# Patient Record
Sex: Female | Born: 1950
Health system: Southern US, Community
[De-identification: ages and names within clinical notes are randomized; demographics above are authoritative.]

## PROBLEM LIST (undated history)

## (undated) DIAGNOSIS — R7303 Prediabetes: Secondary | ICD-10-CM

## (undated) DIAGNOSIS — E785 Hyperlipidemia, unspecified: Secondary | ICD-10-CM

## (undated) DIAGNOSIS — N200 Calculus of kidney: Secondary | ICD-10-CM

## (undated) DIAGNOSIS — R000 Tachycardia, unspecified: Secondary | ICD-10-CM

## (undated) DIAGNOSIS — M8580 Other specified disorders of bone density and structure, unspecified site: Secondary | ICD-10-CM

## (undated) DIAGNOSIS — I8390 Asymptomatic varicose veins of unspecified lower extremity: Secondary | ICD-10-CM

## (undated) HISTORY — DX: Calculus of kidney: N20.0

## (undated) HISTORY — DX: Prediabetes: R73.03

## (undated) HISTORY — DX: Asymptomatic varicose veins of unspecified lower extremity: I83.90

## (undated) HISTORY — PX: ABDOMINAL HYSTERECTOMY: SHX81

## (undated) HISTORY — DX: Other specified disorders of bone density and structure, unspecified site: M85.80

## (undated) HISTORY — DX: Hyperlipidemia, unspecified: E78.5

## (undated) HISTORY — DX: Tachycardia, unspecified: R00.0

---
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
## 2002-10-04 ENCOUNTER — Observation Stay (HOSPITAL_COMMUNITY): Admission: EM | Admit: 2002-10-04 | Discharge: 2002-10-04 | Payer: Self-pay | Admitting: Emergency Medicine

## 2002-10-04 ENCOUNTER — Encounter: Payer: Self-pay | Admitting: Emergency Medicine

## 2004-01-23 ENCOUNTER — Ambulatory Visit (HOSPITAL_COMMUNITY): Admission: RE | Admit: 2004-01-23 | Discharge: 2004-01-23 | Payer: Self-pay | Admitting: Gastroenterology

## 2004-02-07 ENCOUNTER — Ambulatory Visit (HOSPITAL_COMMUNITY): Admission: RE | Admit: 2004-02-07 | Discharge: 2004-02-07 | Payer: Self-pay | Admitting: Obstetrics and Gynecology

## 2004-03-30 HISTORY — PX: OTHER SURGICAL HISTORY: SHX169

## 2008-03-30 HISTORY — PX: OTHER SURGICAL HISTORY: SHX169

## 2009-05-29 ENCOUNTER — Ambulatory Visit (HOSPITAL_COMMUNITY): Admission: RE | Admit: 2009-05-29 | Discharge: 2009-05-29 | Payer: Self-pay | Admitting: Urology

## 2009-06-11 ENCOUNTER — Ambulatory Visit (HOSPITAL_COMMUNITY): Admission: RE | Admit: 2009-06-11 | Discharge: 2009-06-11 | Payer: Self-pay | Admitting: Gastroenterology

## 2010-06-22 LAB — GLUCOSE, CAPILLARY: Glucose-Capillary: 112 mg/dL — ABNORMAL HIGH (ref 70–99)

## 2010-08-15 NOTE — Op Note (Signed)
Bonnie Brady, WANT NO.:  [id number]   MEDICAL RECORD NO.:  [id number]          PATIENT TYPE:  AMB   LOCATION:  ENDO                         FACILITY:  [facility]   PHYSICIAN:  Anselmo Rod, M.D.  DATE OF BIRTH:  06/06/1950   DATE OF PROCEDURE:  01/23/2004  DATE OF DISCHARGE:                                 OPERATIVE REPORT   PROCEDURE:  Screening colonoscopy.   ENDOSCOPIST:  Charna Elizabeth, M.D.   INSTRUMENT USED:  Olympus video colonoscope.   INDICATIONS FOR PROCEDURE:  60 year old white female with a history of  guaiac positive stool undergoing screening colonoscopy.  The patient has a  history of occasional diarrhea, rule out colonic polyps, masses, etc.   PREPROCEDURE PREPARATION:  Informed consent was obtained from the patient.  The patient was fasted for eight hours prior to the procedure and prepped  with a bottle of magnesium citrate and a gallon of GoLYTELY the night prior  to the procedure.   PREPROCEDURE PHYSICAL:  Patient with stable vital signs.  Neck supple.  Chest clear to auscultation.  S1 and S2 regular.  Abdomen soft with normal  bowel sounds.   DESCRIPTION OF PROCEDURE:  The patient was placed in the left lateral  decubitus position, sedated with 60 mg of Demerol and 7.5 mg Versed in slow  incremental doses.  Once the patient was adequately sedated, maintained on  low flow oxygen and continuous cardiac monitoring, the Olympus video  colonoscope was advanced from the rectum to the cecum.  Small internal  hemorrhoids were seen on retroflexion.  There were a few early left sided  diverticula, no masses or polyps were identified.  The appendiceal orifice  and ileocecal valve were clearly visualized and photographed.  The patient  tolerated the procedure well without complications.   IMPRESSION:  1.  Small internal hemorrhoids.  2.  Left sided diverticulosis (early).  3.  No masses or polyps seen.   RECOMMENDATIONS:  1.  Continue a high  fiber diet with liberal fluid intake.  2.  Repeat colonoscopy in the next ten years unless the patient develops any      abnormal symptoms in the interim.  3.  Repeat guaiacs on an outpatient basis.  4.  Brochures on diverticulosis are to be given to the patient for      education.       JNM/MEDQ  D:  01/23/2004  T:  01/23/2004  Job:  [id number]   cc:   Maxie Better, M.D.  [address]  [place]  [place] [address]  Fax: [phone]

## 2010-08-15 NOTE — H&P (Signed)
NAMEJEWELL, HAUGHT NO.:  [id number]   MEDICAL RECORD NO.:  [id number]          PATIENT TYPE:  AMB   LOCATION:  DAY                          FACILITY:  [facility]   PHYSICIAN:  Maxie Better, M.D.DATE OF BIRTH:  1950-07-22   DATE OF ADMISSION:  02/07/2004  DATE OF DISCHARGE:                                HISTORY & PHYSICAL   CHIEF COMPLAINT:  Urinary incontinence, cystocele.   HISTORY OF PRESENT ILLNESS:  A 60 year old, gravida 2, para 2, married white  female, postmenopausal, not on hormone replacement therapy, who is status  post a total vaginal hysterectomy and A&P repair secondary to uterine  prolapse in 1982, who is now being admitted for surgical management of urine  incontinence and cystocele.  The patient was evaluated by Dr. Alexis Frock.  She is now being admitted for pubovaginal sling and anterior  repair.   PAST MEDICAL HISTORY:   ALLERGIES:  POWDERED GLOVES WITH LATEX.   MEDICAL HISTORY:  1.  Hypercholesterolemia.  2.  History of angina with a negative cath.   SURGICAL HISTORY:  1.  Breast cyst aspiration.  2.  Appendectomy.  3.  Total vaginal hysterectomy.  4.  Anterior and posterior repair in 1982.  5.  Cesarean section x 2.   OBSTETRICAL HISTORY:  C-section x 2.   FAMILY HISTORY:  Breast cancer paternal grandmother, no ovarian or colon  cancer.  Mother died of a CVA and hypertension.  Father died of asbestos.   SOCIAL HISTORY:  Married, two children.  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   REVIEW OF SYSTEMS:  Negative except for loss or urine with walking or  sneezing.  Some urgency.   PHYSICAL EXAMINATION:   PHYSICAL EXAMINATION:  GENERAL:  Well-developed, well-nourished white female  in no acute distress.  VITAL SIGNS:  Blood pressure 110/70,  Weight is 134.  Height is 5 feet 7/8  inches.  SKIN:  No lesions.  HEENT:  Anicteric sclerae.  Pink conjunctivae.  Oropharynx negative.  HEART:  Regular rate and rhythm  without murmur.  LUNGS:  Clear to auscultation.  NECK:  Supple.  Thyroid not palpable.  NODES:  No supraclavicular, axillary nodes palpable.  ABDOMEN:  Soft, nontender.  Healed transverse and low vertical scars.  BACK:  No CVA tenderness.  BREASTS:  Well-circumscribed bilateral cystic lumps, one at 12 o'clock on  the right and the other one on the left at 3 o'clock.  PELVIC:  Vulva shows no lesions.  Vagina had a small cystocele.  Surgical  absence of cervix and uterus.  Adnexa nontender, no palpable mass.  RECTAL:  Normal tone.  Negative rectovaginal septum.  Soft stool.   IMPRESSION:  1.  Cystocele.  2.  Stress urinary incontinence.   PLAN:  1.  Admission.  2.  Anterior repair.  3.  Pubovaginal sling by Dr. Patsi Sears.   Risks of the procedure explained to the patient including, but not limited  to, infection, injury to bladder, surrounding organ structure, urinary  retention, possible need for this same surgery in the future, bleeding.  All  questions answered.  Routine admission labs planned.      [place]/MEDQ  D:  02/06/2004  T:  02/06/2004  Job:  [id number]

## 2010-08-15 NOTE — Op Note (Signed)
NAMECHELSEY, [name]:  [id number]   MEDICAL RECORD NO.:  [id number]          PATIENT TYPE:  AMB   LOCATION:  DAY                          FACILITY:  [facility]   PHYSICIAN:  Sigmund I. Patsi Sears, M.D.DATE OF BIRTH:  [birth year]   DATE OF PROCEDURE:  02/07/2004  DATE OF DISCHARGE:                                 OPERATIVE REPORT   PREOPERATIVE DIAGNOSES:  Urinary incontinence.   POSTOPERATIVE DIAGNOSES:  Urinary incontinence.   OPERATION:  [facility] AMS pubovaginal transobturator sling.   SURGEON:  Sigmund I. Patsi Sears, M.D.   FIRST ASSISTANT:  Maxie Better, M.D.   PREPARATION:  With the patient in the dorsal lithotomy position, following  anterior vaginal vault repair, the urethra was anesthetized with 3 mL of 1%  Xylocaine with 1:100,000 epinephrine. A midline incision was then made over  the urethra, subcutaneous tissue was dissected bilaterally to the level of  the retropubic fascia. Two separate stab wounds were then made 5 cm lateral  to the clitoris, at the level of the obturator bone.  In standard fashion,  the anus transobturator [facility] pubovaginal sling is placed without  difficulty.  With the right angled clamp behind it, each wing is then cut,  and antibiotic solution introduced. Each wing is then removed and prior to  cutting the wings, cystoscopy is accomplished, and a normal bladder is  found. There is no evidence of any trauma to the bladder. Foley catheters  were placed, the wings cut subcutaneously. The wound is closed in two layers  with interrupted 3-0 Vicryl suture. Foley catheter remained in place, but  will be removed in the recovery room.  The patient was awakened and was  given IV Toradol, taken to the recovery room in good condition.     Sigm   SIT/MEDQ  D:  02/07/2004  T:  02/07/2004  Job:  [id number]   cc:   Maxie Better, M.D.  [address]  [place]  [place] [address]  Fax: [phone]

## 2010-08-15 NOTE — Op Note (Signed)
NAMEEADIE, REPETTO NO.:  [id number]   MEDICAL RECORD NO.:  [id number]          PATIENT TYPE:  AMB   LOCATION:  DAY                          [facility]ACILITY[facility]  [facility]   PHYSICIAN:  Maxie Better, M.D.DATE OF BIRTH:  [birth year]   DATE OF PROCEDURE:  02/07/2004  DATE OF DISCHARGE:                                 OPERATIVE REPORT   PREOPERATIVE DIAGNOSIS:  Cystocele, stress urinary incontinence.   PROCEDURE:  Anterior colporrhaphy.   POSTOPERATIVE DIAGNOSIS:  Cystocele, stress urinary incontinence.   ANESTHESIA:  General.   SURGEON:  Maxie Better, M.D.   ASSISTANT:  Gerri Spore B. Earlene Plater, M.D.   PROCEDURE:  Under adequate general anesthesia, the patient was placed in a  dorsal lithotomy position.  She was sterilely prepped and draped in the  usual fashion.  An indwelling Foley catheter was thoroughly placed.  First,  a second-degree cystocele was noted in the posterior aspect of the anterior  wall of the vagina.  Surgical absence of cervix and uterus was noted.  The  lateral dimples of the vaginal cuff was identified.  Lidocaine 1% with  epinephrine was then injected transversely and vertically.  A transverse  incision was made between the dimple of the vaginal cuff and then a vertical  incision was made anteriorly from there overlying the cystocele.  The  bladder was then bluntly dissected off the vaginal mucosa laterally on both  sides.  When this was done, the vertical incision was carried up to about  1.5 cm from the urethra in order for Dr. Patsi Sears to do his portion of the  procedure.  The endofascia was approximated in the midline using 0 Vicryl  sutures.  The ex-redundant vaginal mucosa was then cut, and the vaginal  mucosa was then approximated using 0 Monocryl interrupted figure-of-eight  sutures and with transverse closure, again, of the vaginal cuff anteriorly.  At that point, at the completion of the procedure from the anterior repair,  Dr.  Patsi Sears proceed with his pubovaginal sling.  Please see his dictated  operative note for the details.   SPECIMENS:  Vaginal mucosa, not sent to pathology.   ESTIMATED BLOOD LOSS:  Minimal.   COMPLICATIONS:  None.   SPONGE COUNT:  Correct.   Patient tolerated the procedure well.  Was transferred to the recovery room  in stable condition.      [place]/MEDQ  D:  02/07/2004  T:  02/07/2004  Job:  [id number]

## 2010-08-15 NOTE — Discharge Summary (Signed)
NAME:  Bonnie Brady, Bonnie Brady                         ACCOUNT NO.:  [id number]   MEDICAL RECORD NO.:  [id number]                   PATIENT TYPE:  INP   LOCATION:  3727                                 FACILITY:  [facility]   PHYSICIAN:  Bonnie Brady, M.D.             DATE OF BIRTH:  11/13/1950   DATE OF ADMISSION:  10/04/2002  DATE OF DISCHARGE:  10/04/2002                                 DISCHARGE SUMMARY   DISCHARGE DIAGNOSES:  1. Chest pain, rule out cardiac.     a. Nonobstructive coronary artery disease.  2. Tobacco use.  3. Possible hyperlipidemia.  4. Tachycardia, resolved.  5. Hypotension, mild, resolved.  6. History of esophagitis.  7. History of a superficial vein thrombus with first pregnancy.   DISCHARGE CONDITION:  Improved.   PROCEDURES:  Combined left heart catheterization October 04, 2002 by Dr. Lenise Brady.   DISCHARGE MEDICATIONS:  1. Wellbutrin SR 150 mg 1 daily for three days then 1 twice a day.  2. Nexium 40 mg 1 daily.  3. Enteric-coated aspirin daily.  4. No smoking after #10 on Wellbutrin.   No strenuous activity, no sexual activity, no lifting over 10 pounds for  three days then may resume regular activity.   Low-fat/low-salt diet.   May take dressing off groin in the morning.  Wash catheterization site in  shower with soap and water.  If any bleeding, swelling, or drainage occurs  please call.  Do not take a sit-down bath for one week.  Do not swim for one  week.   Follow up with Dr. Jenne Brady October 24, 2002 at 4:30 p.m.   HISTORY OF PRESENT ILLNESS:  A 60 year old white female with no prior  cardiac history and no significant medical history with positive tobacco use  presented to the emergency room October 04, 2002 with complaints of chest pain.   She was awakened at 5 a.m. with chest pain, tightness, and midsternal chest  burning sensation radiating from that area.  It is different from the  burping she normally has with esophagitis.  No radiation.   The burning  continued the entire time.  The tightness would come and go.  She kept  hoping that the symptoms would resolve but soon after onset of chest pain  heart rate went up and no associated symptoms of shortness of breath,  diaphoresis, nausea, or vomiting.  She took two aspirin, came to the  emergency room, took a sublingual nitroglycerin in the ER, and tightness  resolved.  The burning sensation continued.   Over the prior couple of weeks, similar symptoms but less duration and not  occurred with exertion.   PAST MEDICAL HISTORY:  1. Possible hyperlipidemia  2. History of T&A.  3. History of C-section x2.  4. Superficial phlebitis with first pregnancy.  5. History of hysterectomy.  6. Hysterectomy of appendectomy.   OUTPATIENT MEDICATIONS:  Multivitamins.  No herbs,  diet pills, etc.   ALLERGIES:  AMPICILLIN.   FAMILY HISTORY/SOCIAL HISTORY/REVIEW OF SYSTEMS:  See H&P.   PHYSICAL EXAMINATION AT DISCHARGE:  VITAL SIGNS:  Blood pressure 110/88,  pulse 80, respirations 16, temperature 97.6, room air oxygen saturation 97%.  GENERAL:  Alert, oriented female.  No acute distress.  SKIN:  Warm and dry.  LUNGS:  Clear without rales, rhonchi, or wheezes.  EXTREMITIES:  Pedal pulses are present.  No hematoma at right groin  catheterization site.   LABORATORY DATA:  Sodium 139, potassium 3.7, BUN 11, creatinine 0.9, glucose  127.  CK 164, MB 2.2, troponin 0.02.   EKG:  Sinus tachycardia at 103, nonspecific ST-T wave changes, poor anterior  R-wave progression.   Cardiac catheterization revealed nonobstructive coronary disease, normal LV  systolic function, no evidence of aortic dissection, successful closure of  the right groin using a Perclose device and 1 g of Ancef was given  prophylactically.  She had 40% stenosis in the mid LAD.   HOSPITAL COURSE:  The patient came to the emergency room October 04, 2002, was  seen by Dr. Jenne Brady in the emergency room due to her history of  chest pain.  It was extremely worrisome for angina.  She was able to go to the cardiac  catheterization lab electively for cardiac catheterization.  Procedure  without complications, stated results as above.  She then went to short-stay  and was discharged home without further complications.  She will follow up  as an outpatient.  Discharged by Dr. Jenne Brady.      Bonnie Brady. Bonnie Brady, M.D.    LRI/MEDQ  D:  11/14/2002  T:  11/15/2002  Job:  [id number]   cc:   Dr. Samson Brady

## 2010-08-15 NOTE — Cardiovascular Report (Signed)
NAME:  Bonnie Brady, Bonnie Brady                         ACCOUNT NO.:  [id number]   MEDICAL RECORD NO.:  [id number]                   PATIENT TYPE:  INP   LOCATION:  3727                                 FACILITY:  [facility]   PHYSICIAN:  Darlin Priestly, M.D.             DATE OF BIRTH:  06/11/1950   DATE OF PROCEDURE:  10/04/2002  DATE OF DISCHARGE:                              CARDIAC CATHETERIZATION   PROCEDURES PERFORMED:  1. Left heart catheterization.  2. Coronary angiography.  3. Left ventriculogram.  4. Ascending aortography.   CARDIOLOGIST:  Darlin Priestly, M.D.   COMPLICATIONS:  None.   INDICATIONS:  Bonnie Brady is a 60 year old female who presented to the ER on  October 04, 2002 with the complaint of substernal chest pain, which awoke her  at 7 A.M.  Chest pain did radiate across her chest, but did not migrate into  her neck, arm, jaw or shoulder.  She had no associated shortness of breath,  nausea or diaphoresis with this episode.  She subsequently presented to the  [facility] ER where she was noted to have septal P waves and was given  sublingual nitro with relief of her symptoms.  She is now referred for  cardiac catheterization to assess her coronary status.   The patient does have a history of longstanding tobacco use.   DESCRIPTION OF PROCEDURE:  After getting informed written consent the  patient was brought to the cardiac cath lab.  The right and left groins were  shaved, prepped and draped in the usual sterile fashion.  ECG monitoring was  established.  Using the modified Seldinger technique a #6 French arterial  sheath was inserted into the right femoral artery.  A 6 French  diagnostic  catheter was used to perform diagnostic angiography.  This revealed a large  left main with no significant disease.   The LAD is a large vessel, which courses the apex and gives rise to two  diagonal branches.  The LAD is noted to have some mild 40% mid and early  distal stenosis.   The first diagonal is a medium-sized vessel, which  bifurcates distally and has a 40% ostial lesion.  The second diagonal is a  medium-sized vessel, which bifurcates distally and has 40% ostial stenosis.   The left circumflex is a medium-sized vessel, which courses the AV groove  and gives rise to three obtuse marginal branches.  The AV groove circ has no  significant disease.  The first OM is a small vessel with no significant  disease.  The second OM is a medium-size vessel, which bifurcates in the mid  segment and has no significant disease.  The third OM is a small vessel with  no significant disease.   The right coronary artery is a large vessel, which is dominant and gives  rise to both the PDA as well as the posterolateral branch.  There is no  significant disease in the RCA, PDA or posterolateral branch.   Left ventriculogram reveals a preserved EF of 50%.   Ascending aortography reveals no evidence of aortic dissection or  significant aortic regurgitation.   HEMODYNAMIC DATA:  Systemic arterial pressure 119/73.  LV systemic pressure  117/3.  LVEDP of 9.   Following the case the right femoral site was then closed using the Perclose  device without complications.  One gram of Ancef was given prophylactically.   CONCLUSIONS:  1. No significant coronary artery disease.  2. Normal left ventricular systolic function.  3. No evidence of aortic dissection.  4. Successful closure of the right groin using a Perclose device with 1 gram     of Ancef prophylactically.                                                 Darlin Priestly, M.D.    RHM/MEDQ  D:  10/04/2002  T:  10/04/2002  Job:  [id number]

## 2011-11-16 ENCOUNTER — Encounter: Payer: Self-pay | Admitting: Family

## 2011-11-16 ENCOUNTER — Ambulatory Visit (INDEPENDENT_AMBULATORY_CARE_PROVIDER_SITE_OTHER): Payer: 59 | Admitting: Family

## 2011-11-16 VITALS — BP 122/82 | HR 84 | Temp 97.9°F | Resp 14 | Ht 66.75 in | Wt 173.8 lb

## 2011-11-16 DIAGNOSIS — R9431 Abnormal electrocardiogram [ECG] [EKG]: Secondary | ICD-10-CM

## 2011-11-16 DIAGNOSIS — Z0000 Encounter for general adult medical examination without abnormal findings: Secondary | ICD-10-CM | POA: Insufficient documentation

## 2011-11-16 DIAGNOSIS — Z23 Encounter for immunization: Secondary | ICD-10-CM

## 2011-11-16 DIAGNOSIS — Z2911 Encounter for prophylactic immunotherapy for respiratory syncytial virus (RSV): Secondary | ICD-10-CM

## 2011-11-16 NOTE — Progress Notes (Signed)
Subjective:    Patient ID: Bonnie Brady, female    DOB: 05-May-1950, 61 y.o.   MRN: [id number]  HPI  Pt presents today to establish care.  Preventative-  + exercise, total gym and walking.  Diet is "terrible."  Eats too much junk food.  Had Mammogram 1/13 per GYN. Thinks last tetanus was 05/2003.    Hyperlipidemia- reports that she was on lipitor once. Tolerated.    Kidney stones- reports one  Kidney stone in 1979.   Pt had hysterectomy in her 30's.  Due to Dysfunctional Uterine bleeding.   Hx of basal joint arthritis- sees Dr. Amanda Pea Review of Systems  Constitutional: Negative for unexpected weight change.  HENT: Negative for hearing loss and congestion.   Eyes: Negative for visual disturbance.  Respiratory: Negative for cough and shortness of breath.   Cardiovascular: Negative for chest pain and leg swelling.  Gastrointestinal: Negative for nausea, vomiting, diarrhea and blood in stool.  Genitourinary: Negative for dysuria and frequency.  Musculoskeletal: Negative for myalgias and arthralgias.  Skin: Negative for rash.  Neurological: Negative for headaches.  Hematological: Negative for adenopathy.  Psychiatric/Behavioral:       Denies depression/anxiety   No past medical history on file.  History   Social History  . Marital Status: Married    Spouse Name: N/A    Number of Children: N/A  . Years of Education: N/A   Occupational History  . Not on file.   Social History Main Topics  . Smoking status: Former Smoker    Types: Cigarettes    Quit date: 03/30/2008  . Smokeless tobacco: Not on file  . Alcohol Use: Not on file  . Drug Use: Not on file  . Sexually Active: Not on file   Other Topics Concern  . 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 Married2 childrenEnjoys geneologyCamping/hiking    Past Surgical History  Procedure Date  . Bladder strap 2006  . Stripping varicose veins 2010    . Tonsillectomy 1958  . Appendectomy 1971  . Cesarean section 1978 and 1979    Family History  Problem Relation Age of Onset  . Breast cancer Paternal Grandmother   . Stroke Mother   . Stroke Maternal Grandfather     Allergies  Allergen Reactions  . Ampicillin Hives    Questionable.  . Latex Other (See Comments)    Contact with powder.    No current outpatient prescriptions on file prior to visit.    BP 122/82  Pulse 84  Temp 97.9 F (36.6 C) (Oral)  Resp 14  Ht 5' 6.75" (1.695 m)  Wt 173 lb 12 oz (78.812 kg)  BMI 27.42 kg/m2  SpO2 96%       Objective:   Physical Exam  Physical Exam  Constitutional: She is oriented to person, place, and time. She appears well-developed and well-nourished. No distress.  HENT:  Head: Normocephalic and atraumatic.  Right Ear: Tympanic membrane and ear canal normal.  Left Ear: Tympanic membrane and ear canal normal.  Mouth/Throat: Oropharynx is clear and moist.  Eyes: Pupils are equal, round, and reactive to light. No scleral icterus.  Neck: Normal range of motion. No thyromegaly present.  Cardiovascular: Normal rate and regular rhythm.   No murmur heard. Pulmonary/Chest: Effort normal and breath sounds normal. No respiratory distress. He has no wheezes. She has no rales. She exhibits no tenderness.  Abdominal: Soft. Bowel sounds are normal.  He exhibits no distension and no mass. There is no tenderness. There is no rebound and no guarding.  Musculoskeletal: She exhibits no edema.  Lymphadenopathy:    She has no cervical adenopathy.  Neurological: She is alert and oriented to person, place, and time. She has normal reflexes. She exhibits normal muscle tone. Coordination normal.  Skin: Skin is warm and dry.  Psychiatric: She has a normal mood and affect. Her behavior is normal. Judgment and thought content normal.  Breast/pelvic- deferred to gyn         Assessment & Plan:         Assessment & Plan:

## 2011-11-16 NOTE — Assessment & Plan Note (Signed)
EKG is personally reviewed.  Noted ST elevation in V1 only.  Pt is completely asymptomatic.  No EKG available for comparison.  Will order stress test.

## 2011-11-16 NOTE — Patient Instructions (Addendum)
Please return fasting for lab work.    Preventive Care for Adults, Female A healthy lifestyle and preventive care can promote health and wellness. Preventive health guidelines for women include the following key practices.  A routine yearly physical is a good way to check with your caregiver about your health and preventive screening. It is a chance to share any concerns and updates on your health, and to receive a thorough exam.   Visit your dentist for a routine exam and preventive care every 6 months. Brush your teeth twice a day and floss once a day. Good oral hygiene prevents tooth decay and gum disease.   The frequency of eye exams is based on your age, health, family medical history, use of contact lenses, and other factors. Follow your caregiver's recommendations for frequency of eye exams.   Eat a healthy diet. Foods like vegetables, fruits, whole grains, low-fat dairy products, and lean protein foods contain the nutrients you need without too many calories. Decrease your intake of foods high in solid fats, added sugars, and salt. Eat the right amount of calories for you.Get information about a proper diet from your caregiver, if necessary.   Regular physical exercise is one of the most important things you can do for your health. Most adults should get at least 150 minutes of moderate-intensity exercise (any activity that increases your heart rate and causes you to sweat) each week. In addition, most adults need muscle-strengthening exercises on 2 or more days a week.   Maintain a healthy weight. The body mass index (BMI) is a screening tool to identify possible weight problems. It provides an estimate of body fat based on height and weight. Your caregiver can help determine your BMI, and can help you achieve or maintain a healthy weight.For adults 20 years and older:   A BMI below 18.5 is considered underweight.   A BMI of 18.5 to 24.9 is normal.   A BMI of 25 to 29.9 is considered  overweight.   A BMI of 30 and above is considered obese.   Maintain normal blood lipids and cholesterol levels by exercising and minimizing your intake of saturated fat. Eat a balanced diet with plenty of fruit and vegetables. Blood tests for lipids and cholesterol should begin at age 69 and be repeated every 5 years. If your lipid or cholesterol levels are high, you are over 50, or you are at high risk for heart disease, you may need your cholesterol levels checked more frequently.Ongoing high lipid and cholesterol levels should be treated with medicines if diet and exercise are not effective.   If you smoke, find out from your caregiver how to quit. If you do not use tobacco, do not start.   If you are pregnant, do not drink alcohol. If you are breastfeeding, be very cautious about drinking alcohol. If you are not pregnant and choose to drink alcohol, do not exceed 1 drink per day. One drink is considered to be 12 ounces (355 mL) of beer, 5 ounces (148 mL) of wine, or 1.5 ounces (44 mL) of liquor.   Avoid use of street drugs. Do not share needles with anyone. Ask for help if you need support or instructions about stopping the use of drugs.   High blood pressure causes heart disease and increases the risk of stroke. Your blood pressure should be checked at least every 1 to 2 years. Ongoing high blood pressure should be treated with medicines if weight loss and exercise are not  effective.   If you are 51 to 60 years old, ask your caregiver if you should take aspirin to prevent strokes.   Diabetes screening involves taking a blood sample to check your fasting blood sugar level. This should be done once every 3 years, after age 64, if you are within normal weight and without risk factors for diabetes. Testing should be considered at a younger age or be carried out more frequently if you are overweight and have at least 1 risk factor for diabetes.   Breast cancer screening is essential preventive  care for women. You should practice "breast self-awareness." This means understanding the normal appearance and feel of your breasts and may include breast self-examination. Any changes detected, no matter how small, should be reported to a caregiver. Women in their 24s and 30s should have a clinical breast exam (CBE) by a caregiver as part of a regular health exam every 1 to 3 years. After age 26, women should have a CBE every year. Starting at age 37, women should consider having a mammography (breast X-ray test) every year. Women who have a family history of breast cancer should talk to their caregiver about genetic screening. Women at a high risk of breast cancer should talk to their caregivers about having magnetic resonance imaging (MRI) and a mammography every year.   The Pap test is a screening test for cervical cancer. A Pap test can show cell changes on the cervix that might become cervical cancer if left untreated. A Pap test is a procedure in which cells are obtained and examined from the lower end of the uterus (cervix).   Women should have a Pap test starting at age 8.   Between ages 31 and 84, Pap tests should be repeated every 2 years.   Beginning at age 78, you should have a Pap test every 3 years as long as the past 3 Pap tests have been normal.   Some women have medical problems that increase the chance of getting cervical cancer. Talk to your caregiver about these problems. It is especially important to talk to your caregiver if a new problem develops soon after your last Pap test. In these cases, your caregiver may recommend more frequent screening and Pap tests.   The above recommendations are the same for women who have or have not gotten the vaccine for human papillomavirus (HPV).   If you had a hysterectomy for a problem that was not cancer or a condition that could lead to cancer, then you no longer need Pap tests. Even if you no longer need a Pap test, a regular exam is a  good idea to make sure no other problems are starting.   If you are between ages 70 and 34, and you have had normal Pap tests going back 10 years, you no longer need Pap tests. Even if you no longer need a Pap test, a regular exam is a good idea to make sure no other problems are starting.   If you have had past treatment for cervical cancer or a condition that could lead to cancer, you need Pap tests and screening for cancer for at least 20 years after your treatment.   If Pap tests have been discontinued, risk factors (such as a new sexual partner) need to be reassessed to determine if screening should be resumed.   The HPV test is an additional test that may be used for cervical cancer screening. The HPV test looks for the virus  that can cause the cell changes on the cervix. The cells collected during the Pap test can be tested for HPV. The HPV test could be used to screen women aged 68 years and older, and should be used in women of any age who have unclear Pap test results. After the age of 51, women should have HPV testing at the same frequency as a Pap test.   Colorectal cancer can be detected and often prevented. Most routine colorectal cancer screening begins at the age of 70 and continues through age 48. However, your caregiver may recommend screening at an earlier age if you have risk factors for colon cancer. On a yearly basis, your caregiver may provide home test kits to check for hidden blood in the stool. Use of a small camera at the end of a tube, to directly examine the colon (sigmoidoscopy or colonoscopy), can detect the earliest forms of colorectal cancer. Talk to your caregiver about this at age 44, when routine screening begins. Direct examination of the colon should be repeated every 5 to 10 years through age 76, unless early forms of pre-cancerous polyps or small growths are found.   Hepatitis C blood testing is recommended for all people born from [birth year] through 1965 and any  individual with known risks for hepatitis C.   Practice safe sex. Use condoms and avoid high-risk sexual practices to reduce the spread of sexually transmitted infections (STIs). STIs include gonorrhea, chlamydia, syphilis, trichomonas, herpes, HPV, and human immunodeficiency virus (HIV). Herpes, HIV, and HPV are viral illnesses that have no cure. They can result in disability, cancer, and death. Sexually active women aged 53 and younger should be checked for chlamydia. Older women with new or multiple partners should also be tested for chlamydia. Testing for other STIs is recommended if you are sexually active and at increased risk.   Osteoporosis is a disease in which the bones lose minerals and strength with aging. This can result in serious bone fractures. The risk of osteoporosis can be identified using a bone density scan. Women ages 66 and over and women at risk for fractures or osteoporosis should discuss screening with their caregivers. Ask your caregiver whether you should take a calcium supplement or vitamin D to reduce the rate of osteoporosis.   Menopause can be associated with physical symptoms and risks. Hormone replacement therapy is available to decrease symptoms and risks. You should talk to your caregiver about whether hormone replacement therapy is right for you.   Use sunscreen with sun protection factor (SPF) of 30 or more. Apply sunscreen liberally and repeatedly throughout the day. You should seek shade when your shadow is shorter than you. Protect yourself by wearing long sleeves, pants, a wide-brimmed hat, and sunglasses year round, whenever you are outdoors.   Once a month, do a whole body skin exam, using a mirror to look at the skin on your back. Notify your caregiver of new moles, moles that have irregular borders, moles that are larger than a pencil eraser, or moles that have changed in shape or color.   Stay current with required immunizations.   Influenza. You need a  dose every fall (or winter). The composition of the flu vaccine changes each year, so being vaccinated once is not enough.   Pneumococcal polysaccharide. You need 1 to 2 doses if you smoke cigarettes or if you have certain chronic medical conditions. You need 1 dose at age 58 (or older) if you have never been vaccinated.  Tetanus, diphtheria, pertussis (Tdap, Td). Get 1 dose of Tdap vaccine if you are younger than age 41, are over 31 and have contact with an infant, are a Research scientist (physical sciences), are pregnant, or simply want to be protected from whooping cough. After that, you need a Td booster dose every 10 years. Consult your caregiver if you have not had at least 3 tetanus and diphtheria-containing shots sometime in your life or have a deep or dirty wound.   HPV. You need this vaccine if you are a woman age 86 or younger. The vaccine is given in 3 doses over 6 months.   Measles, mumps, rubella (MMR). You need at least 1 dose of MMR if you were born in 1957 or later. You may also need a second dose.   Meningococcal. If you are age 7 to 21 and a first-year college student living in a residence hall, or have one of several medical conditions, you need to get vaccinated against meningococcal disease. You may also need additional booster doses.   Zoster (shingles). If you are age 54 or older, you should get this vaccine.   Varicella (chickenpox). If you have never had chickenpox or you were vaccinated but received only 1 dose, talk to your caregiver to find out if you need this vaccine.   Hepatitis A. You need this vaccine if you have a specific risk factor for hepatitis A virus infection or you simply wish to be protected from this disease. The vaccine is usually given as 2 doses, 6 to 18 months apart.   Hepatitis B. You need this vaccine if you have a specific risk factor for hepatitis B virus infection or you simply wish to be protected from this disease. The vaccine is given in 3 doses, usually over  6 months.  Preventive Services / Frequency Ages 28 to 67  Blood pressure check.** / Every 1 to 2 years.   Lipid and cholesterol check.** / Every 5 years beginning at age 55.   Clinical breast exam.** / Every 3 years for women in their 56s and 30s.   Pap test.** / Every 2 years from ages 70 through 45. Every 3 years starting at age 73 through age 9 or 39 with a history of 3 consecutive normal Pap tests.   HPV screening.** / Every 3 years from ages 60 through ages 72 to 37 with a history of 3 consecutive normal Pap tests.   Hepatitis C blood test.** / For any individual with known risks for hepatitis C.   Skin self-exam. / Monthly.   Influenza immunization.** / Every year.   Pneumococcal polysaccharide immunization.** / 1 to 2 doses if you smoke cigarettes or if you have certain chronic medical conditions.   Tetanus, diphtheria, pertussis (Tdap, Td) immunization. / A one-time dose of Tdap vaccine. After that, you need a Td booster dose every 10 years.   HPV immunization. / 3 doses over 6 months, if you are 65 and younger.   Measles, mumps, rubella (MMR) immunization. / You need at least 1 dose of MMR if you were born in 1957 or later. You may also need a second dose.   Meningococcal immunization. / 1 dose if you are age 18 to 28 and a first-year college student living in a residence hall, or have one of several medical conditions, you need to get vaccinated against meningococcal disease. You may also need additional booster doses.   Varicella immunization.** / Consult your caregiver.   Hepatitis A immunization.** /  Consult your caregiver. 2 doses, 6 to 18 months apart.   Hepatitis B immunization.** / Consult your caregiver. 3 doses usually over 6 months.  Ages 57 to 46  Blood pressure check.** / Every 1 to 2 years.   Lipid and cholesterol check.** / Every 5 years beginning at age 52.   Clinical breast exam.** / Every year after age 46.   Mammogram.** / Every year beginning  at age 11 and continuing for as long as you are in good health. Consult with your caregiver.   Pap test.** / Every 3 years starting at age 81 through age 46 or 35 with a history of 3 consecutive normal Pap tests.   HPV screening.** / Every 3 years from ages 83 through ages 36 to 62 with a history of 3 consecutive normal Pap tests.   Fecal occult blood test (FOBT) of stool. / Every year beginning at age 3 and continuing until age 57. You may not need to do this test if you get a colonoscopy every 10 years.   Flexible sigmoidoscopy or colonoscopy.** / Every 5 years for a flexible sigmoidoscopy or every 10 years for a colonoscopy beginning at age 52 and continuing until age 97.   Hepatitis C blood test.** / For all people born from [birth year] through 1965 and any individual with known risks for hepatitis C.   Skin self-exam. / Monthly.   Influenza immunization.** / Every year.   Pneumococcal polysaccharide immunization.** / 1 to 2 doses if you smoke cigarettes or if you have certain chronic medical conditions.   Tetanus, diphtheria, pertussis (Tdap, Td) immunization.** / A one-time dose of Tdap vaccine. After that, you need a Td booster dose every 10 years.   Measles, mumps, rubella (MMR) immunization. / You need at least 1 dose of MMR if you were born in 1957 or later. You may also need a second dose.   Varicella immunization.** / Consult your caregiver.   Meningococcal immunization.** / Consult your caregiver.   Hepatitis A immunization.** / Consult your caregiver. 2 doses, 6 to 18 months apart.   Hepatitis B immunization.** / Consult your caregiver. 3 doses, usually over 6 months.  Ages 81 and over  Blood pressure check.** / Every 1 to 2 years.   Lipid and cholesterol check.** / Every 5 years beginning at age 62.   Clinical breast exam.** / Every year after age 31.   Mammogram.** / Every year beginning at age 58 and continuing for as long as you are in good health. Consult with your  caregiver.   Pap test.** / Every 3 years starting at age 95 through age 16 or 75 with a 3 consecutive normal Pap tests. Testing can be stopped between 65 and 70 with 3 consecutive normal Pap tests and no abnormal Pap or HPV tests in the past 10 years.   HPV screening.** / Every 3 years from ages 79 through ages 33 or 63 with a history of 3 consecutive normal Pap tests. Testing can be stopped between 65 and 70 with 3 consecutive normal Pap tests and no abnormal Pap or HPV tests in the past 10 years.   Fecal occult blood test (FOBT) of stool. / Every year beginning at age 85 and continuing until age 34. You may not need to do this test if you get a colonoscopy every 10 years.   Flexible sigmoidoscopy or colonoscopy.** / Every 5 years for a flexible sigmoidoscopy or every 10 years for a colonoscopy beginning at age  50 and continuing until age 66.   Hepatitis C blood test.** / For all people born from [birth year] through 1965 and any individual with known risks for hepatitis C.   Osteoporosis screening.** / A one-time screening for women ages 57 and over and women at risk for fractures or osteoporosis.   Skin self-exam. / Monthly.   Influenza immunization.** / Every year.   Pneumococcal polysaccharide immunization.** / 1 dose at age 64 (or older) if you have never been vaccinated.   Tetanus, diphtheria, pertussis (Tdap, Td) immunization. / A one-time dose of Tdap vaccine if you are over 65 and have contact with an infant, are a Research scientist (physical sciences), or simply want to be protected from whooping cough. After that, you need a Td booster dose every 10 years.   Varicella immunization.** / Consult your caregiver.   Meningococcal immunization.** / Consult your caregiver.   Hepatitis A immunization.** / Consult your caregiver. 2 doses, 6 to 18 months apart.   Hepatitis B immunization.** / Check with your caregiver. 3 doses, usually over 6 months.  ** Family history and personal history of risk and conditions  may change your caregiver's recommendations. Document Released: 05/12/2001 Document Revised: 03/05/2011 Document Reviewed: 08/11/2010 [facility] Patient Information 2012 [place], [place].

## 2011-11-16 NOTE — Assessment & Plan Note (Signed)
Pt counseled on healthy diet, exercise weight loss.  Zostavax today. EKG today.  Pt to return fasting for lab work .

## 2011-11-18 LAB — HEPATIC FUNCTION PANEL
ALT: 21 U/L (ref 0–35)
AST: 18 U/L (ref 0–37)
Albumin: 4.4 g/dL (ref 3.5–5.2)
Alkaline Phosphatase: 91 U/L (ref 39–117)
Bilirubin, Direct: 0.1 mg/dL (ref 0.0–0.3)
Indirect Bilirubin: 0.6 mg/dL (ref 0.0–0.9)
Total Bilirubin: 0.7 mg/dL (ref 0.3–1.2)
Total Protein: 6.7 g/dL (ref 6.0–8.3)

## 2011-11-18 LAB — LIPID PANEL
Cholesterol: 212 mg/dL — ABNORMAL HIGH (ref 0–200)
HDL: 41 mg/dL (ref >39)
LDL Cholesterol: 136 mg/dL — ABNORMAL HIGH (ref 0–99)
Total CHOL/HDL Ratio: 5.2 Ratio
Triglycerides: 173 mg/dL — ABNORMAL HIGH (ref <150)
VLDL: 35 mg/dL (ref 0–40)

## 2011-11-18 LAB — BASIC METABOLIC PANEL
BUN: 19 mg/dL (ref 6–23)
CO2: 26 mEq/L (ref 19–32)
Calcium: 9.8 mg/dL (ref 8.4–10.5)
Chloride: 106 mEq/L (ref 96–112)
Creat: 0.92 mg/dL (ref 0.50–1.10)
Glucose, Bld: 97 mg/dL (ref 70–99)
Potassium: 4.2 mEq/L (ref 3.5–5.3)
Sodium: 140 mEq/L (ref 135–145)

## 2011-11-18 LAB — TSH: TSH: 1.976 u[IU]/mL (ref 0.350–4.500)

## 2011-11-18 LAB — CBC WITH DIFFERENTIAL/PLATELET
Basophils Absolute: 0 10*3/uL (ref 0.0–0.1)
Basophils Relative: 1 % (ref 0–1)
Eosinophils Absolute: 0.1 10*3/uL (ref 0.0–0.7)
Eosinophils Relative: 1 % (ref 0–5)
HCT: 45.1 % (ref 36.0–46.0)
Hemoglobin: 15.3 g/dL — ABNORMAL HIGH (ref 12.0–15.0)
Lymphocytes Relative: 18 % (ref 12–46)
Lymphs Abs: 1.2 10*3/uL (ref 0.7–4.0)
MCH: 30.9 pg (ref 26.0–34.0)
MCHC: 33.9 g/dL (ref 30.0–36.0)
MCV: 91.1 fL (ref 78.0–100.0)
Monocytes Absolute: 0.6 10*3/uL (ref 0.1–1.0)
Monocytes Relative: 9 % (ref 3–12)
Neutro Abs: 5 10*3/uL (ref 1.7–7.7)
Neutrophils Relative %: 71 % (ref 43–77)
Platelets: 229 10*3/uL (ref 150–400)
RBC: 4.95 MIL/uL (ref 3.87–5.11)
RDW: 13.7 % (ref 11.5–15.5)
WBC: 7 10*3/uL (ref 4.0–10.5)

## 2011-11-19 LAB — URINALYSIS, MICROSCOPIC ONLY
Bacteria, UA: NONE SEEN
Casts: NONE SEEN
Crystals: NONE SEEN

## 2011-11-19 LAB — URINALYSIS, ROUTINE W REFLEX MICROSCOPIC
Bilirubin Urine: NEGATIVE
Glucose, UA: NEGATIVE mg/dL
Hgb urine dipstick: NEGATIVE
Ketones, ur: NEGATIVE mg/dL
Leukocytes, UA: NEGATIVE
Nitrite: NEGATIVE
Protein, ur: NEGATIVE mg/dL
Specific Gravity, Urine: 1.021 (ref 1.005–1.030)
Urobilinogen, UA: 0.2 mg/dL (ref 0.0–1.0)
pH: 5.5 (ref 5.0–8.0)

## 2011-11-19 LAB — VITAMIN D 25 HYDROXY (VIT D DEFICIENCY, FRACTURES): Vit D, 25-Hydroxy: 45 ng/mL (ref 30–89)

## 2011-11-20 ENCOUNTER — Encounter: Payer: Self-pay | Admitting: Family

## 2011-11-23 ENCOUNTER — Ambulatory Visit (INDEPENDENT_AMBULATORY_CARE_PROVIDER_SITE_OTHER)
Admission: RE | Admit: 2011-11-23 | Discharge: 2011-11-23 | Disposition: A | Discharge status: Home / Self Care | Payer: 59 | Source: Ambulatory Visit

## 2011-11-23 DIAGNOSIS — Z0000 Encounter for general adult medical examination without abnormal findings: Secondary | ICD-10-CM

## 2011-11-27 ENCOUNTER — Telehealth: Payer: Self-pay | Admitting: Family

## 2011-11-27 NOTE — Telephone Encounter (Signed)
Please call pt and let her know that I have reviewed stress test and it is negative.

## 2011-11-27 NOTE — Telephone Encounter (Signed)
Notified pt. 

## 2011-12-03 ENCOUNTER — Encounter: Payer: 59 | Admitting: Physician Assistant

## 2011-12-10 ENCOUNTER — Encounter: Payer: Self-pay | Admitting: Internal Medicine

## 2011-12-11 ENCOUNTER — Telehealth: Payer: Self-pay | Admitting: Family

## 2011-12-11 DIAGNOSIS — M8580 Other specified disorders of bone density and structure, unspecified site: Secondary | ICD-10-CM

## 2011-12-11 DIAGNOSIS — M810 Age-related osteoporosis without current pathological fracture: Secondary | ICD-10-CM | POA: Insufficient documentation

## 2011-12-11 NOTE — Telephone Encounter (Signed)
Pls call pt and let her know that her dexa shows osteopenia (mild bone thinning).  I would like her to add caltrate 600mg  + D bid to her other vitamin D supplement and make sure that she is getting regular weight bearing exercise such as walking.  Repeat dexa in 2 years.

## 2011-12-14 NOTE — Telephone Encounter (Signed)
Patient informed, understood & agreed/SLS  

## 2013-08-04 ENCOUNTER — Encounter: Payer: Self-pay | Admitting: Family

## 2013-08-04 ENCOUNTER — Ambulatory Visit (INDEPENDENT_AMBULATORY_CARE_PROVIDER_SITE_OTHER): Payer: 59 | Admitting: Family

## 2013-08-04 VITALS — BP 110/80 | HR 78 | Temp 97.8°F | Resp 16 | Ht 67.5 in | Wt 178.0 lb

## 2013-08-04 DIAGNOSIS — Z0000 Encounter for general adult medical examination without abnormal findings: Secondary | ICD-10-CM

## 2013-08-04 LAB — HEPATIC FUNCTION PANEL
ALT: 23 U/L (ref 0–35)
AST: 17 U/L (ref 0–37)
Albumin: 4.4 g/dL (ref 3.5–5.2)
Alkaline Phosphatase: 84 U/L (ref 39–117)
Bilirubin, Direct: 0.1 mg/dL (ref 0.0–0.3)
Indirect Bilirubin: 0.3 mg/dL (ref 0.2–1.2)
Total Bilirubin: 0.4 mg/dL (ref 0.2–1.2)
Total Protein: 6.6 g/dL (ref 6.0–8.3)

## 2013-08-04 LAB — CBC WITH DIFFERENTIAL/PLATELET
Basophils Absolute: 0.1 10*3/uL (ref 0.0–0.1)
Basophils Relative: 1 % (ref 0–1)
Eosinophils Absolute: 0.1 10*3/uL (ref 0.0–0.7)
Eosinophils Relative: 1 % (ref 0–5)
HCT: 42.5 % (ref 36.0–46.0)
Hemoglobin: 14.8 g/dL (ref 12.0–15.0)
Lymphocytes Relative: 31 % (ref 12–46)
Lymphs Abs: 2.2 10*3/uL (ref 0.7–4.0)
MCH: 31.5 pg (ref 26.0–34.0)
MCHC: 34.8 g/dL (ref 30.0–36.0)
MCV: 90.4 fL (ref 78.0–100.0)
Monocytes Absolute: 0.4 10*3/uL (ref 0.1–1.0)
Monocytes Relative: 6 % (ref 3–12)
Neutro Abs: 4.3 10*3/uL (ref 1.7–7.7)
Neutrophils Relative %: 61 % (ref 43–77)
Platelets: 258 10*3/uL (ref 150–400)
RBC: 4.7 MIL/uL (ref 3.87–5.11)
RDW: 13.9 % (ref 11.5–15.5)
WBC: 7.1 10*3/uL (ref 4.0–10.5)

## 2013-08-04 LAB — BASIC METABOLIC PANEL WITH GFR
BUN: 18 mg/dL (ref 6–23)
CO2: 25 mEq/L (ref 19–32)
Calcium: 9.6 mg/dL (ref 8.4–10.5)
Chloride: 106 mEq/L (ref 96–112)
Creat: 0.86 mg/dL (ref 0.50–1.10)
GFR, Est African American: 84 mL/min
GFR, Est Non African American: 73 mL/min
Glucose, Bld: 98 mg/dL (ref 70–99)
Potassium: 4.3 mEq/L (ref 3.5–5.3)
Sodium: 140 mEq/L (ref 135–145)

## 2013-08-04 LAB — LIPID PANEL
Cholesterol: 267 mg/dL — ABNORMAL HIGH (ref 0–200)
HDL: 45 mg/dL (ref >39)
LDL Cholesterol: 183 mg/dL — ABNORMAL HIGH (ref 0–99)
Total CHOL/HDL Ratio: 5.9 Ratio
Triglycerides: 197 mg/dL — ABNORMAL HIGH (ref <150)
VLDL: 39 mg/dL (ref 0–40)
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
## 2013-08-04 NOTE — Patient Instructions (Addendum)
Try to exercise 30 minutes 5 days a week.  Complete lab work prior to leaving. Follow up in 1 year- sooner if problems/concerns.

## 2013-08-04 NOTE — Progress Notes (Signed)
Pre visit review using our clinic review tool, if applicable. No additional management support is needed unless otherwise documented below in the visit note. 

## 2013-08-05 ENCOUNTER — Telehealth: Payer: Self-pay | Admitting: Family

## 2013-08-05 DIAGNOSIS — E785 Hyperlipidemia, unspecified: Secondary | ICD-10-CM | POA: Insufficient documentation

## 2013-08-05 LAB — URINALYSIS, ROUTINE W REFLEX MICROSCOPIC
Bilirubin Urine: NEGATIVE
Glucose, UA: NEGATIVE mg/dL
Hgb urine dipstick: NEGATIVE
Ketones, ur: NEGATIVE mg/dL
Leukocytes, UA: NEGATIVE
Nitrite: NEGATIVE
Protein, ur: NEGATIVE mg/dL
Specific Gravity, Urine: 1.023 (ref 1.005–1.030)
Urobilinogen, UA: 0.2 mg/dL (ref 0.0–1.0)
pH: 5.5 (ref 5.0–8.0)

## 2013-08-05 LAB — URINALYSIS, MICROSCOPIC ONLY
Bacteria, UA: NONE SEEN
Casts: NONE SEEN
Crystals: NONE SEEN
Squamous Epithelial / LPF: NONE SEEN

## 2013-08-05 LAB — TSH: TSH: 2.261 u[IU]/mL (ref 0.350–4.500)

## 2013-08-05 NOTE — Telephone Encounter (Signed)
Please let pt know that her cholesterol is elevated.  I would like her to work hard on low fat/low cholesterol diet, exercise and plan to repeat lipids in 6 months, dx hyperlipidemia.

## 2013-08-05 NOTE — Assessment & Plan Note (Signed)
Discussed importance of regular exercise.  Avoid tetanus vaccine due to previous reaction. Plan dexa next year.  Obtain fasting lab work.

## 2013-08-07 NOTE — Telephone Encounter (Signed)
Notified pt and she voices understanding. Lab order entered. 

## 2014-03-13 ENCOUNTER — Telehealth: Payer: Self-pay | Admitting: Family

## 2014-03-13 ENCOUNTER — Ambulatory Visit (INDEPENDENT_AMBULATORY_CARE_PROVIDER_SITE_OTHER): Payer: 59 | Admitting: Nurse Practitioner

## 2014-03-13 ENCOUNTER — Encounter: Payer: Self-pay | Admitting: Nurse Practitioner

## 2014-03-13 VITALS — BP 96/69 | HR 107 | Temp 100.0°F | Ht 67.5 in | Wt 175.0 lb

## 2014-03-13 DIAGNOSIS — J111 Influenza due to unidentified influenza virus with other respiratory manifestations: Secondary | ICD-10-CM

## 2014-03-13 MED ORDER — OSELTAMIVIR PHOSPHATE 75 MG PO CAPS: 75.0000 mg | ORAL_CAPSULE | Freq: Two times a day (BID) | ORAL | Status: DC

## 2014-03-13 MED ORDER — BENZONATATE 100 MG PO CAPS: ORAL_CAPSULE | ORAL | Status: DC

## 2014-03-13 NOTE — Progress Notes (Signed)
Pre visit review using our clinic review tool, if applicable. No additional management support is needed unless otherwise documented below in the visit note. 

## 2014-03-13 NOTE — Telephone Encounter (Signed)
Noted  

## 2014-03-13 NOTE — Progress Notes (Signed)
   Subjective:    Patient ID: Bonnie Brady, female    DOB: 1951-01-27, 63 y.o.   MRN: [id number]  Fever  This is a new problem. The current episode started in the past 7 days (3d). The problem occurs constantly. The problem has been waxing and waning. The maximum temperature noted was 101 to 101.9 F. The temperature was taken using an oral thermometer. Associated symptoms include congestion, coughing, ear pain, headaches and a sore throat. Pertinent negatives include no abdominal pain, chest pain, nausea, vomiting or wheezing. Treatments tried: asa. The treatment provided mild relief.      Review of Systems  Constitutional: Positive for fever.  HENT: Positive for congestion, ear pain, postnasal drip and sore throat.   Respiratory: Positive for cough. Negative for chest tightness and wheezing.   Cardiovascular: Negative for chest pain.  Gastrointestinal: Negative for nausea, vomiting and abdominal pain.  Neurological: Positive for headaches.       Objective:   Physical Exam  Constitutional: She is oriented to person, place, and time. She appears well-developed and well-nourished. No distress.  HENT:  Head: Normocephalic and atraumatic.  Right Ear: External ear normal.  Left Ear: External ear normal.  Mouth/Throat: No oropharyngeal exudate.  Mild erythema posterior pharynx  Eyes: Conjunctivae are normal. Right eye exhibits no discharge. Left eye exhibits no discharge.  Neck: Normal range of motion. Neck supple. No thyromegaly present.  Cardiovascular: Normal rate, regular rhythm and normal heart sounds.   No murmur heard. Pulmonary/Chest: Effort normal and breath sounds normal. No respiratory distress. She has no wheezes. She has no rales.  Lymphadenopathy:    She has no cervical adenopathy.  Neurological: She is alert and oriented to person, place, and time.  Skin: Skin is warm.  diaphoretic  Psychiatric: She has a normal mood and affect. Her behavior is normal. Thought  content normal.  Vitals reviewed.         Assessment & Plan:  1. Influenza - oseltamivir (TAMIFLU) 75 MG capsule; Take 1 capsule (75 mg total) by mouth 2 (two) times daily.  Dispense: 10 capsule; Refill: 0 - benzonatate (TESSALON) 100 MG capsule; Take 1-2 capsules po up to 3 times daily PRN cough  Dispense: 60 capsule; Refill: 0  See problem list for complete A&P See pt instructions. F/u  PRN

## 2014-03-13 NOTE — Patient Instructions (Signed)
You likely have flu. The average duration is 5-10 days. Treatment is largely symptom management. For sinus congestion, start daily sinus rinses (neilmed Sinus Rinse) & 30 mg to 60 mg pseudoephedrine twice daily. For sore throat use benzocaine throat lozenges or spray. For aches & fever alternate tylenol & ibuprophen every 4-6 hours. For cough, you may use a spoonful of honey thinned with lemon juice or hot tea, or benzonatate capsules as prescribed. Sip fluids every hour. Rest. If you are not feeling better in 10 days or develop fever or chest pain, call us for re-evaluation. Feel better!    Influenza A (H1N1) H1N1 formerly called "swine flu" is a new influenza virus causing sickness in people. The H1N1 virus is different from seasonal influenza viruses. However, the H1N1 symptoms are similar to seasonal influenza and it is spread from person to person. You may be at higher risk for serious problems if you have underlying serious medical conditions. The CDC and the [facility] are following reported cases around the world. CAUSES   The flu is thought to spread mainly person-to-person through coughing or sneezing of infected people.  A person may become infected by touching something with the virus on it and then touching their mouth or nose. SYMPTOMS   Fever.  Headache.  Tiredness.  Cough.  Sore throat.  Runny or stuffy nose.  Body aches.  Diarrhea and vomiting These symptoms are referred to as "flu-like symptoms." A lot of different illnesses, including the common cold, may have similar symptoms. DIAGNOSIS   There are tests that can tell if you have the H1N1 virus.  Confirmed cases of H1N1 will be reported to the state or local health department.  A doctor's exam may be needed to tell whether you have an infection that is a complication of the flu. HOME CARE INSTRUCTIONS   Stay informed. Visit the [facility] website for current recommendations. Visit  [employer]. You may also call 1-800-CDC-INFO 539-195-8633).  Get help early if you develop any of the above symptoms.  If you are at high risk from complications of the flu, talk to your caregiver as soon as you develop flu-like symptoms. Those at higher risk for complications include:  People 65 years or older.  People with chronic medical conditions.  Pregnant women.  Young children.  Your caregiver may recommend antiviral medicine to help treat the flu.  If you get the flu, get plenty of rest, drink enough water and fluids to keep your urine clear or pale yellow, and avoid using alcohol or tobacco.  You may take over-the-counter medicine to relieve the symptoms of the flu if your caregiver approves. (Never give aspirin to children or teenagers who have flu-like symptoms, particularly fever). TREATMENT  If you do get sick, antiviral drugs are available. These drugs can make your illness milder and make you feel better faster. Treatment should start soon after illness starts. It is only effective if taken within the first day of becoming ill. Only your caregiver can prescribe antiviral medication.  PREVENTION   Cover your nose and mouth with a tissue or your arm when you cough or sneeze. Throw the tissue away.  Wash your hands often with soap and warm water, especially after you cough or sneeze. Alcohol-based cleaners are also effective against germs.  Avoid touching your eyes, nose or mouth. This is one way germs spread.  Try to avoid contact with sick people. Follow public health advice regarding school closures. Avoid crowds.  Stay home if  you get sick. Limit contact with others to keep from infecting them. People infected with the H1N1 virus may be able to infect others anywhere from 1 day before feeling sick to 5-7 days after getting flu symptoms.  An H1N1 vaccine is available to help protect against the virus. In addition to the H1N1 vaccine, you will need to be  vaccinated for seasonal influenza. The H1N1 and seasonal vaccines may be given on the same day. The CDC especially recommends the H1N1 vaccine for:  Pregnant women.  People who live with or care for children younger than 41 months of age.  Health care and emergency services personnel.  Persons between the ages of 37 months through 42 years of age.  People from ages 79 through 40 years who are at higher risk for H1N1 because of chronic health disorders or immune system problems. FACEMASKS In community and home settings, the use of facemasks and N95 respirators are not normally recommended. In certain circumstances, a facemask or N95 respirator may be used for persons at increased risk of severe illness from influenza. Your caregiver can give additional recommendations for facemask use. IN CHILDREN, EMERGENCY WARNING SIGNS THAT NEED URGENT MEDICAL CARE:  Fast breathing or trouble breathing.  Bluish skin color.  Not drinking enough fluids.  Not waking up or not interacting normally.  Being so fussy that the child does not want to be held.  Your child has an oral temperature above 102 F (38.9 C), not controlled by medicine.  Your baby is older than 3 months with a rectal temperature of 102 F (38.9 C) or higher.  Your baby is 83 months old or younger with a rectal temperature of 100.4 F (38 C) or higher.  Flu-like symptoms improve but then return with fever and worse cough. IN ADULTS, EMERGENCY WARNING SIGNS THAT NEED URGENT MEDICAL CARE:  Difficulty breathing or shortness of breath.  Pain or pressure in the chest or abdomen.  Sudden dizziness.  Confusion.  Severe or persistent vomiting.  Bluish color.  You have a oral temperature above 102 F (38.9 C), not controlled by medicine.  Flu-like symptoms improve but return with fever and worse cough. SEEK IMMEDIATE MEDICAL CARE IF:  You or someone you know is experiencing any of the above symptoms. When you arrive at the  emergency center, report that you think you have the flu. You may be asked to wear a mask and/or sit in a secluded area to protect others from getting sick. MAKE SURE YOU:   Understand these instructions.  Will watch your condition.  Will get help right away if you are not doing well or get worse. Some of this information courtesy of the CDC.  Document Released: 09/02/2007 Document Revised: 06/08/2011 Document Reviewed: 09/02/2007 [facility] Patient Information 2014 [place], [place].

## 2014-03-13 NOTE — Telephone Encounter (Signed)
Pt spouse ARUSHI, PARTRIDGE stated wife is expercicing chills and has a fever scheduled pt with Dr. Hinda Glatter in [place]; transferred to [facility]

## 2014-07-29 LAB — HM MAMMOGRAPHY: HM Mammogram: NORMAL

## 2014-09-05 ENCOUNTER — Telehealth: Payer: Self-pay | Admitting: Family

## 2014-09-05 NOTE — Telephone Encounter (Signed)
Pre Visit letter sent  °

## 2014-09-24 ENCOUNTER — Telehealth: Payer: Self-pay | Admitting: *Deleted

## 2014-09-24 ENCOUNTER — Encounter: Payer: Self-pay | Admitting: *Deleted

## 2014-09-24 DIAGNOSIS — Z0000 Encounter for general adult medical examination without abnormal findings: Secondary | ICD-10-CM

## 2014-09-24 NOTE — Telephone Encounter (Signed)
Pre-Visit Call completed with patient and chart updated.   Pre-Visit Info documented in Specialty Comments under [employer].    

## 2014-09-24 NOTE — Telephone Encounter (Signed)
Patient would like to come early for her CPE labs- lab appointment scheduled for 8am.    Please order desired labs.

## 2014-09-25 ENCOUNTER — Other Ambulatory Visit (INDEPENDENT_AMBULATORY_CARE_PROVIDER_SITE_OTHER): Payer: 59

## 2014-09-25 ENCOUNTER — Encounter: Payer: Self-pay | Admitting: Family

## 2014-09-25 ENCOUNTER — Ambulatory Visit (INDEPENDENT_AMBULATORY_CARE_PROVIDER_SITE_OTHER): Payer: 59 | Admitting: Family

## 2014-09-25 VITALS — BP 118/82 | HR 86 | Temp 97.7°F | Ht 67.0 in | Wt 176.5 lb

## 2014-09-25 DIAGNOSIS — Z0000 Encounter for general adult medical examination without abnormal findings: Secondary | ICD-10-CM

## 2014-09-25 DIAGNOSIS — R7989 Other specified abnormal findings of blood chemistry: Secondary | ICD-10-CM | POA: Diagnosis not present

## 2014-09-25 DIAGNOSIS — M8580 Other specified disorders of bone density and structure, unspecified site: Secondary | ICD-10-CM | POA: Diagnosis not present

## 2014-09-25 DIAGNOSIS — B351 Tinea unguium: Secondary | ICD-10-CM | POA: Diagnosis not present

## 2014-09-25 LAB — LIPID PANEL
Cholesterol: 261 mg/dL — ABNORMAL HIGH (ref 0–200)
HDL: 42.6 mg/dL (ref >39.00)
NonHDL: 218.4
Total CHOL/HDL Ratio: 6
Triglycerides: 278 mg/dL — ABNORMAL HIGH (ref 0.0–149.0)
VLDL: 55.6 mg/dL — ABNORMAL HIGH (ref 0.0–40.0)

## 2014-09-25 LAB — HEPATIC FUNCTION PANEL
ALT: 18 U/L (ref 0–35)
AST: 16 U/L (ref 0–37)
Albumin: 4.3 g/dL (ref 3.5–5.2)
Alkaline Phosphatase: 96 U/L (ref 39–117)
Bilirubin, Direct: 0 mg/dL (ref 0.0–0.3)
Total Bilirubin: 0.4 mg/dL (ref 0.2–1.2)
Total Protein: 7.2 g/dL (ref 6.0–8.3)

## 2014-09-25 LAB — BASIC METABOLIC PANEL
BUN: 18 mg/dL (ref 6–23)
CO2: 27 mEq/L (ref 19–32)
Calcium: 9.5 mg/dL (ref 8.4–10.5)
Chloride: 106 mEq/L (ref 96–112)
Creatinine, Ser: 0.82 mg/dL (ref 0.40–1.20)
GFR: 74.73 mL/min (ref >60.00)
Glucose, Bld: 93 mg/dL (ref 70–99)
Potassium: 4.1 mEq/L (ref 3.5–5.1)
Sodium: 141 mEq/L (ref 135–145)

## 2014-09-25 LAB — CBC WITH DIFFERENTIAL/PLATELET
Basophils Absolute: 0 10*3/uL (ref 0.0–0.1)
Basophils Relative: 0.6 % (ref 0.0–3.0)
Eosinophils Absolute: 0.1 10*3/uL (ref 0.0–0.7)
Eosinophils Relative: 0.9 % (ref 0.0–5.0)
HCT: 44.5 % (ref 36.0–46.0)
Hemoglobin: 14.8 g/dL (ref 12.0–15.0)
Lymphocytes Relative: 21.7 % (ref 12.0–46.0)
Lymphs Abs: 1.8 10*3/uL (ref 0.7–4.0)
MCHC: 33.2 g/dL (ref 30.0–36.0)
MCV: 91.8 fl (ref 78.0–100.0)
Monocytes Absolute: 0.5 10*3/uL (ref 0.1–1.0)
Monocytes Relative: 6.3 % (ref 3.0–12.0)
Neutro Abs: 6 10*3/uL (ref 1.4–7.7)
Neutrophils Relative %: 70.5 % (ref 43.0–77.0)
Platelets: 250 10*3/uL (ref 150.0–400.0)
RBC: 4.84 Mil/uL (ref 3.87–5.11)
RDW: 13.5 % (ref 11.5–15.5)
WBC: 8.5 10*3/uL (ref 4.0–10.5)

## 2014-09-25 LAB — URINALYSIS, ROUTINE W REFLEX MICROSCOPIC
Bilirubin Urine: NEGATIVE
Hgb urine dipstick: NEGATIVE
Ketones, ur: NEGATIVE
Leukocytes, UA: NEGATIVE
Nitrite: NEGATIVE
RBC / HPF: NONE SEEN (ref 0–?)
Specific Gravity, Urine: <=1.005 — AB (ref 1.000–1.030)
Total Protein, Urine: NEGATIVE
Urine Glucose: NEGATIVE
Urobilinogen, UA: 0.2 (ref 0.0–1.0)
WBC, UA: NONE SEEN (ref 0–?)
pH: 6 (ref 5.0–8.0)

## 2014-09-25 LAB — TSH: TSH: 2.1 u[IU]/mL (ref 0.35–4.50)

## 2014-09-25 LAB — LDL CHOLESTEROL, DIRECT: Direct LDL: 150 mg/dL

## 2014-09-25 MED ORDER — EFINACONAZOLE 10 % EX SOLN: 2.0000 [drp] | Freq: Every day | CUTANEOUS | Status: DC

## 2014-09-25 NOTE — Progress Notes (Addendum)
Subjective:    Patient ID: Bonnie Brady, female    DOB: 04/24/1950, 64 y.o.   MRN: [id number]  HPI  Bonnie Brady is a 64 yr old female who presents today for a complete physical.   Immunizations: up to date Diet:  healthy Exercise:  Going to the gym, elliptical. Colonoscopy: has apt with GI to schedule Dexa: osteopenia 2013- due Pap Smear: hysterectomy- will see GYN in the end of July.  Mammogram: up to date Could not tolerate calcium supplement due to constipation.   Vision- due- will schedule Dental- up to date  Toenail fungus- reports that she developed toenail fungus left great toenail over the winter.  Nail is now not growing.    Review of Systems  Constitutional: Negative for unexpected weight change.  HENT: Negative for hearing loss and rhinorrhea.   Eyes: Negative for visual disturbance.  Respiratory: Negative for cough and shortness of breath.   Cardiovascular: Negative for chest pain and leg swelling.  Gastrointestinal: Negative for nausea, diarrhea, constipation and blood in stool.  Genitourinary: Negative for dysuria and frequency.  Musculoskeletal: Negative for myalgias and arthralgias.  Skin: Negative for rash.  Neurological: Negative for headaches.  Hematological: Negative for adenopathy.  Psychiatric/Behavioral: Negative for dysphoric mood and agitation.   Past Medical History  Diagnosis Date  . Kidney stone   . Varicose veins   . Hyperlipidemia     History   Social History  . Marital Status: Married    Spouse Name: N/A  . Number of Children: N/A  . Years of Education: N/A   Occupational History  . Not on file.   Social History Main Topics  . Smoking status: Former Smoker    Types: Cigarettes    Quit date: 03/30/2008  . Smokeless tobacco: Not on file  . Alcohol Use: Not on file  . Drug Use: Not on file  . Sexual Activity: Not on file   Other Topics Concern  . Not on file   Social History Narrative   Has worked as Regulatory affairs officer- stopped working 2011.    Married   2 children   Enjoys Airline pilot          Past Surgical History  Procedure Laterality Date  . Bladder strap  2006  . Stripping varicose veins  2010  . Tonsillectomy  1958  . Appendectomy  1971  . Cesarean section  1978 and 1979    Family History  Problem Relation Age of Onset  . Breast cancer Paternal Grandmother   . Stroke Mother   . Stroke Maternal Grandfather     Allergies  Allergen Reactions  . Ampicillin Hives    Questionable.  . Latex Other (See Comments)    Contact with powder.  . Tetanus Toxoids Other (See Comments)    Swelling / redness    Current Outpatient Prescriptions on File Prior to Visit  Medication Sig Dispense Refill  . Cholecalciferol (VITAMIN D-3) 1000 UNITS CAPS Take 2 capsules by mouth daily.     . NON FORMULARY Take by mouth daily. Juice Plus Supplement [employer] Foods [total 6 daily]    . PRESCRIPTION MEDICATION Apply topically daily. BI-EST (6:4) w/progest 0:15 + 8% Cream     No current facility-administered medications on file prior to visit.    BP 118/82 mmHg  Pulse 86  Temp(Src) 97.7 F (36.5 C) (Oral)  Ht 5\' 7"  (1.702 m)  Wt 176 lb 8 oz (80.06 kg)  BMI 27.64  kg/m2  SpO2 92%       Objective:   Physical Exam Physical Exam  Constitutional: She is oriented to person, place, and time. She appears well-developed and well-nourished. No distress.  HENT:  Head: Normocephalic and atraumatic.  Right Ear: Tympanic membrane and ear canal normal.  Left Ear: Tympanic membrane and ear canal normal.  Mouth/Throat: Oropharynx is clear and moist.  Eyes: Pupils are equal, round, and reactive to light. No scleral icterus.  Neck: Normal range of motion. No thyromegaly present.  Cardiovascular: Normal rate and regular rhythm.   No murmur heard. Pulmonary/Chest: Effort normal and breath sounds normal. No respiratory distress. He has no wheezes. She has no rales. She exhibits no  tenderness.  Abdominal: Soft. Bowel sounds are normal. He exhibits no distension and no mass. There is no tenderness. There is no rebound and no guarding.  Musculoskeletal: She exhibits no edema.  Lymphadenopathy:    She has no cervical adenopathy.  Neurological: She is alert and oriented to person, place, and time. She has normal reflexes. She exhibits normal muscle tone. Coordination normal.  Skin: Skin is warm and dry. left great toenail is thickened  Psychiatric: She has a normal mood and affect. Her behavior is normal. Judgment and thought content normal.  Breast/pelvic: deferred         Assessment & Plan:   EKG is performed/personally reviewed and compared to 08/04/13 EKG. No significant change is noted.        Assessment & Plan:  EKG is personally reviewed and compared to EKG from last year- NSR appears unchanged.

## 2014-09-25 NOTE — Patient Instructions (Signed)
Start Jublia topical med for toenail. This is applied to clean dry, unpolished toenail once daily for 48 weeks.  I would like to see you back in 6 months please.

## 2014-09-25 NOTE — Assessment & Plan Note (Signed)
We discussed oral lamisil, but she declines due to need for blood monitoring. Would like to try topical rx.  rx sent to her pharmacy.

## 2014-09-25 NOTE — Progress Notes (Signed)
Pre visit review using our clinic review tool, if applicable. No additional management support is needed unless otherwise documented below in the visit note. 

## 2014-09-25 NOTE — Assessment & Plan Note (Signed)
Immunizations reviewed and up to date.  Continue healthy diet, exercise.  Obtain routine labs. Refer for dexa, discussed importance of adequate calcium intake.

## 2014-09-27 ENCOUNTER — Telehealth: Payer: Self-pay | Admitting: Family

## 2014-09-27 NOTE — Addendum Note (Signed)
Addended by: [name] on: 09/27/2014 07:14 AM   Modules accepted: Miquel Dunn

## 2014-09-27 NOTE — Telephone Encounter (Signed)
Please advise 

## 2014-09-27 NOTE — Telephone Encounter (Signed)
If she wants to try oral lamisil ok to send 250mg  once daily #45, then complete LFT in 6 weeks dx (therapeutic drug monitoring) before completing an additional 6 weeks.

## 2014-09-27 NOTE — Telephone Encounter (Signed)
Caller name: leane Relation to pt: self Call back number:  [phone] Pharmacy: [facility] in [place]  Reason for call:   Patient states that her insurance will not cover  Efinaconazole and is wanting to know if Lamisil or another rx could be prescribed.

## 2014-09-28 ENCOUNTER — Encounter: Payer: Self-pay | Admitting: Family

## 2014-10-02 NOTE — Telephone Encounter (Signed)
Notified pt. She states she does not want to take medication by mouth and was hoping lamisil would be available in the topical form. States she has already checked with pharmacy and is aware that there is not another Rx strength topical treatment. Pt will let us know if she changes her mind in the future. States previous Rx would cost her $600 and she is not going to pursue treatment at this time.

## 2014-10-03 NOTE — Addendum Note (Signed)
Addended by: [name] on: 10/03/2014 07:17 AM   Modules accepted: Orders

## 2014-10-03 NOTE — Telephone Encounter (Signed)
Noted  

## 2014-10-10 LAB — HM COLONOSCOPY: HM Colonoscopy: NORMAL

## 2014-11-01 ENCOUNTER — Encounter: Payer: Self-pay | Admitting: Family

## 2015-07-29 LAB — HM MAMMOGRAPHY: HM Mammogram: NORMAL (ref 0–4)

## 2015-09-24 ENCOUNTER — Ambulatory Visit (INDEPENDENT_AMBULATORY_CARE_PROVIDER_SITE_OTHER): Payer: BLUE CROSS/BLUE SHIELD | Admitting: Family

## 2015-09-24 ENCOUNTER — Encounter: Payer: Self-pay | Admitting: Family

## 2015-09-24 VITALS — BP 110/81 | HR 71 | Temp 97.9°F | Resp 16 | Ht 67.0 in | Wt 174.4 lb

## 2015-09-24 DIAGNOSIS — Z0000 Encounter for general adult medical examination without abnormal findings: Secondary | ICD-10-CM | POA: Diagnosis not present

## 2015-09-24 LAB — CBC WITH DIFFERENTIAL/PLATELET
Basophils Absolute: 0 10*3/uL (ref 0.0–0.1)
Basophils Relative: 0.6 % (ref 0.0–3.0)
Eosinophils Absolute: 0.1 10*3/uL (ref 0.0–0.7)
Eosinophils Relative: 1 % (ref 0.0–5.0)
HCT: 44 % (ref 36.0–46.0)
Hemoglobin: 14.5 g/dL (ref 12.0–15.0)
Lymphocytes Relative: 29 % (ref 12.0–46.0)
Lymphs Abs: 2.2 10*3/uL (ref 0.7–4.0)
MCHC: 33 g/dL (ref 30.0–36.0)
MCV: 91.8 fl (ref 78.0–100.0)
Monocytes Absolute: 0.5 10*3/uL (ref 0.1–1.0)
Monocytes Relative: 7 % (ref 3.0–12.0)
Neutro Abs: 4.7 10*3/uL (ref 1.4–7.7)
Neutrophils Relative %: 62.4 % (ref 43.0–77.0)
Platelets: 215 10*3/uL (ref 150.0–400.0)
RBC: 4.79 Mil/uL (ref 3.87–5.11)
RDW: 14.1 % (ref 11.5–15.5)
WBC: 7.6 10*3/uL (ref 4.0–10.5)

## 2015-09-24 LAB — URINALYSIS, ROUTINE W REFLEX MICROSCOPIC
Bilirubin Urine: NEGATIVE
Ketones, ur: NEGATIVE
Leukocytes, UA: NEGATIVE
Nitrite: NEGATIVE
Specific Gravity, Urine: 1.02 (ref 1.000–1.030)
Total Protein, Urine: NEGATIVE
Urine Glucose: NEGATIVE
Urobilinogen, UA: 0.2 (ref 0.0–1.0)
pH: 5.5 (ref 5.0–8.0)

## 2015-09-24 LAB — BASIC METABOLIC PANEL
BUN: 20 mg/dL (ref 6–23)
CO2: 28 mEq/L (ref 19–32)
Calcium: 9.4 mg/dL (ref 8.4–10.5)
Chloride: 106 mEq/L (ref 96–112)
Creatinine, Ser: 0.84 mg/dL (ref 0.40–1.20)
GFR: 72.45 mL/min (ref >60.00)
Glucose, Bld: 90 mg/dL (ref 70–99)
Potassium: 4 mEq/L (ref 3.5–5.1)
Sodium: 139 mEq/L (ref 135–145)

## 2015-09-24 LAB — HEPATIC FUNCTION PANEL
ALT: 15 U/L (ref 0–35)
AST: 15 U/L (ref 0–37)
Albumin: 4.3 g/dL (ref 3.5–5.2)
Alkaline Phosphatase: 79 U/L (ref 39–117)
Bilirubin, Direct: 0 mg/dL (ref 0.0–0.3)
Total Bilirubin: 0.4 mg/dL (ref 0.2–1.2)
Total Protein: 7.2 g/dL (ref 6.0–8.3)

## 2015-09-24 LAB — LIPID PANEL
Cholesterol: 258 mg/dL — ABNORMAL HIGH (ref 0–200)
HDL: 42.7 mg/dL (ref >39.00)
NonHDL: 215.1
Total CHOL/HDL Ratio: 6
Triglycerides: 275 mg/dL — ABNORMAL HIGH (ref 0.0–149.0)
VLDL: 55 mg/dL — ABNORMAL HIGH (ref 0.0–40.0)

## 2015-09-24 LAB — LDL CHOLESTEROL, DIRECT: Direct LDL: 165 mg/dL

## 2015-09-24 LAB — VITAMIN D 25 HYDROXY (VIT D DEFICIENCY, FRACTURES): VITD: 45.45 ng/mL (ref 30.00–100.00)

## 2015-09-24 LAB — TSH: TSH: 1.8 u[IU]/mL (ref 0.35–4.50)

## 2015-09-24 NOTE — Progress Notes (Signed)
Subjective:    Patient ID: Bonnie Brady, female    DOB: 02-02-1951, 65 y.o.   MRN: [id number]  HPI  Bonnie Brady is a 65 yr old female who presents today for cpx.  Immunizations:  Declines tetanus due to previous hx of swelling at the site in the past Diet: eating low carb Exercise: 4 x a week at the gym Wt Readings from Last 3 Encounters:  09/24/15 174 lb 6.4 oz (79.107 kg)  09/25/14 176 lb 8 oz (80.06 kg)  03/13/14 175 lb (79.379 kg)  Colonoscopy:7/16 Dexa:  9/16 Pap Smear:  hysterectomy Mammogram: 5/17 Dental: up to date Vision: up to date   Review of Systems  Constitutional: Negative for unexpected weight change.  HENT: Negative for hearing loss and rhinorrhea.   Eyes: Negative for visual disturbance.  Respiratory: Negative for cough.   Cardiovascular: Negative for leg swelling.  Gastrointestinal: Negative for diarrhea and constipation.  Genitourinary: Negative for dysuria and frequency.  Musculoskeletal: Negative for myalgias.       Basal joint arthritis in thumbs  Skin: Negative for rash.  Neurological: Negative for headaches.  Hematological: Negative for adenopathy.  Psychiatric/Behavioral:       Denies depression/anxiety       Past Medical History  Diagnosis Date  . Kidney stone   . Varicose veins   . Hyperlipidemia      Social History   Social History  . Marital Status: Married    Spouse Name: N/A  . Number of Children: N/A  . Years of Education: N/A   Occupational History  . Not on file.   Social History Main Topics  . Smoking status: Former Smoker    Types: Cigarettes    Quit date: 03/30/2008  . Smokeless tobacco: Not on file  . Alcohol Use: Yes     Comment: occasional < 1 a week  . Drug Use: Not on file  . Sexual Activity: Not on file   Other Topics Concern  . Not on file   Social History Narrative   Has worked as Engineering geologist- stopped working 2011.    Married   2 children   Enjoys Airline pilot          Past Surgical History  Procedure Laterality Date  . Bladder strap  2006  . Laser of veins  2010    also in 2016 and 2017  . Tonsillectomy  1958  . Appendectomy  1971  . Cesarean section  1978 and 1979    Family History  Problem Relation Age of Onset  . Breast cancer Paternal Grandmother   . Stroke Mother   . Stroke Maternal Grandfather     Allergies  Allergen Reactions  . Ampicillin Hives    Questionable.  . Latex Other (See Comments)    Contact with powder.  . Tetanus Toxoids Other (See Comments)    Swelling / redness    Current Outpatient Prescriptions on File Prior to Visit  Medication Sig Dispense Refill  . Cholecalciferol (VITAMIN D-3) 1000 UNITS CAPS Take 2 capsules by mouth daily.     . NON FORMULARY Take by mouth daily. Juice Plus Supplement [employer] Foods [total 6 daily]    . PRESCRIPTION MEDICATION Apply topically daily. BI-EST (6:4) w/progest 0:15 + 8% Cream     No current facility-administered medications on file prior to visit.    BP 110/81 mmHg  Pulse 71  Temp(Src) 97.9 F (36.6 C) (Oral)  Resp 16  Ht  5\' 7"  (1.702 m)  Wt 174 lb 6.4 oz (79.107 kg)  BMI 27.31 kg/m2  SpO2 96%    Objective:   Physical Exam  Physical Exam  Constitutional: She is oriented to person, place, and time. She appears well-developed and well-nourished. No distress.  HENT:  Head: Normocephalic and atraumatic.  Right Ear: Tympanic membrane and ear canal normal.  Left Ear: Tympanic membrane and ear canal normal.  Mouth/Throat: Oropharynx is clear and moist.  Eyes: Pupils are equal, round, and reactive to light. No scleral icterus.  Neck: Normal range of motion. No thyromegaly present.  Cardiovascular: Normal rate and regular rhythm.   No murmur heard. Pulmonary/Chest: Effort normal and breath sounds normal. No respiratory distress. He has no wheezes. She has no rales. She exhibits no tenderness.  Abdominal: Soft. Bowel sounds are normal. He  exhibits no distension and no mass. There is no tenderness. There is no rebound and no guarding.  Musculoskeletal: She exhibits no edema.  Lymphadenopathy:    She has no cervical adenopathy.  Neurological: She is alert and oriented to person, place, and time. She has normal reflexes. She exhibits normal muscle tone. Coordination normal.  Skin: Skin is warm and dry.  Psychiatric: She has a normal mood and affect. Her behavior is normal. Judgment and thought content normal.  Breasts: Examined lying Right: Without masses, retractions, discharge or axillary adenopathy.  Left: Without masses, retractions, discharge or axillary adenopathy.  Pelvic: deferred          Assessment & Plan:         Assessment & Plan:

## 2015-09-24 NOTE — Progress Notes (Signed)
Pre visit review using our clinic review tool, if applicable. No additional management support is needed unless otherwise documented below in the visit note. 

## 2015-09-24 NOTE — Patient Instructions (Signed)
Continue healthy diet and exercise. Complete lab work prior to leaving.

## 2015-09-24 NOTE — Assessment & Plan Note (Addendum)
Discussed healthy diet, exercise and weight loss. Obtain routine lab work. EKG is personally reviewed and appears unchanged compared to prior EKGs.  Had neg stress test 2013.

## 2015-09-25 ENCOUNTER — Encounter: Payer: Self-pay | Admitting: Family

## 2015-11-13 ENCOUNTER — Telehealth: Payer: Self-pay | Admitting: Family

## 2015-11-13 DIAGNOSIS — E785 Hyperlipidemia, unspecified: Secondary | ICD-10-CM

## 2015-11-13 MED ORDER — ROSUVASTATIN CALCIUM 10 MG PO TABS: 10.0000 mg | ORAL_TABLET | Freq: Every day | ORAL | 3 refills | Status: DC

## 2015-11-13 NOTE — Telephone Encounter (Signed)
Notified pt and scheduled lab appt for 12/25/15 at [facility], future order entered.

## 2015-11-13 NOTE — Telephone Encounter (Signed)
Spoke with pt, she states that she has worked on diet/exercise in the past to lower her cholesterol and doesn't feel that it has helped. Pt is asking to try Crestor.  Please advise?

## 2015-11-13 NOTE — Telephone Encounter (Signed)
rx sent for crestor.  Repeat lipids in 6 weeks please dx hyperlipidemia.

## 2015-11-13 NOTE — Telephone Encounter (Signed)
Relation to [id number] Call back number:[phone]   Reason for call:  Patient inquiring about lab results

## 2015-11-29 LAB — HM PAP SMEAR: HM Pap smear: NORMAL

## 2015-12-25 ENCOUNTER — Encounter: Payer: Self-pay | Admitting: Family

## 2015-12-25 ENCOUNTER — Other Ambulatory Visit (INDEPENDENT_AMBULATORY_CARE_PROVIDER_SITE_OTHER): Payer: BLUE CROSS/BLUE SHIELD

## 2015-12-25 DIAGNOSIS — E785 Hyperlipidemia, unspecified: Secondary | ICD-10-CM

## 2015-12-25 LAB — LIPID PANEL
Cholesterol: 135 mg/dL (ref 0–200)
HDL: 45.3 mg/dL (ref >39.00)
LDL Cholesterol: 68 mg/dL (ref 0–99)
NonHDL: 89.78
Total CHOL/HDL Ratio: 3
Triglycerides: 108 mg/dL (ref 0.0–149.0)
VLDL: 21.6 mg/dL (ref 0.0–40.0)

## 2016-01-20 ENCOUNTER — Telehealth: Payer: Self-pay | Admitting: Cardiovascular Disease

## 2016-01-20 NOTE — Telephone Encounter (Signed)
Mr. Bonnie Brady is calling because about 3-4 nights ago  Mrs. Bonnie Brady heart was beating fast  and today after exercise  it is beating fast ( racing)  Felt like she was going to pass out . Please call

## 2016-01-20 NOTE — Telephone Encounter (Signed)
Called Mrs. Frane. She has not been seen by [facility] and would be a new patient. Husband is a longstanding Dr. Gwenlyn Found patient, she requested new OV eval by Dr. Gwenlyn Found if possible - declined PA visit. States she's experienced intermittent palps for 3-4 days w no other concurrent symptoms. She did have an episode of lightheadedness 2 days ago on treadmill at gym as well. Billie helped me w appt arrangement, and I confirmed w patient new OV for tomorrow at 10:45a.

## 2016-01-21 ENCOUNTER — Ambulatory Visit (INDEPENDENT_AMBULATORY_CARE_PROVIDER_SITE_OTHER): Payer: BLUE CROSS/BLUE SHIELD | Admitting: Cardiovascular Disease

## 2016-01-21 ENCOUNTER — Encounter: Payer: Self-pay | Admitting: Cardiovascular Disease

## 2016-01-21 VITALS — BP 140/96 | HR 80 | Ht 66.0 in | Wt 169.0 lb

## 2016-01-21 DIAGNOSIS — R002 Palpitations: Secondary | ICD-10-CM | POA: Diagnosis not present

## 2016-01-21 LAB — TSH: TSH: 1.83 mIU/L

## 2016-01-21 LAB — T4, FREE: Free T4: 1.1 ng/dL (ref 0.8–1.8)

## 2016-01-21 MED ORDER — METOPROLOL SUCCINATE ER 25 MG PO TB24: 25.0000 mg | ORAL_TABLET | Freq: Every day | ORAL | 3 refills | Status: DC

## 2016-01-21 NOTE — Patient Instructions (Signed)
Medication Instructions:  START Toprol XL 25 mg daily.  Labwork: Your physician recommends that you return for lab work today: TSH, Free T4   Testing/Procedures: Your physician has recommended that you wear an event monitor. Event monitors are medical devices that record the heart's electrical activity. Doctors most often [place] these monitors to diagnose arrhythmias. Arrhythmias are problems with the speed or rhythm of the heartbeat. The monitor is a small, portable device. You can wear one while you do your normal daily activities. This is usually used to diagnose what is causing palpitations/syncope (passing out). 30 day event at [facility] has requested that you have an echocardiogram. Echocardiography is a painless test that uses sound waves to create images of your heart. It provides your doctor with information about the size and shape of your heart and how well your heart's chambers and valves are working. This procedure takes approximately one hour. There are no restrictions for this procedure.  Your physician has requested that you have en exercise stress myoview. For further information please visit [email or web address]. Please follow instruction sheet, as given.    Follow-Up: Your physician recommends that you schedule a follow-up appointment after testing, 6 weeks after monitor has been placed at [facility]. with Dr. Gwenlyn Found.   Any Other Special Instructions Will Be Listed Below (If Applicable).  Exercise Stress Electrocardiogram An exercise stress electrocardiogram is a test to check how blood flows to your heart. It is done to find areas of poor blood flow. You will need to walk on a treadmill for this test. The electrocardiogram will record your heartbeat when you are at rest and when you are exercising. BEFORE THE PROCEDURE  Do not have drinks with caffeine or foods with caffeine for 24 hours before the test, or as told by your doctor. This includes coffee, tea  (even decaf tea), sodas, chocolate, and cocoa.  Follow your doctor's instructions about eating and drinking before the test.  Ask your doctor what medicines you should or should not take before the test. Take your medicines with water unless told by your doctor not to.  If you use an inhaler, bring it with you to the test.  Bring a snack to eat after the test.  Do not  smoke for 4 hours before the test.  Do not put lotions, powders, creams, or oils on your chest before the test.  Wear comfortable shoes and clothing. PROCEDURE  You will have patches put on your chest. Small areas of your chest may need to be shaved. Wires will be connected to the patches.  Your heart rate will be watched while you are resting and while you are exercising.  You will walk on the treadmill. The treadmill will slowly get faster to raise your heart rate.  The test will take about 1-2 hours. AFTER THE PROCEDURE  Your heart rate and blood pressure will be watched after the test.  You may return to your normal diet, activities, and medicines or as told by your doctor.   This information is not intended to replace advice given to you by your health care provider. Make sure you discuss any questions you have with your health care provider.   Document Released: 09/02/2007 Document Revised: 04/06/2014 Document Reviewed: 11/21/2012 Elsevier Interactive Patient Education [employer].     If you need a refill on your cardiac medications before your next appointment, please call your pharmacy.

## 2016-01-21 NOTE — Progress Notes (Signed)
01/21/2016 Bonnie Brady   25-Aug-1950  [id number]  Primary Physician Nance Pear., NP Primary Cardiologist: Lorretta Harp MD Renae Gloss  HPI:  Bonnie Brady is a 65 year old mild to moderately overweight married Caucasian female mother of 2, grandmother and 6 grandchildren is accompanied by her husband Dorian Pod whose also patient lying. She was referred by her PCP, [name], for cardiac evaluation because of new onset tachycardia palpitations and presyncope. Her only risk factor is treated hyperlipidemia. She's never had a heart attack or stroke. She denies chest pain or shortness of breath. She does drink several cups of coffee a day and does have a remote history of tobacco abuse having smoked 40 pack years and stopped 7 years ago.   Current Outpatient Prescriptions  Medication Sig Dispense Refill  . Cholecalciferol (VITAMIN D-3) 1000 UNITS CAPS Take 2 capsules by mouth daily.     . NON FORMULARY Take by mouth daily. Juice Plus Supplement Whole Foods [total 6 daily]    . PRESCRIPTION MEDICATION Apply topically daily. BI-EST (6:4) w/progest 0:15 + 8% Cream    . rosuvastatin (CRESTOR) 10 MG tablet Take 1 tablet (10 mg total) by mouth daily. 30 tablet 3   No current facility-administered medications for this visit.     Allergies  Allergen Reactions  . Ampicillin Hives    Questionable.  . Latex Other (See Comments)    Contact with powder.  . Tetanus Toxoids Other (See Comments)    Swelling / redness    Social History   Social History  . Marital status: Married    Spouse name: N/A  . Number of children: N/A  . Years of education: N/A   Occupational History  . Not on file.   Social History Main Topics  . Smoking status: Former Smoker    Types: Cigarettes    Quit date: 03/30/2008  . Smokeless tobacco: Never Used  . Alcohol use Yes     Comment: occasional < 1 a week  . Drug use: Unknown  . Sexual activity: Not on file   Other Topics  Concern  . Not on file   Social History Narrative   Has worked as Engineering geologist- stopped working 2011.    Married   2 children   Enjoys Airline pilot           Review of Systems: General: negative for chills, fever, night sweats or weight changes.  Cardiovascular: negative for chest pain, dyspnea on exertion, edema, orthopnea, palpitations, paroxysmal nocturnal dyspnea or shortness of breath Dermatological: negative for rash Respiratory: negative for cough or wheezing Urologic: negative for hematuria Abdominal: negative for nausea, vomiting, diarrhea, bright red blood per rectum, melena, or hematemesis Neurologic: negative for visual changes, syncope, or dizziness All other systems reviewed and are otherwise negative except as noted above.    Blood pressure (!) 140/96, pulse 80, height 5\' 6"  (1.676 m), weight 169 lb (76.7 kg).  General appearance: alert and no distress Neck: no adenopathy, no carotid bruit, no JVD, supple, symmetrical, trachea midline and thyroid not enlarged, symmetric, no tenderness/mass/nodules Lungs: clear to auscultation bilaterally Heart: regular rate and rhythm, S1, S2 normal, no murmur, click, rub or gallop Extremities: extremities normal, atraumatic, no cyanosis or edema  EKG sinus rhythm at 80 without ST or T-wave changes. I personally reviewed this EKG  ASSESSMENT AND PLAN:   Hyperlipidemia History of hyperlipidemia on statin therapy with recent lipid profile performed 12/25/15 revealed total cholesterol  135, LDL 68 and HDL 45.  Palpitations Bonnie Brady is a 65 year old mild to moderately overweight married Caucasian female who presents today for evaluation of recent onset tachycardia palpitations with presyncope. She's had 3 episodes over the last week 2 of which wakened from sleep and 1 which occurred several days ago associated with presyncope. She does drink 2 cups of coffee a day. She denies chest pain or  shortness of breath. I advised her to curtail curtail her caffeine intake. We'll get a 30 day monitor as well as thyroid function tests. I'm also going obtain a 2-D echocardiogram and a exercise Myoview stress test to rule out ischemic etiology. She did have a chronic catheterization performed by Dr. Tami Ribas in 2005 that showed noncritical CAD.      Lorretta Harp MD FACP,FACC,FAHA, [facility] 01/21/2016 11:26 AM

## 2016-01-21 NOTE — Assessment & Plan Note (Signed)
History of hyperlipidemia on statin therapy with recent lipid profile performed 12/25/15 revealed total cholesterol 135, LDL 68 and HDL 45.

## 2016-01-21 NOTE — Assessment & Plan Note (Signed)
Bonnie Brady is a 65 year old mild to moderately overweight married Caucasian female who presents today for evaluation of recent onset tachycardia palpitations with presyncope. She's had 3 episodes over the last week 2 of which wakened from sleep and 1 which occurred several days ago associated with presyncope. She does drink 2 cups of coffee a day. She denies chest pain or shortness of breath. I advised her to curtail curtail her caffeine intake. We'll get a 30 day monitor as well as thyroid function tests. I'm also going obtain a 2-D echocardiogram and a exercise Myoview stress test to rule out ischemic etiology. She did have a chronic catheterization performed by Dr. Tami Ribas in 2005 that showed noncritical CAD.

## 2016-01-23 ENCOUNTER — Telehealth (HOSPITAL_COMMUNITY): Payer: Self-pay

## 2016-01-23 NOTE — Telephone Encounter (Signed)
Encounter complete. 

## 2016-01-28 ENCOUNTER — Ambulatory Visit (HOSPITAL_COMMUNITY)
Admission: RE | Admit: 2016-01-28 | Discharge: 2016-01-28 | Disposition: A | Discharge status: Home / Self Care | Payer: BLUE CROSS/BLUE SHIELD | Source: Ambulatory Visit | Attending: Cardiovascular Disease | Admitting: Cardiovascular Disease

## 2016-01-28 DIAGNOSIS — Z87891 Personal history of nicotine dependence: Secondary | ICD-10-CM | POA: Insufficient documentation

## 2016-01-28 DIAGNOSIS — R002 Palpitations: Secondary | ICD-10-CM | POA: Insufficient documentation

## 2016-01-28 LAB — MYOCARDIAL PERFUSION IMAGING
Estimated workload: 7.8 METS
Exercise duration (min): 7 min
Exercise duration (sec): 30 s
LV dias vol: 59 mL (ref 46–106)
LV sys vol: 20 mL
MPHR: 156 {beats}/min
Peak HR: 155 {beats}/min
Percent HR: 99 %
RPE: 17
Rest HR: 66 {beats}/min
SDS: 2
SRS: 6
SSS: 8
TID: 0.93

## 2016-01-28 MED ORDER — TECHNETIUM TC 99M TETROFOSMIN IV KIT
31.1000 | PACK | Freq: Once | INTRAVENOUS | Status: AC | PRN
  Administered 2016-01-28: 31.1 via INTRAVENOUS
  Filled 2016-01-28: qty 32

## 2016-01-28 MED ORDER — TECHNETIUM TC 99M TETROFOSMIN IV KIT
9.7000 | PACK | Freq: Once | INTRAVENOUS | Status: AC | PRN
  Administered 2016-01-28: 9.7 via INTRAVENOUS
  Filled 2016-01-28: qty 10

## 2016-01-29 ENCOUNTER — Ambulatory Visit (INDEPENDENT_AMBULATORY_CARE_PROVIDER_SITE_OTHER): Payer: BLUE CROSS/BLUE SHIELD

## 2016-01-29 DIAGNOSIS — R002 Palpitations: Secondary | ICD-10-CM | POA: Diagnosis not present

## 2016-02-10 ENCOUNTER — Other Ambulatory Visit: Payer: Self-pay

## 2016-02-10 ENCOUNTER — Ambulatory Visit (HOSPITAL_COMMUNITY): Payer: BLUE CROSS/BLUE SHIELD | Attending: Cardiology

## 2016-02-10 DIAGNOSIS — Z87891 Personal history of nicotine dependence: Secondary | ICD-10-CM | POA: Insufficient documentation

## 2016-02-10 DIAGNOSIS — R002 Palpitations: Secondary | ICD-10-CM | POA: Diagnosis present

## 2016-02-17 ENCOUNTER — Telehealth: Payer: Self-pay | Admitting: Cardiovascular Disease

## 2016-02-17 NOTE — Telephone Encounter (Signed)
I spoke to patient. She notes she has about 10 days left on wearing her event monitor.  She was asking if it would be conceivable to hold the BB for the remaining time while on monitor - she wonders, since she's been symptom & palpitation free since going on the BB, if anything would show up on the monitor. She is also discouraged by unpleasant SE's of metoprolol - notes extreme fatigue when first starting (which she partly remedied by moving dose to PM instead of AM), and brain "fogginess". States if she were to have symptoms she'd resume the medication.  Inquiry routed at her request. Pt aware Dr. Gwenlyn Found may advise to remain on metoprolol w/o interruption or wait until f/u appt for changes.  She returns for OV on 12/5 to discuss monitor findings.

## 2016-02-17 NOTE — Telephone Encounter (Signed)
We can discuss procedure back on 12/5.

## 2016-02-17 NOTE — Telephone Encounter (Signed)
Patient informed of recommendations to continue medical therapy, voiced understanding and thanks. Aware to call back if further questions or needs.

## 2016-02-17 NOTE — Telephone Encounter (Signed)
New message       Pt c/o medication issue:  1. Name of Medication: metoprolol 2. How are you currently taking this medication (dosage and times per day)?  25mg  daily 3. Are you having a reaction (difficulty breathing--STAT)?  no  4. What is your medication issue? Pt want to stop medication while she is wearing her heart monitor to see if her heart is ok.  She states that she has had lots of test and they have been neg.

## 2016-03-03 ENCOUNTER — Ambulatory Visit (INDEPENDENT_AMBULATORY_CARE_PROVIDER_SITE_OTHER): Payer: BLUE CROSS/BLUE SHIELD | Admitting: Cardiovascular Disease

## 2016-03-03 ENCOUNTER — Encounter: Payer: Self-pay | Admitting: Cardiovascular Disease

## 2016-03-03 DIAGNOSIS — R002 Palpitations: Secondary | ICD-10-CM

## 2016-03-03 NOTE — Patient Instructions (Signed)
Medication Instructions: Start coming off metoprolol per Dr. Kennon Holter instructions.   Follow-Up: Your physician wants you to follow-up in: 6 months with Dr. Gwenlyn Found. You will receive a reminder letter in the mail two months in advance. If you don't receive a letter, please call our office to schedule the follow-up appointment.  If you need a refill on your cardiac medications before your next appointment, please call your pharmacy.

## 2016-03-03 NOTE — Assessment & Plan Note (Signed)
Bonnie Brady returns today for follow-up of workup for palpitations and presyncope. I did place her on a low-dose beta blocker. Her 2-D echo and Myoview stress tests were entirely normal. Her monitor showed no evidence of tachycardia or bradycardia arrhythmias. She did have a short episode of AV dissociation. Since I saw her initially she's had no recurrent symptoms. I've told her that she can wean off her beta blocker next several weeks. I will see her back in 6 months for follow-up.

## 2016-03-03 NOTE — Progress Notes (Signed)
Bonnie Brady returns today for follow-up of workup for palpitations and presyncope. I did place her on a low-dose beta blocker. Her 2-D echo and Myoview stress tests were entirely normal. Her monitor showed no evidence of tachycardia or bradycardia arrhythmias. She did have a short episode of AV dissociation. Since I saw her initially she's had no recurrent symptoms. I've told her that she can wean off her beta blocker next several weeks. I will see her back in 6 months for follow-up.

## 2016-03-06 ENCOUNTER — Other Ambulatory Visit: Payer: Self-pay | Admitting: Family

## 2016-03-06 MED ORDER — ROSUVASTATIN CALCIUM 10 MG PO TABS: 10.0000 mg | ORAL_TABLET | Freq: Every day | ORAL | 1 refills | Status: DC

## 2016-03-06 NOTE — Telephone Encounter (Signed)
Medication filled to pharmacy as requested.   

## 2016-03-06 NOTE — Telephone Encounter (Signed)
Caller name: Relationship to patient: Self Can be reached: [phone]  Pharmacy:  [id number] - [place], [address] [phone] (Phone) [phone] (Fax)     Reason for call: Refill rosuvastatin (CRESTOR) 10 MG tablet [id number] Patient will be out of town for 3 weeks and need to get this today if possible. Plse call patient when Rx sent

## 2016-08-14 DIAGNOSIS — H16042 Marginal corneal ulcer, left eye: Secondary | ICD-10-CM | POA: Diagnosis not present

## 2016-08-17 DIAGNOSIS — H16042 Marginal corneal ulcer, left eye: Secondary | ICD-10-CM | POA: Diagnosis not present

## 2016-08-28 ENCOUNTER — Telehealth: Payer: Self-pay | Admitting: *Deleted

## 2016-08-28 MED ORDER — ROSUVASTATIN CALCIUM 10 MG PO TABS: 10.0000 mg | ORAL_TABLET | Freq: Every day | ORAL | 0 refills | Status: DC

## 2016-08-28 NOTE — Telephone Encounter (Signed)
Received [facility] from [facility] requesting refill of crestor. Refill sent but pt is due for fasting physical 09/23/16 or after. Please call pt to schedule appointment. Thanks!

## 2016-09-02 NOTE — Telephone Encounter (Signed)
Left VM for patient to call and schedule CPE

## 2016-09-09 DIAGNOSIS — Z1231 Encounter for screening mammogram for malignant neoplasm of breast: Secondary | ICD-10-CM | POA: Diagnosis not present

## 2016-09-09 LAB — HM MAMMOGRAPHY: HM Mammogram: NORMAL (ref 0–4)

## 2016-09-11 ENCOUNTER — Encounter: Payer: Self-pay | Admitting: Cardiovascular Disease

## 2016-09-11 ENCOUNTER — Ambulatory Visit (INDEPENDENT_AMBULATORY_CARE_PROVIDER_SITE_OTHER): Payer: Medicare Other | Admitting: Cardiovascular Disease

## 2016-09-11 DIAGNOSIS — E7800 Pure hypercholesterolemia, unspecified: Secondary | ICD-10-CM | POA: Diagnosis not present

## 2016-09-11 DIAGNOSIS — R002 Palpitations: Secondary | ICD-10-CM | POA: Diagnosis not present

## 2016-09-11 NOTE — Assessment & Plan Note (Signed)
History of hyperlipidemia on statin therapy followed by her PCP. 

## 2016-09-11 NOTE — Assessment & Plan Note (Signed)
History of tachycardia Palpitations retrospect probably related to dietary indiscretion on beta blockers briefly which has since been weaned off. She's had no recurrence.

## 2016-09-11 NOTE — Patient Instructions (Signed)
Medication Instructions: Your physician recommends that you continue on your current medications as directed. Please refer to the Current Medication list given to you today.   Follow-Up: Your physician recommends that you schedule a follow-up appointment as needed with Dr. Berry.    

## 2016-09-11 NOTE — Progress Notes (Signed)
09/11/2016 Bonnie Brady   [birth year]  [id number]  Primary Physician Bonnie Alar, NP Primary Cardiologist: Bonnie Harp MD Renae Gloss  HPI:  [name] is a 66 year old mild to moderately overweight married Caucasian female mother of 2, grandmother and 6 grandchildren whose husband Bonnie Brady is also patient of mine. Her daughter Bonnie Brady was a NP and worked for [place] in the past, currently working working for [employer]. She was referred by her PCP, Bonnie Brady, for cardiac evaluation because of new onset tachycardia palpitations and presyncope. Her only risk factor is treated hyperlipidemia. She's never had a heart attack or stroke. She denies chest pain or shortness of breath. She did drink several cups of coffee a day and does have a remote history of tobacco abuse having smoked 40 pack years and stopped 7 years ago. Her 2-D echo Myoview stress tests were normal. Her monitor showed no evidence for tachycardia or bradycardia arrhythmias although she did have a short episode of AV dissociation. I did begin her on beta blockers initially and when I saw her back 6 months ago her symptoms have resolved. She has  slowly weaned herself off the beta blocker and remains asymptomatic.    Current Outpatient Prescriptions  Medication Sig Dispense Refill  . CALCIUM PO Take 400 Units by mouth 2 (two) times daily.    . Cholecalciferol (VITAMIN D-3) 1000 UNITS CAPS Take 2 capsules by mouth daily.     . Magnesium 500 MG TABS Take 500 mg by mouth daily.    . NON FORMULARY Take by mouth daily. Juice Plus Supplement Whole Foods [total 6 daily]    . PRESCRIPTION MEDICATION Apply 0.2 mg topically daily. BI-EST (6:4) w/progest 0:15 + 8% Cream    . rosuvastatin (CRESTOR) 10 MG tablet Take 1 tablet (10 mg total) by mouth daily. 90 tablet 0   No current facility-administered medications for this visit.     Allergies  Allergen Reactions  . Ampicillin Hives    Questionable.  .  Latex Other (See Comments)    Contact with powder.  . Tetanus Toxoids Other (See Comments)    Swelling / redness  . Nickel Rash    Blisters    Social History   Social History  . Marital status: Married    Spouse name: N/A  . Number of children: N/A  . Years of education: N/A   Occupational History  . Not on file.   Social History Main Topics  . Smoking status: Former Smoker    Types: Cigarettes    Quit date: 03/30/2008  . Smokeless tobacco: Never Used  . Alcohol use Yes     Comment: occasional < 1 a week  . Drug use: Unknown  . Sexual activity: Not on file   Other Topics Concern  . Not on file   Social History Narrative   Has worked as Engineering geologist- stopped working 2011.    Married   2 children   Enjoys Airline pilot           Review of Systems: General: negative for chills, fever, night sweats or weight changes.  Cardiovascular: negative for chest pain, dyspnea on exertion, edema, orthopnea, palpitations, paroxysmal nocturnal dyspnea or shortness of breath Dermatological: negative for rash Respiratory: negative for cough or wheezing Urologic: negative for hematuria Abdominal: negative for nausea, vomiting, diarrhea, bright red blood per rectum, melena, or hematemesis Neurologic: negative for visual changes, syncope, or dizziness All other systems  reviewed and are otherwise negative except as noted above.    Blood pressure 118/68, pulse 74, height 5\' 7"  (1.702 m), weight 170 lb 12.8 oz (77.5 kg).  General appearance: alert and no distress Neck: no adenopathy, no carotid bruit, no JVD, supple, symmetrical, trachea midline and thyroid not enlarged, symmetric, no tenderness/mass/nodules Lungs: clear to auscultation bilaterally Heart: regular rate and rhythm, S1, S2 normal, no murmur, click, rub or gallop Extremities: extremities normal, atraumatic, no cyanosis or edema  EKG normal sinus at 74 with borderline LVH voltage. I  personally reviewed this EKG  ASSESSMENT AND PLAN:   Palpitations History of tachycardia Palpitations retrospect probably related to dietary indiscretion on beta blockers briefly which has since been weaned off. She's had no recurrence.  Hyperlipidemia History of hyperlipidemia on statin therapy followed by her PCP      Bonnie Harp MD [facility], [facility] 09/11/2016 9:49 AM

## 2016-09-23 ENCOUNTER — Encounter: Payer: Self-pay | Admitting: Behavioral Health

## 2016-09-23 ENCOUNTER — Telehealth: Payer: Self-pay | Admitting: Behavioral Health

## 2016-09-23 NOTE — Telephone Encounter (Signed)
Pre-Visit Call completed with patient and chart updated.   Pre-Visit Info documented in Specialty Comments under [employer].    

## 2016-09-23 NOTE — Telephone Encounter (Signed)
Unable to reach patient at time of Pre-Visit Call.  Left message for patient to return call when available.    

## 2016-09-23 NOTE — Telephone Encounter (Signed)
Patient returning call best # [phone]

## 2016-09-24 ENCOUNTER — Ambulatory Visit (INDEPENDENT_AMBULATORY_CARE_PROVIDER_SITE_OTHER): Payer: Medicare Other | Admitting: Family

## 2016-09-24 ENCOUNTER — Encounter: Payer: Self-pay | Admitting: Family

## 2016-09-24 VITALS — BP 110/60 | HR 69 | Temp 98.0°F | Resp 16 | Ht 67.0 in | Wt 170.0 lb

## 2016-09-24 DIAGNOSIS — E785 Hyperlipidemia, unspecified: Secondary | ICD-10-CM

## 2016-09-24 DIAGNOSIS — Z23 Encounter for immunization: Secondary | ICD-10-CM | POA: Diagnosis not present

## 2016-09-24 DIAGNOSIS — Z0000 Encounter for general adult medical examination without abnormal findings: Secondary | ICD-10-CM

## 2016-09-24 LAB — COMPREHENSIVE METABOLIC PANEL
ALT: 20 U/L (ref 0–35)
AST: 18 U/L (ref 0–37)
Albumin: 4.6 g/dL (ref 3.5–5.2)
Alkaline Phosphatase: 66 U/L (ref 39–117)
BUN: 17 mg/dL (ref 6–23)
CO2: 27 mEq/L (ref 19–32)
Calcium: 10.2 mg/dL (ref 8.4–10.5)
Chloride: 104 mEq/L (ref 96–112)
Creatinine, Ser: 0.92 mg/dL (ref 0.40–1.20)
GFR: 65.02 mL/min (ref >60.00)
Glucose, Bld: 98 mg/dL (ref 70–99)
Potassium: 4.1 mEq/L (ref 3.5–5.1)
Sodium: 139 mEq/L (ref 135–145)
Total Bilirubin: 0.4 mg/dL (ref 0.2–1.2)
Total Protein: 7.2 g/dL (ref 6.0–8.3)

## 2016-09-24 LAB — LIPID PANEL
Cholesterol: 157 mg/dL (ref 0–200)
HDL: 51.6 mg/dL (ref >39.00)
LDL Cholesterol: 71 mg/dL (ref 0–99)
NonHDL: 105.84
Total CHOL/HDL Ratio: 3
Triglycerides: 176 mg/dL — ABNORMAL HIGH (ref 0.0–149.0)
VLDL: 35.2 mg/dL (ref 0.0–40.0)

## 2016-09-24 NOTE — Patient Instructions (Signed)
Please complete lab work prior to leaving.   Continue to work on healthy diet and exercise.  

## 2016-09-24 NOTE — Addendum Note (Signed)
Addended by: Kelle Darting A on: 09/24/2016 04:33 PM   Modules accepted: Orders

## 2016-09-24 NOTE — Progress Notes (Signed)
Subjective:    Bonnie Brady is a 66 y.o. female who presents for a Welcome to Medicare exam.   Review of Systems  Patient presents today for complete physical.  Immunizations: declines shingrix due to swelling from zostavax previously. allergic to tetanus Diet: healthy Exercise:  Gym 4 times a week Colonoscopy: 10/10/14 Dexa: 9/16 Pap Smear: Mammogram: 09/09/16- done by gyn at [facility] that she saw Dr. Gwenlyn Found for palpitations and had neg work up.         Objective:    Today's Vitals   09/24/16 1126  BP: 110/60  Pulse: 69  Resp: 16  Temp: 98 F (36.7 C)  TempSrc: Oral  Weight: 170 lb (77.1 kg)  Height: 5\' 7"  (1.702 m)  Body mass index is 26.63 kg/m.  Medications Outpatient Encounter Prescriptions as of 09/24/2016  Medication Sig  . CALCIUM PO Take 400 Units by mouth 2 (two) times daily.  . Cholecalciferol (VITAMIN D-3) 1000 UNITS CAPS Take 1 capsule by mouth 2 (two) times daily.   . Magnesium 500 MG TABS Take 500 mg by mouth daily.  . NON FORMULARY Take by mouth daily. Juice Plus Supplement Whole Foods [total 6 daily]  . NON FORMULARY 1 capsule 2 (two) times daily. Juice Plus Omega Supplement Whole Foods  . Nutritional Supplements (DHEA PO) Take 5 mg by mouth daily.  Marland Kitchen PRESCRIPTION MEDICATION Apply 0.2 mg topically daily. BI-EST (6.4) w/ progest 0.2 + 9% Cream  . rosuvastatin (CRESTOR) 10 MG tablet Take 1 tablet (10 mg total) by mouth daily.   No facility-administered encounter medications on file as of 09/24/2016.      History: Past Medical History:  Diagnosis Date  . Hyperlipidemia   . Kidney stone   . Tachycardia   . Varicose veins    Past Surgical History:  Procedure Laterality Date  . APPENDECTOMY  1971  . bladder strap  2006  . [place]  . laser of veins  2010   also in 2016 and 2017  . TONSILLECTOMY  1958    Family History  Problem Relation Age of Onset  . Breast cancer Paternal Grandmother   . Stroke Mother     . Stroke Maternal Grandfather   . COPD Father   . COPD Brother    Social History   Occupational History  . Not on file.   Social History Main Topics  . Smoking status: Former Smoker    Types: Cigarettes    Quit date: 03/30/2008  . Smokeless tobacco: Never Used  . Alcohol use Yes     Comment: occasional < 1 a week  . Drug use: No  . Sexual activity: Not on file    Tobacco Counseling Counseling given: Not Answered   Immunizations and Health Maintenance Immunization History  Administered Date(s) Administered  . Influenza, Seasonal, Injecte, Preservative Fre 01/08/2014  . Influenza-Unspecified 12/29/2015  . Tdap 03/30/2006  . Zoster 11/16/2011   Health Maintenance Due  Topic Date Due  . PNA vac Low Risk Adult (1 of 2 - PCV13) 04/14/2016    Activities of Daily Living In your present state of health, do you have any difficulty performing the following activities: 09/24/2016  Hearing? N  Vision? N  Difficulty concentrating or making decisions? N  Walking or climbing stairs? N  Dressing or bathing? N  Doing errands, shopping? N  Some recent data might be hidden    Physical Exam    Physical Exam  Constitutional:  She is oriented to person, place, and time. She appears well-developed and well-nourished. No distress.  HENT:  Head: Normocephalic and atraumatic.  Right Ear: Tympanic membrane and ear canal normal.  Left Ear: Tympanic membrane and ear canal normal.  Mouth/Throat: Oropharynx is clear and moist.  Eyes: Pupils are equal, round, and reactive to light. No scleral icterus.  Neck: Normal range of motion. No thyromegaly present.  Cardiovascular: Normal rate and regular rhythm.   No murmur heard. Pulmonary/Chest: Effort normal and breath sounds normal. No respiratory distress. He has no wheezes. She has no rales. She exhibits no tenderness.  Abdominal: Soft. Bowel sounds are normal. She exhibits no distension and no mass. There is no tenderness. There is no  rebound and no guarding.  Musculoskeletal: She exhibits no edema.  Lymphadenopathy:    She has no cervical adenopathy.  Neurological: She is alert and oriented to person, place, and time. She has normal patellar reflexes. She exhibits normal muscle tone. Coordination normal.  Skin: Skin is warm and dry.  Psychiatric: She has a normal mood and affect. Her behavior is normal. Judgment and thought content normal.  Breasts: Examined lying Right: Without masses, retractions, discharge or axillary adenopathy.  Left: Without masses, retractions, discharge or axillary adenopathy.  I           Assessment & Plan:     Advanced Directives: has HCPOA, requested copy.      Assessment:    This is a routine wellness examination for this patient .   Vision/Hearing screen  Visual Acuity Screening   Right eye Left eye Both eyes  Without correction:     With correction: 20/20  20/20  Comments: Has contact in left eye for magnification (short distance)   Dietary issues and exercise activities discussed:  Continue to work on healthy diet, regular execise.     Goals    None     Depression Screen No flowsheet data found.   Fall Risk Fall Risk  09/24/2016  Falls in the past year? No    Cognitive Function: MMSE - Mini Mental State Exam 09/24/2016 09/24/2016  Orientation to time 5 (No Data)  Orientation to time comments - yes  Orientation to Place 5 -  Registration 3 -  Attention/ Calculation 5 -  Recall 3 -  Language- name 2 objects 2 -  Language- repeat 1 -  Language- follow 3 step command 3 -  Language- read & follow direction 1 -  Write a sentence 1 -  Copy design 1 -  Total score 30 -    Denies concerns about memory, see MMSE    Patient Care Team: [name], NP as PCP - General (Internal Medicine) Servando Salina, MD as Consulting Physician (Obstetrics and Gynecology) Roseanne Kaufman, MD as Consulting Physician (Orthopedic Surgery) Lorretta Harp, MD  as Consulting Physician (Cardiology) Juanita Craver, MD as Consulting Physician (Gastroenterology)     Plan:    I have personally reviewed and noted the following in the patient's chart:   . Medical and social history . Use of alcohol, tobacco or illicit drugs  . Current medications and supplements . Functional ability and status . Nutritional status . Physical activity . Advanced directives . List of other physicians . Hospitalizations, surgeries, and ER visits in previous 12 months . Vitals . Screenings to include cognitive, depression, and falls . Referrals and appointments  In addition, I have reviewed and discussed with patient certain preventive protocols, quality metrics, and best practice recommendations. A  written personalized care plan for preventive services as well as general preventive health recommendations were provided to patient.   Prevnar today.   O'SULLIVAN,Glendia Olshefski S., NP 09/24/2016

## 2016-09-25 ENCOUNTER — Encounter: Payer: Self-pay | Admitting: Family

## 2016-11-25 ENCOUNTER — Other Ambulatory Visit: Payer: Self-pay | Admitting: Family

## 2017-01-13 DIAGNOSIS — Z23 Encounter for immunization: Secondary | ICD-10-CM | POA: Diagnosis not present

## 2017-03-01 ENCOUNTER — Other Ambulatory Visit: Payer: Self-pay | Admitting: Family

## 2017-03-01 DIAGNOSIS — Z01419 Encounter for gynecological examination (general) (routine) without abnormal findings: Secondary | ICD-10-CM | POA: Diagnosis not present

## 2017-03-01 DIAGNOSIS — Z124 Encounter for screening for malignant neoplasm of cervix: Secondary | ICD-10-CM | POA: Diagnosis not present

## 2017-03-01 MED ORDER — ROSUVASTATIN CALCIUM 10 MG PO TABS: 10.0000 mg | ORAL_TABLET | Freq: Every day | ORAL | 1 refills | Status: DC

## 2017-03-01 NOTE — Telephone Encounter (Signed)
Pt last seen 09/24/16 and has no future appt scheduled. Please advise when pt should follow up? Crestor refill sent.

## 2017-03-01 NOTE — Telephone Encounter (Signed)
1 yr follow up. Refills sent.

## 2017-03-02 NOTE — Telephone Encounter (Signed)
Pt will be due for CPE  09/24/17. Mychart reminder sent to pt.

## 2017-05-11 ENCOUNTER — Ambulatory Visit (INDEPENDENT_AMBULATORY_CARE_PROVIDER_SITE_OTHER): Payer: Medicare Other | Admitting: Family

## 2017-05-11 ENCOUNTER — Encounter: Payer: Self-pay | Admitting: Family

## 2017-05-11 VITALS — BP 135/85 | HR 73 | Temp 98.2°F | Resp 16 | Ht 67.0 in | Wt 179.4 lb

## 2017-05-11 DIAGNOSIS — J0100 Acute maxillary sinusitis, unspecified: Secondary | ICD-10-CM

## 2017-05-11 MED ORDER — AMOXICILLIN-POT CLAVULANATE 875-125 MG PO TABS
1.0000 | ORAL_TABLET | Freq: Two times a day (BID) | ORAL | 0 refills | Status: DC
Start: 2017-05-11 — End: 2017-09-20

## 2017-05-11 NOTE — Progress Notes (Signed)
Subjective:    Patient ID: Bonnie Brady, female    DOB: 01-May-1950, 67 y.o.   MRN: [id number]  HPI  Patient is a 67 year old female who presents today with complaint of 2-week history of sinus drainage.  She also reports severe left ear pain which developed this morning.  She has been using Sudafed and a Nettie pot without significant improvement in her symptoms. Has left maxillary sinus pain. Denies fever.  Patient notes that ampicillin allergy is listed on her chart but that this is a questionable allergy.  She thinks that she was really allergic to the latex gloves at the time and not the abx. Reports that she has taken amoxicillin, augmentin, and pen VK in the past without problem.  Review of Systems See HPI  Past Medical History:  Diagnosis Date  . Hyperlipidemia   . Kidney stone   . Tachycardia   . Varicose veins      Social History   Socioeconomic History  . Marital status: Married    Spouse name: Not on file  . Number of children: Not on file  . Years of education: Not on file  . Highest education level: Not on file  Social Needs  . Financial resource strain: Not on file  . Food insecurity - worry: Not on file  . Food insecurity - inability: Not on file  . Transportation needs - medical: Not on file  . Transportation needs - non-medical: Not on file  Occupational History  . Not on file  Tobacco Use  . Smoking status: Former Smoker    Types: Cigarettes    Last attempt to quit: 03/30/2008    Years since quitting: 9.1  . Smokeless tobacco: Never Used  Substance and Sexual Activity  . Alcohol use: Yes    Comment: occasional < 1 a week  . Drug use: No  . Sexual activity: Not on file  Other Topics Concern  . Not on file  Social History Narrative   Has worked as Engineering geologist- stopped working 2011.    Married   2 children   Enjoys Airline pilot       Past Surgical History:  Procedure Laterality Date  . APPENDECTOMY  1971    . bladder strap  2006  . [place]  . laser of veins  2010   also in 2016 and 2017  . TONSILLECTOMY  1958    Family History  Problem Relation Age of Onset  . Breast cancer Paternal Grandmother   . Stroke Mother   . Stroke Maternal Grandfather   . COPD Father   . COPD Brother     Allergies  Allergen Reactions  . Ampicillin Hives    Questionable.  . Latex Other (See Comments)    Contact with powder.  . Tetanus Toxoids Other (See Comments)    Swelling / redness  . Nickel Rash    Blisters    Current Outpatient Medications on File Prior to Visit  Medication Sig Dispense Refill  . CALCIUM PO Take 400 Units by mouth 2 (two) times daily.    . Cholecalciferol (VITAMIN D-3) 1000 UNITS CAPS Take 1 capsule by mouth 2 (two) times daily.     . Magnesium 500 MG TABS Take 500 mg by mouth daily.    . NON FORMULARY Take by mouth daily. Juice Plus Supplement Whole Foods [total 6 daily]    . NON FORMULARY 1 capsule 2 (two) times daily.  Juice Plus Omega Supplement Whole Foods    . PRESCRIPTION MEDICATION Apply 0.2 mg topically daily. BI-EST (6.4) w/ progest 0.2 + 9% Cream    . rosuvastatin (CRESTOR) 10 MG tablet Take 1 tablet (10 mg total) by mouth daily. 90 tablet 1   No current facility-administered medications on file prior to visit.     BP 135/85 (BP Location: Left Arm, Cuff Size: Normal)   Pulse 73   Temp 98.2 F (36.8 C) (Oral)   Resp 16   Ht 5\' 7"  (1.702 m)   Wt 179 lb 6.4 oz (81.4 kg)   SpO2 98%   BMI 28.10 kg/m       Objective:   Physical Exam  Constitutional: She is oriented to person, place, and time. She appears well-developed and well-nourished.  HENT:  Head: Normocephalic and atraumatic.  Right Ear: Tympanic membrane and ear canal normal.  Left Ear: Ear canal normal.  Nose: Right sinus exhibits no maxillary sinus tenderness and no frontal sinus tenderness. Left sinus exhibits maxillary sinus tenderness. Left sinus exhibits no frontal sinus  tenderness.  Serous effusion left without erythema  Cardiovascular: Normal rate, regular rhythm and normal heart sounds.  No murmur heard. Pulmonary/Chest: Effort normal and breath sounds normal. No respiratory distress. She has no wheezes.  Neurological: She is alert and oriented to person, place, and time.  Psychiatric: She has a normal mood and affect. Her behavior is normal. Judgment and thought content normal.          Assessment & Plan:  Maxillary sinusitis- will rx with augmentin. Advised pt to continue flonase, call if new/worsening symptoms or if not  Improved in 2-3 days.

## 2017-05-11 NOTE — Patient Instructions (Signed)
Please begin augmentin for sinusitis.  Call if new/worsening symptoms or if not improved in 3-4 days.

## 2017-08-25 ENCOUNTER — Telehealth: Payer: Self-pay | Admitting: *Deleted

## 2017-08-25 NOTE — Telephone Encounter (Signed)
DEXA scan physician order received, DEXA scan ordered to follow up existing osteoporosis from [facility]; forwarded to provider/SLS 05/29

## 2017-09-16 DIAGNOSIS — M8589 Other specified disorders of bone density and structure, multiple sites: Secondary | ICD-10-CM | POA: Diagnosis not present

## 2017-09-16 DIAGNOSIS — Z1231 Encounter for screening mammogram for malignant neoplasm of breast: Secondary | ICD-10-CM | POA: Diagnosis not present

## 2017-09-16 DIAGNOSIS — Z803 Family history of malignant neoplasm of breast: Secondary | ICD-10-CM | POA: Diagnosis not present

## 2017-09-16 LAB — HM MAMMOGRAPHY

## 2017-09-16 LAB — HM DEXA SCAN: HM Dexa Scan: -2.6

## 2017-09-20 ENCOUNTER — Telehealth: Payer: Self-pay | Admitting: *Deleted

## 2017-09-20 ENCOUNTER — Ambulatory Visit (INDEPENDENT_AMBULATORY_CARE_PROVIDER_SITE_OTHER): Payer: Medicare Other | Admitting: Family

## 2017-09-20 ENCOUNTER — Encounter: Payer: Self-pay | Admitting: Family

## 2017-09-20 VITALS — BP 129/81 | HR 67 | Temp 98.0°F | Resp 16 | Ht 66.5 in | Wt 177.0 lb

## 2017-09-20 DIAGNOSIS — E785 Hyperlipidemia, unspecified: Secondary | ICD-10-CM | POA: Diagnosis not present

## 2017-09-20 DIAGNOSIS — Z0000 Encounter for general adult medical examination without abnormal findings: Secondary | ICD-10-CM

## 2017-09-20 NOTE — Patient Instructions (Signed)
Please complete lab work prior to leaving.  Continue your work on healthy diet, exercise and weight loss.  

## 2017-09-20 NOTE — Telephone Encounter (Signed)
Received Bone Density results from [facility]; forwarded to provider/SLS 06/24   

## 2017-09-20 NOTE — Progress Notes (Signed)
Subjective:    Bonnie Brady is a 67 y.o. female who presents for Medicare Annual/Subsequent preventive examination.  Preventive Screening-Counseling & Management  Tobacco Social History   Tobacco Use  Smoking Status Former Smoker  . Types: Cigarettes  . Last attempt to quit: 03/30/2008  . Years since quitting: 9.4  Smokeless Tobacco Never Used     Problems Prior to Visit 1. Hyperlipidemia 2. Osteoporosis (completed dexa, results pending) Current Problems (verified) Patient Active Problem List   Diagnosis Date Noted  . Palpitations 01/21/2016  . Onychomycosis 09/25/2014  . Hyperlipidemia 08/05/2013  . Osteoporosis 12/11/2011  . Routine general medical examination at a health care facility 11/16/2011  . Abnormal EKG 11/16/2011    Medications Prior to Visit Current Outpatient Medications on File Prior to Visit  Medication Sig Dispense Refill  . CALCIUM PO Take 400 Units by mouth 2 (two) times daily.    . Cholecalciferol (VITAMIN D-3) 1000 UNITS CAPS Take 1 capsule by mouth 2 (two) times daily.     . Magnesium 500 MG TABS Take 500 mg by mouth daily.    . NON FORMULARY Take by mouth daily. Juice Plus Supplement Whole Foods [total 6 daily]    . NON FORMULARY 1 capsule 2 (two) times daily. Juice Plus Omega Supplement Whole Foods    . OVER THE COUNTER MEDICATION ALGEACAL 360mg .  Take 2 capsule by mouth once a day.    Marland Kitchen PRESCRIPTION MEDICATION Apply 0.2 mg topically daily. BI-EST (6.4) w/ progest 0.2 + 9% Cream    . rosuvastatin (CRESTOR) 10 MG tablet Take 1 tablet (10 mg total) by mouth daily. 90 tablet 1   No current facility-administered medications on file prior to visit.     Current Medications (verified) Current Outpatient Medications  Medication Sig Dispense Refill  . CALCIUM PO Take 400 Units by mouth 2 (two) times daily.    . Cholecalciferol (VITAMIN D-3) 1000 UNITS CAPS Take 1 capsule by mouth 2 (two) times daily.     . Magnesium 500 MG TABS Take 500 mg by mouth  daily.    . NON FORMULARY Take by mouth daily. Juice Plus Supplement Whole Foods [total 6 daily]    . NON FORMULARY 1 capsule 2 (two) times daily. Juice Plus Omega Supplement Whole Foods    . OVER THE COUNTER MEDICATION ALGEACAL 360mg .  Take 2 capsule by mouth once a day.    Marland Kitchen PRESCRIPTION MEDICATION Apply 0.2 mg topically daily. BI-EST (6.4) w/ progest 0.2 + 9% Cream    . rosuvastatin (CRESTOR) 10 MG tablet Take 1 tablet (10 mg total) by mouth daily. 90 tablet 1   No current facility-administered medications for this visit.      Allergies (verified) Ampicillin; Latex; Tetanus toxoids; and Nickel   PAST HISTORY  Family History Family History  Problem Relation Age of Onset  . Breast cancer Paternal Grandmother   . Stroke Mother   . Stroke Maternal Grandfather   . COPD Father   . COPD Brother   . Breast cancer Daughter     Social History Social History   Tobacco Use  . Smoking status: Former Smoker    Types: Cigarettes    Last attempt to quit: 03/30/2008    Years since quitting: 9.4  . Smokeless tobacco: Never Used  Substance Use Topics  . Alcohol use: Yes    Comment: occasional < 1 a week    Immunizations: can't take tetanus due to hx of reaction Diet:  Trying to eat  healthy Wt Readings from Last 3 Encounters:  09/20/17 177 lb (80.3 kg)  05/11/17 179 lb 6.4 oz (81.4 kg)  09/24/16 170 lb (77.1 kg)  Exercise: some Colonoscopy: 2016 Dexa: 6/19 Pap Smear: hysterectomy Mammogram: 09/16/17    Are there smokers in your home (other than you)? No  Risk Factors Current exercise habits: Gym/ health club routine includes elliptical/weights.  Dietary issues discussed: healthy diet  Cardiac risk factors: advanced age (older than 4 for men, 62 for women).  Depression Screen (Note: if answer to either of the following is "Yes", a more complete depression screening is indicated)   Over the past two weeks, have you felt down, depressed or hopeless? No  Over the past two  weeks, have you felt little interest or pleasure in doing things? No  Have you lost interest or pleasure in daily life? No  Do you often feel hopeless? No  Do you cry easily over simple problems? No  Activities of Daily Living In your present state of health, do you have any difficulty performing the following activities?:  Driving? No Managing money?  No Feeding yourself? No Getting from bed to chair? No Climbing a flight of stairs? No Preparing food and eating?: No Bathing or showering? No Getting dressed: No Getting to the toilet? No Using the toilet:No Moving around from place to place: No In the past year have you fallen or had a near fall?:No   Are you sexually active?  Yes  Do you have more than one partner?  No  Hearing Difficulties: No Do you often ask people to speak up or repeat themselves? No Do you experience ringing or noises in your ears? No Do you have difficulty understanding soft or whispered voices? No   Do you feel that you have a problem with memory? No  Do you often misplace items? No  Do you feel safe at home?  Yes  Cognitive Testing  Alert? Yes  Normal Appearance?Yes  Oriented to person? Yes  Place? Yes   Time? Yes  Recall of three objects?  Yes  Can perform simple calculations? Yes  Displays appropriate judgment?Yes  Can read the correct time from a watch face?Yes   Advanced Directives have been discussed with the patient? Yes  List the Names of Other Physician/Practitioners you currently use: Quay Burow MD  Indicate any recent Medical Services you may have received from other than [facility] providers in the past year (date may be approximate).  Immunization History  Administered Date(s) Administered  . Influenza, High Dose Seasonal PF 01/13/2017  . Influenza, Seasonal, Injecte, Preservative Fre 01/08/2014  . Influenza-Unspecified 12/29/2015  . Pneumococcal Conjugate-13 09/24/2016  . Tdap 03/30/2006  . Zoster 11/16/2011    Screening  Tests Health Maintenance  Topic Date Due  . Hepatitis C Screening  09/23/2020 (Originally 1950/05/24)  . PNA vac Low Risk Adult (2 of 2 - PPSV23) 09/24/2017  . INFLUENZA VACCINE  10/28/2017  . MAMMOGRAM  09/10/2018  . COLONOSCOPY  10/09/2024  . DEXA SCAN  Completed    All answers were reviewed with the patient and necessary referrals were made:  Nance Pear, NP   09/20/2017   History reviewed: allergies, current medications, past family history, past medical history, past social history, past surgical history and problem list  Review of Systems Pertinent items are noted in HPI.      Visual Acuity Screening   Right eye Left eye Both eyes  Without correction:     With correction:  20/20   Comments: Pt wears contact in right eye for close vision and is unable to reach eye chart with that eye.      Objective:     Body mass index is 28.14 kg/m. BP 129/81 (BP Location: Left Arm, Cuff Size: Normal)   Pulse 67   Temp 98 F (36.7 C) (Oral)   Resp 16   Ht 5' 6.5" (1.689 m)   Wt 177 lb (80.3 kg)   SpO2 100%   BMI 28.14 kg/m    Physical Exam  Constitutional: She is oriented to person, place, and time. She appears well-developed and well-nourished. No distress.  HENT:  Head: Normocephalic and atraumatic.  Right Ear: Tympanic membrane and ear canal normal.  Left Ear: Tympanic membrane and ear canal normal.  Mouth/Throat: Oropharynx is clear and moist.  Eyes: Pupils are equal, round, and reactive to light. No scleral icterus.  Neck: Normal range of motion. No thyromegaly present.  Cardiovascular: Normal rate and regular rhythm.   No murmur heard. Pulmonary/Chest: Effort normal and breath sounds normal. No respiratory distress. He has no wheezes. She has no rales. She exhibits no tenderness.  Abdominal: Soft. Bowel sounds are normal. She exhibits no distension and no mass. There is no tenderness. There is no rebound and no guarding.  Musculoskeletal: She exhibits no  edema.  Lymphadenopathy:    She has no cervical adenopathy.  Neurological: She is alert and oriented to person, place, and time. She has normal patellar reflexes. She exhibits normal muscle tone. Coordination normal.  Skin: Skin is warm and dry.  Psychiatric: She has a normal mood and affect. Her behavior is normal. Judgment and thought content normal.           Assessment & Plan:     Assessment:      Plan:     During the course of the visit the patient was educated and counseled about appropriate screening and preventive services including:    Advanced directives: has NO advanced directive  - add't info requested. Referral to SW: no, Given copy of the [place] advanced directive paperwork We are 4 days early for pneumovax 23 for insurance coverage purposes and I let the patient know she is welcome to schedule a nurse visit at her convenience for this.   Diet review for nutrition referral? Yes ____  Not Indicated _x___   Patient Instructions (the written plan) was given to the patient.  Medicare Attestation I have personally reviewed: The patient's medical and social history Their use of alcohol, tobacco or illicit drugs Their current medications and supplements The patient's functional ability including ADLs,fall risks, home safety risks, cognitive, and hearing and visual impairment Diet and physical activities Evidence for depression or mood disorders  The patient's weight, height, BMI, and visual acuity have been recorded in the chart.  I have made referrals, counseling, and provided education to the patient based on review of the above and I have provided the patient with a written personalized care plan for preventive services.     Nance Pear, NP   09/20/2017

## 2017-09-21 LAB — LIPID PANEL
Cholesterol: 170 mg/dL (ref 0–200)
HDL: 44.3 mg/dL (ref >39.00)
NonHDL: 125.78
Total CHOL/HDL Ratio: 4
Triglycerides: 236 mg/dL — ABNORMAL HIGH (ref 0.0–149.0)
VLDL: 47.2 mg/dL — ABNORMAL HIGH (ref 0.0–40.0)

## 2017-09-21 LAB — LDL CHOLESTEROL, DIRECT: Direct LDL: 92 mg/dL

## 2017-09-22 ENCOUNTER — Encounter: Payer: Self-pay | Admitting: Family

## 2017-10-06 ENCOUNTER — Encounter: Payer: Self-pay | Admitting: Family

## 2017-10-06 ENCOUNTER — Ambulatory Visit: Payer: Self-pay

## 2017-10-06 NOTE — Telephone Encounter (Signed)
Pt. called to report sinus pressure above the brow area, bilateral facial pain, and left earache, since Monday.  Reported she just returned from a cruise and had been snorkeling.  Denied fever/ chills.  Denied sore throat.  Reported nasal drainage with clear to yellow mucus.  Reported she had laryngitis and cough, but those sx's have subsided.  Denied shortness of breath.  Reported some intermittent dizziness; stated "I know it's related to inner ear problem."  Has been taking Sudafed, Flonase, and Zyrtek.  Reported the discomfort in her ear and face have been manageable.  Reports hx of recurrent sinus infections.  Stated "my sinuses don't drain properly."  Appt. Scheduled for 7/11 @ 9:20 AM with PCP.  Care advice given per protocol.  Verb. understanding.  Agrees with plan.   Reason for Disposition . Earache  Answer Assessment - Initial Assessment Questions 1. LOCATION: "Where does it hurt?"      Upper bilateral facial cheek area, left ear 2. ONSET: "When did the sinus pain start?"  (e.g., hours, days)     Monday 3. SEVERITY: "How bad is the pain?"   (Scale 1-10; mild, moderate or severe)   - MILD (1-3): doesn't interfere with normal activities    - MODERATE (4-7): interferes with normal activities (e.g., work or school) or awakens from sleep   - SEVERE (8-10): excruciating pain and patient unable to do any normal activities       mild 4. RECURRENT SYMPTOM: "Have you ever had sinus problems before?" If so, ask: "When was the last time?" and "What happened that time?"      Yes; recurrent sinus infection  5. NASAL CONGESTION: "Is the nose blocked?" If so, ask, "Can you open it or must you breathe through the mouth?"     Bilateral nasal congestion 6. NASAL DISCHARGE: "Do you have discharge from your nose?" If so ask, "What color?"     Clear to yellow 7. FEVER: "Do you have a fever?" If so, ask: "What is it, how was it measured, and when did it start?"      No  8. OTHER SYMPTOMS: "Do you have any  other symptoms?" (e.g., sore throat, cough, earache, difficulty breathing)     Denied sore throat or shortness of breath; had cough and laryngitis that has subsided ; pressure above brow area, some intermittent dizziness  9. PREGNANCY: "Is there any chance you are pregnant?" "When was your last menstrual period?"    N/a  Protocols used: SINUS PAIN OR CONGESTION-A-AH

## 2017-10-07 ENCOUNTER — Ambulatory Visit (INDEPENDENT_AMBULATORY_CARE_PROVIDER_SITE_OTHER): Payer: Medicare Other | Admitting: Family

## 2017-10-07 ENCOUNTER — Encounter: Payer: Self-pay | Admitting: Family

## 2017-10-07 VITALS — BP 128/74 | HR 80 | Temp 98.2°F | Resp 16 | Ht 66.5 in | Wt 179.8 lb

## 2017-10-07 DIAGNOSIS — J069 Acute upper respiratory infection, unspecified: Secondary | ICD-10-CM | POA: Diagnosis not present

## 2017-10-07 DIAGNOSIS — Z0000 Encounter for general adult medical examination without abnormal findings: Secondary | ICD-10-CM | POA: Diagnosis not present

## 2017-10-07 DIAGNOSIS — Z23 Encounter for immunization: Secondary | ICD-10-CM | POA: Diagnosis not present

## 2017-10-07 MED ORDER — CEFDINIR 300 MG PO CAPS: 300.0000 mg | ORAL_CAPSULE | Freq: Two times a day (BID) | ORAL | 0 refills | Status: DC

## 2017-10-07 NOTE — Progress Notes (Signed)
Subjective:    Patient ID: Bonnie Brady, female    DOB: [birth year], 67 y.o.   MRN: [id number]  HPI   Patient is a 67 yr old female who presents today with chief complaint of dizziness,  Head congestion and maxillary facial pain and left ear pain. Reports that she woke up at 5AM this morning with "pounding" in her left ear. No known fever.  Reports some dizziness with bending forward or laying down. Symptoms began 8 days ago with rhinnorhea.  Using sudafed,zyrtec and flonase, neti pot without singificant improvement. Just restarted flonase.  Review of Systems  See HPI  Past Medical History:  Diagnosis Date  . Hyperlipidemia   . Kidney stone   . Tachycardia   . Varicose veins      Social History   Socioeconomic History  . Marital status: Married    Spouse name: Not on file  . Number of children: Not on file  . Years of education: Not on file  . Highest education level: Not on file  Occupational History  . Not on file  Social Needs  . Financial resource strain: Not on file  . Food insecurity:    Worry: Not on file    Inability: Not on file  . Transportation needs:    Medical: Not on file    Non-medical: Not on file  Tobacco Use  . Smoking status: Former Smoker    Types: Cigarettes    Last attempt to quit: 03/30/2008    Years since quitting: 9.5  . Smokeless tobacco: Never Used  Substance and Sexual Activity  . Alcohol use: Yes    Comment: occasional < 1 a week  . Drug use: No  . Sexual activity: Not on file  Lifestyle  . Physical activity:    Days per week: Not on file    Minutes per session: Not on file  . Stress: Not on file  Relationships  . Social connections:    Talks on phone: Not on file    Gets together: Not on file    Attends religious service: Not on file    Active member of club or organization: Not on file    Attends meetings of clubs or organizations: Not on file    Relationship status: Not on file  . Intimate partner violence:    Fear of  current or ex partner: Not on file    Emotionally abused: Not on file    Physically abused: Not on file    Forced sexual activity: Not on file  Other Topics Concern  . Not on file  Social History Narrative   Has worked as Engineering geologist- stopped working 2011.    Married   2 children   Enjoys Airline pilot       Past Surgical History:  Procedure Laterality Date  . ABDOMINAL HYSTERECTOMY    . APPENDECTOMY  1971  . bladder strap  2006  . [place]  . laser of veins  2010   also in 2016 and 2017  . TONSILLECTOMY  1958    Family History  Problem Relation Age of Onset  . Breast cancer Paternal Grandmother   . Stroke Mother   . Stroke Maternal Grandfather   . COPD Father   . COPD Brother   . Breast cancer Daughter     Allergies  Allergen Reactions  . Ampicillin Hives    Questionable.  . Latex Other (See Comments)  Contact with powder.  . Tetanus Toxoids Other (See Comments)    Swelling / redness  . Nickel Rash    Blisters    Current Outpatient Medications on File Prior to Visit  Medication Sig Dispense Refill  . CALCIUM PO Take 400 Units by mouth 2 (two) times daily.    . Cholecalciferol (VITAMIN D-3) 1000 UNITS CAPS Take 1 capsule by mouth 2 (two) times daily.     . Magnesium 500 MG TABS Take 500 mg by mouth daily.    . NON FORMULARY Take by mouth daily. Juice Plus Supplement Whole Foods [total 6 daily]    . NON FORMULARY 1 capsule 2 (two) times daily. Juice Plus Omega Supplement Whole Foods    . OVER THE COUNTER MEDICATION ALGEACAL 360mg .  Take 2 capsule by mouth twice a day.    Marland Kitchen PRESCRIPTION MEDICATION Apply 0.2 mg topically daily. BI-EST (6.4) w/ progest 0.2 + 9% Cream    . rosuvastatin (CRESTOR) 10 MG tablet Take 1 tablet (10 mg total) by mouth daily. 90 tablet 1   No current facility-administered medications on file prior to visit.     BP 128/74 (BP Location: Left Arm, Cuff Size: Normal)   Pulse 80    Temp 98.2 F (36.8 C) (Oral)   Resp 16   Ht 5' 6.5" (1.689 m)   Wt 179 lb 12.8 oz (81.6 kg)   SpO2 96%   BMI 28.59 kg/m        Objective:   Physical Exam  Constitutional: She appears well-developed and well-nourished.  HENT:  Head: Normocephalic and atraumatic.  Nose: Right sinus exhibits no maxillary sinus tenderness and no frontal sinus tenderness. Left sinus exhibits no maxillary sinus tenderness and no frontal sinus tenderness.  Mouth/Throat: Oropharynx is clear and moist.  Bilateral serous effusions noted both ears no erythema/bulging. Both TM's mildly retracted  Cardiovascular: Normal rate, regular rhythm and normal heart sounds.  No murmur heard. Pulmonary/Chest: Effort normal and breath sounds normal. No respiratory distress. She has no wheezes.  Psychiatric: She has a normal mood and affect. Her behavior is normal. Judgment and thought content normal.          Assessment & Plan:  Viral URI- advised pt that if symptoms worsen or if not improved in 2 days to begin cefdinir to cover for sinusitis.  Continue current OTC meds.  Pt verbalizes understanding.

## 2017-10-07 NOTE — Patient Instructions (Signed)
Continue sudafed, zyrtec and flonase. If symptoms worsen or if not improved in 2 days, please begin cefdinir.

## 2017-10-10 ENCOUNTER — Encounter: Payer: Self-pay | Admitting: Family

## 2017-10-12 ENCOUNTER — Telehealth: Payer: Self-pay | Admitting: *Deleted

## 2017-10-12 NOTE — Telephone Encounter (Signed)
Copied from [place] [phone]. Topic: General - Other >> Oct 12, 2017 10:13 AM Marin Olp L wrote: Reason for CRM: Patient would like the actual radiology report from her bone density exam on 09/20/2017. Patient would like the results mailed to her and to call once mailed.

## 2017-10-12 NOTE — Telephone Encounter (Signed)
Result mailed to pt and she has been  Notified.

## 2017-11-28 ENCOUNTER — Other Ambulatory Visit: Payer: Self-pay | Admitting: Family

## 2018-01-04 DIAGNOSIS — Z23 Encounter for immunization: Secondary | ICD-10-CM | POA: Diagnosis not present

## 2018-02-09 ENCOUNTER — Ambulatory Visit (INDEPENDENT_AMBULATORY_CARE_PROVIDER_SITE_OTHER): Payer: Medicare Other | Admitting: Medical

## 2018-02-09 VITALS — BP 147/84 | HR 91 | Temp 98.3°F | Resp 16 | Ht 66.5 in | Wt 183.4 lb

## 2018-02-09 DIAGNOSIS — R059 Cough, unspecified: Secondary | ICD-10-CM

## 2018-02-09 DIAGNOSIS — R300 Dysuria: Secondary | ICD-10-CM | POA: Diagnosis not present

## 2018-02-09 DIAGNOSIS — H6982 Other specified disorders of Eustachian tube, left ear: Secondary | ICD-10-CM

## 2018-02-09 DIAGNOSIS — R05 Cough: Secondary | ICD-10-CM

## 2018-02-09 DIAGNOSIS — R350 Frequency of micturition: Secondary | ICD-10-CM | POA: Diagnosis not present

## 2018-02-09 DIAGNOSIS — J0100 Acute maxillary sinusitis, unspecified: Secondary | ICD-10-CM | POA: Diagnosis not present

## 2018-02-09 LAB — POC URINALSYSI DIPSTICK (AUTOMATED)
Bilirubin, UA: NEGATIVE
Glucose, UA: NEGATIVE
Ketones, UA: NEGATIVE
Leukocytes, UA: NEGATIVE
Nitrite, UA: NEGATIVE
Protein, UA: NEGATIVE
Spec Grav, UA: >=1.03 — AB (ref 1.010–1.025)
Urobilinogen, UA: NEGATIVE E.U./dL — AB
pH, UA: 6 (ref 5.0–8.0)

## 2018-02-09 MED ORDER — AMOXICILLIN-POT CLAVULANATE 875-125 MG PO TABS: 1.0000 | ORAL_TABLET | Freq: Two times a day (BID) | ORAL | 0 refills | Status: DC

## 2018-02-09 MED ORDER — BENZONATATE 100 MG PO CAPS: 100.0000 mg | ORAL_CAPSULE | Freq: Three times a day (TID) | ORAL | 0 refills | Status: DC | PRN

## 2018-02-09 NOTE — Patient Instructions (Addendum)
Your appear to have a sinus infection. I am prescribing  augmentin antibiotic for the infection. To help with the nasal congestion use flonase. For your associated cough, I prescribed cough medicine benzonatate.  For possible early uti. We did ua and will get culture. Augmentin has some coverage for uti and will follow sensitivity  Some mild amount of blood in urine. Want to repeat urine on follow up.   Rest, hydrate, tylenol for fever.  Follow up in 7-10 days or as needed.

## 2018-02-09 NOTE — Progress Notes (Signed)
Subjective:    Patient ID: Bonnie Brady, female    DOB: 03-20-1951, 67 y.o.   MRN: [id number]  HPI  Pt in for about 10 days of nasal congestion and sinus pressure. Pt sinus pressure and nasal congestion waxed and waned. Yesterday was good day. But today more sinus pressure and some teeth pain. Left ear throbs a little bit,  Pt is currently on flonase and zyrtec.  Some dizziness for 4 days up until yesterday. But then dizziness stopped today.  Also just this morning some burning on urination, some frequent urination and some rt side cva tenderness. Pt states 1979 had kidney stone. Last uti 10 years or more.  Prior use of augmentin and not reaction at all as well as other pcn.  Review of Systems  Constitutional: Negative for chills, fatigue and fever.  HENT: Positive for congestion, postnasal drip, sinus pressure and sinus pain. Negative for sore throat.   Respiratory: Positive for cough. Negative for shortness of breath and wheezing.   Cardiovascular: Negative for chest pain and palpitations.  Gastrointestinal: Negative for abdominal pain.  Genitourinary: Positive for dysuria and frequency. Negative for difficulty urinating, pelvic pain, urgency and vaginal pain.  Musculoskeletal: Negative for back pain.  Skin: Negative for rash.  Neurological: Negative for dizziness, speech difficulty, weakness and headaches.  Hematological: Negative for adenopathy. Does not bruise/bleed easily.  Psychiatric/Behavioral: Negative for behavioral problems, confusion and suicidal ideas. The patient is not nervous/anxious.     Past Medical History:  Diagnosis Date  . Hyperlipidemia   . Kidney stone   . Tachycardia   . Varicose veins      Social History   Socioeconomic History  . Marital status: Married    Spouse name: Not on file  . Number of children: Not on file  . Years of education: Not on file  . Highest education level: Not on file  Occupational History  . Not on file  Social  Needs  . Financial resource strain: Not on file  . Food insecurity:    Worry: Not on file    Inability: Not on file  . Transportation needs:    Medical: Not on file    Non-medical: Not on file  Tobacco Use  . Smoking status: Former Smoker    Types: Cigarettes    Last attempt to quit: 03/30/2008    Years since quitting: 9.8  . Smokeless tobacco: Never Used  Substance and Sexual Activity  . Alcohol use: Yes    Comment: occasional < 1 a week  . Drug use: No  . Sexual activity: Not on file  Lifestyle  . Physical activity:    Days per week: Not on file    Minutes per session: Not on file  . Stress: Not on file  Relationships  . Social connections:    Talks on phone: Not on file    Gets together: Not on file    Attends religious service: Not on file    Active member of club or organization: Not on file    Attends meetings of clubs or organizations: Not on file    Relationship status: Not on file  . Intimate partner violence:    Fear of current or ex partner: Not on file    Emotionally abused: Not on file    Physically abused: Not on file    Forced sexual activity: Not on file  Other Topics Concern  . Not on file  Social History Narrative   Has worked  as medical assistant and sonographer- stopped working 2011.    Married   2 children   Enjoys Airline pilot       Past Surgical History:  Procedure Laterality Date  . ABDOMINAL HYSTERECTOMY    . APPENDECTOMY  1971  . bladder strap  2006  . [place]  . laser of veins  2010   also in 2016 and 2017  . TONSILLECTOMY  1958    Family History  Problem Relation Age of Onset  . Breast cancer Paternal Grandmother   . Stroke Mother   . Stroke Maternal Grandfather   . COPD Father   . COPD Brother   . Breast cancer Daughter     Allergies  Allergen Reactions  . Ampicillin Hives    Questionable. Had a rash  . Latex Other (See Comments)    Contact with powder.  . Tetanus Toxoids Other  (See Comments)    Swelling / redness  . Nickel Rash    Blisters    Current Outpatient Medications on File Prior to Visit  Medication Sig Dispense Refill  . CALCIUM PO Take 400 Units by mouth 2 (two) times daily.    . cefdinir (OMNICEF) 300 MG capsule Take 1 capsule (300 mg total) by mouth 2 (two) times daily. 20 capsule 0  . NON FORMULARY Take by mouth daily. Juice Plus Supplement Whole Foods [total 6 daily]    . NON FORMULARY 1 capsule 2 (two) times daily. Juice Plus Omega Supplement Whole Foods    . OVER THE COUNTER MEDICATION ALGEACAL 360mg .  Take 2 capsule by mouth twice a day.    Marland Kitchen PRESCRIPTION MEDICATION Apply 0.2 mg topically daily. BI-EST (6.4) w/ progest 0.2 + 9% Cream    . rosuvastatin (CRESTOR) 10 MG tablet TAKE 1 TABLET BY MOUTH EVERY DAY 90 tablet 1   No current facility-administered medications on file prior to visit.     BP (!) 147/84   Pulse 91   Temp 98.3 F (36.8 C) (Oral)   Resp 16   Ht 5' 6.5" (1.689 m)   Wt 183 lb 6.4 oz (83.2 kg)   SpO2 98%   BMI 29.16 kg/m       Objective:   Physical Exam  General  Mental Status - Alert. General Appearance - Well groomed. Not in acute distress.  Skin Rashes- No Rashes.  HEENT Head- Normal. Ear Auditory Canal - Left- Normal. Right - Normal.Tympanic Membrane- Left- mild dull. Right- Normal. Eye Sclera/Conjunctiva- Left- Normal. Right- Normal. Nose & Sinuses Nasal Mucosa- Left-  Boggy and Congested. Right-  Boggy and  Congested.Bilateral moderate to severe maxillary and mild  frontal sinus pressure. Mouth & Throat Lips: Upper Lip- Normal: no dryness, cracking, pallor, cyanosis, or vesicular eruption. Lower Lip-Normal: no dryness, cracking, pallor, cyanosis or vesicular eruption. Buccal Mucosa- Bilateral- No Aphthous ulcers. Oropharynx- No Discharge or Erythema. Tonsils: Characteristics- Bilateral- No Erythema or Congestion. Size/Enlargement- Bilateral- No enlargement. Discharge- bilateral-None.  Neck Neck-  Supple. No Masses.   Chest and Lung Exam Auscultation: Breath Sounds:-Clear even and unlabored.  Cardiovascular Auscultation:Rythm- Regular, rate and rhythm. Murmurs & Other Heart Sounds:Ausculatation of the heart reveal- No Murmurs.  Lymphatic Head & Neck General Head & Neck Lymphatics: Bilateral: Description- No Localized lymphadenopathy.  Neuro- CN III- XII grossly intact.  Back- no cva tenderness  Abdomen- faint mid suprapubic tenderness. No otherwise nontender. No flank pain.     Assessment & Plan:  Your appear to have  a sinus infection. I am prescribing  augmentin antibiotic for the infection. To help with the nasal congestion use flonase. For your associated cough, I prescribed cough medicine benzonatate.  For possible early uti. We did ua and will get culture. Augmentin has some coverage for uti and will follow sensitivity  Some mild amount of blood in urine. Want to repeat urine on follow up.  Rest, hydrate, tylenol for fever. Want to repeat urine on follow up.   Follow up in 7 days or as needed.  Mackie Pai, PA-C

## 2018-02-10 ENCOUNTER — Encounter: Payer: Self-pay | Admitting: Medical

## 2018-02-10 LAB — URINE CULTURE
MICRO NUMBER:: 91367380
Result:: NO GROWTH
SPECIMEN QUALITY:: ADEQUATE

## 2018-02-16 ENCOUNTER — Encounter: Payer: Self-pay | Admitting: Medical

## 2018-02-16 ENCOUNTER — Telehealth: Payer: Self-pay | Admitting: Medical

## 2018-02-16 ENCOUNTER — Ambulatory Visit (INDEPENDENT_AMBULATORY_CARE_PROVIDER_SITE_OTHER): Payer: Medicare Other | Admitting: Medical

## 2018-02-16 VITALS — BP 108/67 | HR 81 | Temp 98.0°F | Resp 16 | Ht 66.5 in | Wt 180.0 lb

## 2018-02-16 DIAGNOSIS — R319 Hematuria, unspecified: Secondary | ICD-10-CM | POA: Diagnosis not present

## 2018-02-16 DIAGNOSIS — J0100 Acute maxillary sinusitis, unspecified: Secondary | ICD-10-CM

## 2018-02-16 LAB — POC URINALSYSI DIPSTICK (AUTOMATED)
Bilirubin, UA: NEGATIVE
Glucose, UA: NEGATIVE
Ketones, UA: NEGATIVE
Leukocytes, UA: NEGATIVE
Nitrite, UA: NEGATIVE
Protein, UA: NEGATIVE
Spec Grav, UA: >=1.03 — AB (ref 1.010–1.025)
Urobilinogen, UA: NEGATIVE E.U./dL — AB
pH, UA: 6 (ref 5.0–8.0)

## 2018-02-16 NOTE — Progress Notes (Signed)
Subjective:    Patient ID: Bonnie Brady, female    DOB: 09-15-1950, 67 y.o.   MRN: [id number]  HPI  Pt states she is feeling better today. Pt has been on augmentin for 7 days. She states overall a lot better. She still feels some mild pressure. Sunday states sinus pain was still present. No colored mucus. Some pnd present. Describes since Monday feels significant progress.  Pt urine culture came back negative. She did have some trace blood in her urine. Pt state kidney region pain resolved on rt side.   Review of Systems  Constitutional: Negative for chills, fatigue and fever.  HENT: Positive for sinus pressure. Negative for congestion and sinus pain.        Faint pressure still but overall a lot better.  Respiratory: Negative for chest tightness, shortness of breath and wheezing.   Cardiovascular: Negative for chest pain and palpitations.  Gastrointestinal: Negative for abdominal pain.  Musculoskeletal: Negative for back pain.  Skin: Negative for rash.  Neurological: Negative for dizziness and light-headedness.  Hematological: Negative for adenopathy. Does not bruise/bleed easily.  Psychiatric/Behavioral: Negative for behavioral problems, confusion, sleep disturbance and suicidal ideas. The patient is not nervous/anxious.    Past Medical History:  Diagnosis Date  . Hyperlipidemia   . Kidney stone   . Tachycardia   . Varicose veins      Social History   Socioeconomic History  . Marital status: Married    Spouse name: Not on file  . Number of children: Not on file  . Years of education: Not on file  . Highest education level: Not on file  Occupational History  . Not on file  Social Needs  . Financial resource strain: Not on file  . Food insecurity:    Worry: Not on file    Inability: Not on file  . Transportation needs:    Medical: Not on file    Non-medical: Not on file  Tobacco Use  . Smoking status: Former Smoker    Types: Cigarettes    Last attempt to  quit: 03/30/2008    Years since quitting: 9.8  . Smokeless tobacco: Never Used  Substance and Sexual Activity  . Alcohol use: Yes    Comment: occasional < 1 a week  . Drug use: No  . Sexual activity: Not on file  Lifestyle  . Physical activity:    Days per week: Not on file    Minutes per session: Not on file  . Stress: Not on file  Relationships  . Social connections:    Talks on phone: Not on file    Gets together: Not on file    Attends religious service: Not on file    Active member of club or organization: Not on file    Attends meetings of clubs or organizations: Not on file    Relationship status: Not on file  . Intimate partner violence:    Fear of current or ex partner: Not on file    Emotionally abused: Not on file    Physically abused: Not on file    Forced sexual activity: Not on file  Other Topics Concern  . Not on file  Social History Narrative   Has worked as Engineering geologist- stopped working 2011.    Married   2 children   Enjoys Airline pilot       Past Surgical History:  Procedure Laterality Date  . ABDOMINAL HYSTERECTOMY    . APPENDECTOMY  1971  . bladder strap  2006  . [place]  . laser of veins  2010   also in 2016 and 2017  . TONSILLECTOMY  1958    Family History  Problem Relation Age of Onset  . Breast cancer Paternal Grandmother   . Stroke Mother   . Stroke Maternal Grandfather   . COPD Father   . COPD Brother   . Breast cancer Daughter     Allergies  Allergen Reactions  . Ampicillin Hives    Questionable. Had a rash  . Latex Other (See Comments)    Contact with powder.  . Tetanus Toxoids Other (See Comments)    Swelling / redness  . Nickel Rash    Blisters    Current Outpatient Medications on File Prior to Visit  Medication Sig Dispense Refill  . amoxicillin-clavulanate (AUGMENTIN) 875-125 MG tablet Take 1 tablet by mouth 2 (two) times daily. 20 tablet 0  . benzonatate  (TESSALON) 100 MG capsule Take 1 capsule (100 mg total) by mouth 3 (three) times daily as needed for cough. 30 capsule 0  . NON FORMULARY Take by mouth daily. Juice Plus Supplement Whole Foods [total 6 daily]    . NON FORMULARY 1 capsule 2 (two) times daily. Juice Plus Omega Supplement Whole Foods    . OVER THE COUNTER MEDICATION ALGEACAL 360mg .  Take 2 capsule by mouth twice a day.    Marland Kitchen PRESCRIPTION MEDICATION Apply 0.2 mg topically daily. BI-EST (6.4) w/ progest 0.2 + 9% Cream    . rosuvastatin (CRESTOR) 10 MG tablet TAKE 1 TABLET BY MOUTH EVERY DAY 90 tablet 1   No current facility-administered medications on file prior to visit.     BP 108/67   Pulse 81   Temp 98 F (36.7 C) (Oral)   Resp 16   Ht 5' 6.5" (1.689 m)   Wt 180 lb (81.6 kg)   SpO2 94%   BMI 28.62 kg/m       Objective:   Physical Exam  General  Mental Status - Alert. General Appearance - Well groomed. Not in acute distress.  Skin Rashes- No Rashes.  HEENT Head- Normal. Ear Auditory Canal - Left- Normal. Right - Normal.Tympanic Membrane- Left- Normal. Right- Normal. Eye Sclera/Conjunctiva- Left- Normal. Right- Normal. Nose & Sinuses Nasal Mucosa- Left-  Boggy and Congested. Right-  Boggy and  Congested.Faint left sidel maxillary but no  frontal sinus pressure. Mouth & Throat Lips: Upper Lip- Normal: no dryness, cracking, pallor, cyanosis, or vesicular eruption. Lower Lip-Normal: no dryness, cracking, pallor, cyanosis or vesicular eruption. Buccal Mucosa- Bilateral- No Aphthous ulcers. Oropharynx- No Discharge or Erythema. Tonsils: Characteristics- Bilateral- No Erythema or Congestion. Size/Enlargement- Bilateral- No enlargement. Discharge- bilateral-None.  Neck Neck- Supple. No Masses.   Chest and Lung Exam Auscultation: Breath Sounds:-Clear even and unlabored.  Cardiovascular Auscultation:Rythm- Regular, rate and rhythm. Murmurs & Other Heart Sounds:Ausculatation of the heart reveal- No  Murmurs.  Lymphatic Head & Neck General Head & Neck Lymphatics: Bilateral: Description- No Localized lymphadenopathy.        Assessment & Plan:  You do appear to be much better with sinus infection now compared to last visit. I think you will do well with just 3 more days of the augmentin. If pain still persists by early next week could extend course of antibiotic for 3-4 days.  For trace blood in urine on last visit will repeat urine today. Will let you know those result later. If you can't  give sample today then will ask MA to put in future order so you can come back at your convenience.  Follow up as regularly scheduled with pcp or as needed

## 2018-02-16 NOTE — Patient Instructions (Addendum)
You do appear to be much better with sinus infection now compared to last visit. I think you will do well with just 3 more days of the augmentin. If pain still persists by early next week could extend course of antibiotic for 3-4 days.  For trace blood in urine on last visit will repeat urine today. Will let you know those result later. If you can't give sample today then will ask MA to put in future order so you can come back at your convenience.  Follow up as regularly scheduled with pcp or as needed  At your request and hx of sinusitis 3-4 times a year will refer to ENT.

## 2018-02-16 NOTE — Telephone Encounter (Signed)
Referred to urologsit

## 2018-04-07 DIAGNOSIS — Z124 Encounter for screening for malignant neoplasm of cervix: Secondary | ICD-10-CM | POA: Diagnosis not present

## 2018-05-06 DIAGNOSIS — J301 Allergic rhinitis due to pollen: Secondary | ICD-10-CM | POA: Diagnosis not present

## 2018-05-06 DIAGNOSIS — J320 Chronic maxillary sinusitis: Secondary | ICD-10-CM | POA: Diagnosis not present

## 2018-05-16 DIAGNOSIS — R3121 Asymptomatic microscopic hematuria: Secondary | ICD-10-CM | POA: Diagnosis not present

## 2018-05-20 DIAGNOSIS — J320 Chronic maxillary sinusitis: Secondary | ICD-10-CM | POA: Diagnosis not present

## 2018-05-24 DIAGNOSIS — K5730 Diverticulosis of large intestine without perforation or abscess without bleeding: Secondary | ICD-10-CM | POA: Diagnosis not present

## 2018-05-24 DIAGNOSIS — R3121 Asymptomatic microscopic hematuria: Secondary | ICD-10-CM | POA: Diagnosis not present

## 2018-05-31 DIAGNOSIS — J301 Allergic rhinitis due to pollen: Secondary | ICD-10-CM | POA: Diagnosis not present

## 2018-06-02 ENCOUNTER — Other Ambulatory Visit: Payer: Self-pay | Admitting: Family

## 2018-06-02 DIAGNOSIS — H2513 Age-related nuclear cataract, bilateral: Secondary | ICD-10-CM | POA: Diagnosis not present

## 2018-07-14 DIAGNOSIS — R3121 Asymptomatic microscopic hematuria: Secondary | ICD-10-CM | POA: Diagnosis not present

## 2018-07-14 DIAGNOSIS — N905 Atrophy of vulva: Secondary | ICD-10-CM | POA: Diagnosis not present

## 2018-10-27 DIAGNOSIS — R3982 Chronic bladder pain: Secondary | ICD-10-CM | POA: Diagnosis not present

## 2018-10-27 DIAGNOSIS — N905 Atrophy of vulva: Secondary | ICD-10-CM | POA: Diagnosis not present

## 2018-10-27 DIAGNOSIS — R109 Unspecified abdominal pain: Secondary | ICD-10-CM | POA: Diagnosis not present

## 2018-10-29 DIAGNOSIS — U071 COVID-19: Secondary | ICD-10-CM

## 2018-10-29 HISTORY — DX: COVID-19: U07.1

## 2018-11-16 ENCOUNTER — Ambulatory Visit (INDEPENDENT_AMBULATORY_CARE_PROVIDER_SITE_OTHER): Payer: Medicare Other | Admitting: Family Medicine

## 2018-11-16 ENCOUNTER — Other Ambulatory Visit: Payer: Self-pay

## 2018-11-16 ENCOUNTER — Encounter: Payer: Self-pay | Admitting: Family Medicine

## 2018-11-16 VITALS — Temp 99.4°F

## 2018-11-16 DIAGNOSIS — J069 Acute upper respiratory infection, unspecified: Secondary | ICD-10-CM | POA: Diagnosis not present

## 2018-11-16 DIAGNOSIS — K529 Noninfective gastroenteritis and colitis, unspecified: Secondary | ICD-10-CM

## 2018-11-16 MED ORDER — BENZONATATE 100 MG PO CAPS: 100.0000 mg | ORAL_CAPSULE | Freq: Three times a day (TID) | ORAL | 0 refills | Status: DC | PRN

## 2018-11-16 MED ORDER — ONDANSETRON 4 MG PO TBDP: 4.0000 mg | ORAL_TABLET | Freq: Three times a day (TID) | ORAL | 0 refills | Status: DC | PRN

## 2018-11-16 NOTE — Progress Notes (Signed)
Chief Complaint  Patient presents with  . Sore Throat    symptoms for 3 days  . Fever  . Generalized Body Aches  . Cough    Bonnie Brady here for URI complaints. Due to COVID-19 pandemic, we are interacting via web portal for an electronic face-to-face visit. I verified patient's ID using 2 identifiers. Patient agreed to proceed with visit via this method. Patient is at home, I am at office. Patient, son in law, and I are present for visit.   Duration: 2 days  Associated symptoms: fever (100.43F max), rhinorrhea, myalgia and cough, nausea, diarrhea Denies: sinus congestion, itchy watery eyes, ear pain, ear drainage, sore throat, wheezing, shortness of breath and vomiting Treatment to date: Tylenol, trying to push fluids Sick contacts: Yes- just got back from [place] where several fam members had tested positive for COVID  ROS:  Const: Denies fevers HEENT: As noted in HPI Lungs: No SOB  Past Medical History:  Diagnosis Date  . Hyperlipidemia   . Kidney stone   . Tachycardia   . Varicose veins     Temp 99.4 F (37.4 C) (Oral)  No conversational dyspnea Age appropriate judgment and insight Nml affect and mood  Gastroenteritis - Plan: ondansetron (ZOFRAN-ODT) 4 MG disintegrating tablet  Viral URI - Plan: benzonatate (TESSALON) 100 MG capsule  Will screen for covid-19, but even if neg, w s/s's and exposure, will assume she has unless proven otherwise.  Continue to push fluids, practice good hand hygiene, cover mouth when coughing. Warning signs and symptoms verbalized. F/u prn. Pt voiced understanding and agreement to the plan.  [place], DO 11/16/18 3:49 PM

## 2018-11-16 NOTE — Addendum Note (Signed)
Addended by: Sharon Seller B on: 11/16/2018 03:54 PM   Modules accepted: Orders

## 2018-11-17 ENCOUNTER — Other Ambulatory Visit: Payer: Self-pay

## 2018-11-17 DIAGNOSIS — Z20822 Contact with and (suspected) exposure to covid-19: Secondary | ICD-10-CM

## 2018-11-17 DIAGNOSIS — R6889 Other general symptoms and signs: Secondary | ICD-10-CM | POA: Diagnosis not present

## 2018-11-18 ENCOUNTER — Telehealth: Payer: Self-pay | Admitting: Family

## 2018-11-18 ENCOUNTER — Telehealth: Payer: Self-pay | Admitting: *Deleted

## 2018-11-18 LAB — NOVEL CORONAVIRUS, NAA: SARS-CoV-2, NAA: DETECTED — AB

## 2018-11-18 NOTE — Telephone Encounter (Signed)
Spoke with patient- advised her of positive COVID 19 test results.  Patient states she has low grade fever 990.-100, cough, and fatigue.  Symptom tier, home care and self isolation recommendations per CDC reviewed.

## 2018-11-18 NOTE — Telephone Encounter (Signed)
Spoke to patient and her son-in-law who is with her.  Pt is aware of her covid-19 + results. She reports that she is staying hydrated.  She denies chest pain. Son-in-law states her oxygen is stable around 92%.  Advised pt/son-in-law to take her to the ED if she develops sat <90%, increased weakness, shortness of breath. They verbalize understanding.

## 2018-11-20 ENCOUNTER — Inpatient Hospital Stay (HOSPITAL_COMMUNITY)
Admission: EM | Admit: 2018-11-20 | Discharge: 2018-11-26 | DRG: 177 | Disposition: A | Discharge status: Home / Self Care | Payer: Medicare Other | Attending: Internal Medicine | Admitting: Internal Medicine

## 2018-11-20 ENCOUNTER — Emergency Department (HOSPITAL_COMMUNITY): Payer: Medicare Other

## 2018-11-20 ENCOUNTER — Other Ambulatory Visit: Payer: Self-pay

## 2018-11-20 ENCOUNTER — Encounter (HOSPITAL_COMMUNITY): Payer: Self-pay | Admitting: *Deleted

## 2018-11-20 DIAGNOSIS — Z91040 Latex allergy status: Secondary | ICD-10-CM

## 2018-11-20 DIAGNOSIS — J069 Acute upper respiratory infection, unspecified: Secondary | ICD-10-CM | POA: Diagnosis not present

## 2018-11-20 DIAGNOSIS — Z6826 Body mass index (BMI) 26.0-26.9, adult: Secondary | ICD-10-CM

## 2018-11-20 DIAGNOSIS — Z87891 Personal history of nicotine dependence: Secondary | ICD-10-CM

## 2018-11-20 DIAGNOSIS — M810 Age-related osteoporosis without current pathological fracture: Secondary | ICD-10-CM | POA: Diagnosis not present

## 2018-11-20 DIAGNOSIS — E785 Hyperlipidemia, unspecified: Secondary | ICD-10-CM | POA: Diagnosis present

## 2018-11-20 DIAGNOSIS — Z91048 Other nonmedicinal substance allergy status: Secondary | ICD-10-CM

## 2018-11-20 DIAGNOSIS — Z881 Allergy status to other antibiotic agents status: Secondary | ICD-10-CM

## 2018-11-20 DIAGNOSIS — R0600 Dyspnea, unspecified: Secondary | ICD-10-CM

## 2018-11-20 DIAGNOSIS — J1289 Other viral pneumonia: Secondary | ICD-10-CM | POA: Diagnosis not present

## 2018-11-20 DIAGNOSIS — U071 COVID-19: Principal | ICD-10-CM | POA: Diagnosis present

## 2018-11-20 DIAGNOSIS — E43 Unspecified severe protein-calorie malnutrition: Secondary | ICD-10-CM | POA: Diagnosis present

## 2018-11-20 DIAGNOSIS — Z887 Allergy status to serum and vaccine status: Secondary | ICD-10-CM

## 2018-11-20 DIAGNOSIS — Z87442 Personal history of urinary calculi: Secondary | ICD-10-CM

## 2018-11-20 DIAGNOSIS — J9601 Acute respiratory failure with hypoxia: Secondary | ICD-10-CM | POA: Diagnosis present

## 2018-11-20 DIAGNOSIS — R0602 Shortness of breath: Secondary | ICD-10-CM | POA: Diagnosis not present

## 2018-11-20 DIAGNOSIS — E876 Hypokalemia: Secondary | ICD-10-CM | POA: Diagnosis present

## 2018-11-20 DIAGNOSIS — R0902 Hypoxemia: Secondary | ICD-10-CM | POA: Diagnosis not present

## 2018-11-20 DIAGNOSIS — Z79899 Other long term (current) drug therapy: Secondary | ICD-10-CM

## 2018-11-20 DIAGNOSIS — Z825 Family history of asthma and other chronic lower respiratory diseases: Secondary | ICD-10-CM

## 2018-11-20 MED ORDER — SODIUM CHLORIDE 0.9% FLUSH
3.0000 mL | Freq: Once | INTRAVENOUS | Status: AC
  Administered 2018-11-20: 3 mL via INTRAVENOUS

## 2018-11-20 NOTE — ED Triage Notes (Addendum)
Pt was tested on Monday for Covid, resulted + on Wednesday; has been having dry cough, generalized weakness, and sob since last monday. RA sats 86%. Recently in [place] for funeral where multiple attendees were also +Covid

## 2018-11-21 ENCOUNTER — Encounter (HOSPITAL_COMMUNITY): Payer: Self-pay | Admitting: Internal Medicine

## 2018-11-21 DIAGNOSIS — Z887 Allergy status to serum and vaccine status: Secondary | ICD-10-CM | POA: Diagnosis not present

## 2018-11-21 DIAGNOSIS — E43 Unspecified severe protein-calorie malnutrition: Secondary | ICD-10-CM | POA: Diagnosis present

## 2018-11-21 DIAGNOSIS — J069 Acute upper respiratory infection, unspecified: Secondary | ICD-10-CM | POA: Diagnosis not present

## 2018-11-21 DIAGNOSIS — Z87442 Personal history of urinary calculi: Secondary | ICD-10-CM | POA: Diagnosis not present

## 2018-11-21 DIAGNOSIS — Z6826 Body mass index (BMI) 26.0-26.9, adult: Secondary | ICD-10-CM | POA: Diagnosis not present

## 2018-11-21 DIAGNOSIS — J1289 Other viral pneumonia: Secondary | ICD-10-CM | POA: Diagnosis not present

## 2018-11-21 DIAGNOSIS — R0902 Hypoxemia: Secondary | ICD-10-CM | POA: Diagnosis not present

## 2018-11-21 DIAGNOSIS — Z79899 Other long term (current) drug therapy: Secondary | ICD-10-CM | POA: Diagnosis not present

## 2018-11-21 DIAGNOSIS — J9601 Acute respiratory failure with hypoxia: Secondary | ICD-10-CM | POA: Diagnosis not present

## 2018-11-21 DIAGNOSIS — U071 COVID-19: Secondary | ICD-10-CM | POA: Diagnosis not present

## 2018-11-21 DIAGNOSIS — M810 Age-related osteoporosis without current pathological fracture: Secondary | ICD-10-CM | POA: Diagnosis present

## 2018-11-21 DIAGNOSIS — Z87891 Personal history of nicotine dependence: Secondary | ICD-10-CM | POA: Diagnosis not present

## 2018-11-21 DIAGNOSIS — Z91040 Latex allergy status: Secondary | ICD-10-CM | POA: Diagnosis not present

## 2018-11-21 DIAGNOSIS — E876 Hypokalemia: Secondary | ICD-10-CM | POA: Diagnosis present

## 2018-11-21 DIAGNOSIS — E785 Hyperlipidemia, unspecified: Secondary | ICD-10-CM | POA: Diagnosis present

## 2018-11-21 DIAGNOSIS — Z91048 Other nonmedicinal substance allergy status: Secondary | ICD-10-CM | POA: Diagnosis not present

## 2018-11-21 DIAGNOSIS — Z825 Family history of asthma and other chronic lower respiratory diseases: Secondary | ICD-10-CM | POA: Diagnosis not present

## 2018-11-21 DIAGNOSIS — Z881 Allergy status to other antibiotic agents status: Secondary | ICD-10-CM | POA: Diagnosis not present

## 2018-11-21 LAB — URINALYSIS, ROUTINE W REFLEX MICROSCOPIC
Bilirubin Urine: NEGATIVE
Glucose, UA: NEGATIVE mg/dL
Hgb urine dipstick: NEGATIVE
Ketones, ur: 20 mg/dL — AB
Leukocytes,Ua: NEGATIVE
Nitrite: NEGATIVE
Protein, ur: 100 mg/dL — AB
Specific Gravity, Urine: 1.015 (ref 1.005–1.030)
pH: 6 (ref 5.0–8.0)

## 2018-11-21 LAB — CBC WITH DIFFERENTIAL/PLATELET
Abs Immature Granulocytes: 0.05 10*3/uL (ref 0.00–0.07)
Abs Immature Granulocytes: 0 10*3/uL (ref 0.00–0.07)
Basophils Absolute: 0 10*3/uL (ref 0.0–0.1)
Basophils Absolute: 0 10*3/uL (ref 0.0–0.1)
Basophils Relative: 0 %
Basophils Relative: 0 %
Eosinophils Absolute: 0 10*3/uL (ref 0.0–0.5)
Eosinophils Absolute: 0 10*3/uL (ref 0.0–0.5)
Eosinophils Relative: 0 %
Eosinophils Relative: 0 %
HCT: 44.5 % (ref 36.0–46.0)
HCT: 44.3 % (ref 36.0–46.0)
Hemoglobin: 15 g/dL (ref 12.0–15.0)
Hemoglobin: 14.5 g/dL (ref 12.0–15.0)
Immature Granulocytes: 1 %
Lymphocytes Relative: 11 %
Lymphocytes Relative: 5 %
Lymphs Abs: 0.9 10*3/uL (ref 0.7–4.0)
Lymphs Abs: 0.3 10*3/uL — ABNORMAL LOW (ref 0.7–4.0)
MCH: 31.1 pg (ref 26.0–34.0)
MCH: 30.8 pg (ref 26.0–34.0)
MCHC: 33.7 g/dL (ref 30.0–36.0)
MCHC: 32.7 g/dL (ref 30.0–36.0)
MCV: 92.1 fL (ref 80.0–100.0)
MCV: 94.1 fL (ref 80.0–100.0)
Monocytes Absolute: 0.3 10*3/uL (ref 0.1–1.0)
Monocytes Absolute: 0.2 10*3/uL (ref 0.1–1.0)
Monocytes Relative: 4 %
Monocytes Relative: 3 %
Neutro Abs: 6.8 10*3/uL (ref 1.7–7.7)
Neutro Abs: 5.3 10*3/uL (ref 1.7–7.7)
Neutrophils Relative %: 84 %
Neutrophils Relative %: 92 %
Platelets: 196 10*3/uL (ref 150–400)
Platelets: 187 10*3/uL (ref 150–400)
RBC: 4.83 MIL/uL (ref 3.87–5.11)
RBC: 4.71 MIL/uL (ref 3.87–5.11)
RDW: 13.6 % (ref 11.5–15.5)
RDW: 13.5 % (ref 11.5–15.5)
WBC: 8.2 10*3/uL (ref 4.0–10.5)
WBC: 5.8 10*3/uL (ref 4.0–10.5)
nRBC: 0 % (ref 0.0–0.2)
nRBC: 0 % (ref 0.0–0.2)
nRBC: 0 /100 WBC

## 2018-11-21 LAB — COMPREHENSIVE METABOLIC PANEL
ALT: 21 U/L (ref 0–44)
ALT: 22 U/L (ref 0–44)
AST: 38 U/L (ref 15–41)
AST: 35 U/L (ref 15–41)
Albumin: 2.7 g/dL — ABNORMAL LOW (ref 3.5–5.0)
Albumin: 2.5 g/dL — ABNORMAL LOW (ref 3.5–5.0)
Alkaline Phosphatase: 44 U/L (ref 38–126)
Alkaline Phosphatase: 43 U/L (ref 38–126)
Anion gap: 13 (ref 5–15)
Anion gap: 11 (ref 5–15)
BUN: 14 mg/dL (ref 8–23)
BUN: 11 mg/dL (ref 8–23)
CO2: 21 mmol/L — ABNORMAL LOW (ref 22–32)
CO2: 23 mmol/L (ref 22–32)
Calcium: 8.5 mg/dL — ABNORMAL LOW (ref 8.9–10.3)
Calcium: 8.4 mg/dL — ABNORMAL LOW (ref 8.9–10.3)
Chloride: 101 mmol/L (ref 98–111)
Chloride: 103 mmol/L (ref 98–111)
Creatinine, Ser: 0.88 mg/dL (ref 0.44–1.00)
Creatinine, Ser: 0.98 mg/dL (ref 0.44–1.00)
GFR calc Af Amer: >60 mL/min (ref >60)
GFR calc Af Amer: >60 mL/min (ref >60)
GFR calc non Af Amer: >60 mL/min (ref >60)
GFR calc non Af Amer: 60 mL/min — ABNORMAL LOW (ref >60)
Glucose, Bld: 116 mg/dL — ABNORMAL HIGH (ref 70–99)
Glucose, Bld: 143 mg/dL — ABNORMAL HIGH (ref 70–99)
Potassium: 3.4 mmol/L — ABNORMAL LOW (ref 3.5–5.1)
Potassium: 3.6 mmol/L (ref 3.5–5.1)
Sodium: 135 mmol/L (ref 135–145)
Sodium: 137 mmol/L (ref 135–145)
Total Bilirubin: 0.8 mg/dL (ref 0.3–1.2)
Total Bilirubin: 0.8 mg/dL (ref 0.3–1.2)
Total Protein: 5.8 g/dL — ABNORMAL LOW (ref 6.5–8.1)
Total Protein: 5.7 g/dL — ABNORMAL LOW (ref 6.5–8.1)

## 2018-11-21 LAB — HIV ANTIBODY (ROUTINE TESTING W REFLEX): HIV Screen 4th Generation wRfx: NONREACTIVE

## 2018-11-21 LAB — LACTATE DEHYDROGENASE: LDH: 444 U/L — ABNORMAL HIGH (ref 98–192)

## 2018-11-21 LAB — FERRITIN: Ferritin: 1364 ng/mL — ABNORMAL HIGH (ref 11–307)

## 2018-11-21 LAB — C-REACTIVE PROTEIN: CRP: 15.4 mg/dL — ABNORMAL HIGH (ref <1.0)

## 2018-11-21 LAB — LACTIC ACID, PLASMA: Lactic Acid, Venous: 1.5 mmol/L (ref 0.5–1.9)

## 2018-11-21 LAB — PROCALCITONIN: Procalcitonin: <0.1 ng/mL

## 2018-11-21 LAB — ABO/RH: ABO/RH(D): B POS

## 2018-11-21 MED ORDER — ACETAMINOPHEN 650 MG RE SUPP: 650.0000 mg | Freq: Four times a day (QID) | RECTAL | Status: DC | PRN

## 2018-11-21 MED ORDER — SODIUM CHLORIDE 0.9 % IV SOLN
100.0000 mg | INTRAVENOUS | Status: AC
  Administered 2018-11-22 – 2018-11-25 (×4): 100 mg via INTRAVENOUS
  Filled 2018-11-21 (×4): qty 20

## 2018-11-21 MED ORDER — METHYLPREDNISOLONE SODIUM SUCC 40 MG IJ SOLR
40.0000 mg | Freq: Two times a day (BID) | INTRAMUSCULAR | Status: DC
  Administered 2018-11-21: 40 mg via INTRAVENOUS
  Filled 2018-11-21: qty 1

## 2018-11-21 MED ORDER — ROSUVASTATIN CALCIUM 5 MG PO TABS
10.0000 mg | ORAL_TABLET | Freq: Every day | ORAL | Status: DC
  Administered 2018-11-21 – 2018-11-25 (×5): 10 mg via ORAL
  Filled 2018-11-21 (×8): qty 2

## 2018-11-21 MED ORDER — ONDANSETRON HCL 4 MG/2ML IJ SOLN: 4.0000 mg | Freq: Four times a day (QID) | INTRAMUSCULAR | Status: DC | PRN

## 2018-11-21 MED ORDER — ALBUTEROL SULFATE HFA 108 (90 BASE) MCG/ACT IN AERS
2.0000 | INHALATION_SPRAY | Freq: Four times a day (QID) | RESPIRATORY_TRACT | Status: DC | PRN
  Filled 2018-11-21: qty 6.7

## 2018-11-21 MED ORDER — ACETAMINOPHEN 325 MG PO TABS
650.0000 mg | ORAL_TABLET | Freq: Four times a day (QID) | ORAL | Status: DC | PRN
  Filled 2018-11-21: qty 2

## 2018-11-21 MED ORDER — POTASSIUM CHLORIDE CRYS ER 20 MEQ PO TBCR
40.0000 meq | EXTENDED_RELEASE_TABLET | Freq: Once | ORAL | Status: AC
  Administered 2018-11-21: 22:00:00 40 meq via ORAL
  Filled 2018-11-21: qty 2

## 2018-11-21 MED ORDER — SODIUM CHLORIDE 0.9 % IV SOLN
200.0000 mg | Freq: Once | INTRAVENOUS | Status: AC
  Administered 2018-11-21: 200 mg via INTRAVENOUS
  Filled 2018-11-21: qty 40

## 2018-11-21 MED ORDER — ENOXAPARIN SODIUM 40 MG/0.4ML ~~LOC~~ SOLN
40.0000 mg | SUBCUTANEOUS | Status: DC
  Filled 2018-11-21 (×2): qty 0.4

## 2018-11-21 MED ORDER — TOCILIZUMAB 400 MG/20ML IV SOLN
8.0000 mg/kg | Freq: Once | INTRAVENOUS | Status: AC
  Administered 2018-11-21: 640 mg via INTRAVENOUS
  Filled 2018-11-21: qty 32

## 2018-11-21 MED ORDER — POTASSIUM CHLORIDE CRYS ER 20 MEQ PO TBCR
20.0000 meq | EXTENDED_RELEASE_TABLET | Freq: Once | ORAL | Status: AC
  Administered 2018-11-21: 09:00:00 20 meq via ORAL
  Filled 2018-11-21: qty 1

## 2018-11-21 MED ORDER — ZINC SULFATE 220 (50 ZN) MG PO CAPS
220.0000 mg | ORAL_CAPSULE | Freq: Every day | ORAL | Status: DC
  Administered 2018-11-21 – 2018-11-26 (×6): 220 mg via ORAL
  Filled 2018-11-21 (×8): qty 1

## 2018-11-21 MED ORDER — ONDANSETRON HCL 4 MG PO TABS: 4.0000 mg | ORAL_TABLET | Freq: Four times a day (QID) | ORAL | Status: DC | PRN

## 2018-11-21 MED ORDER — METOPROLOL TARTRATE 25 MG PO TABS
25.0000 mg | ORAL_TABLET | Freq: Two times a day (BID) | ORAL | Status: DC
  Administered 2018-11-21 – 2018-11-22 (×2): 25 mg via ORAL
  Filled 2018-11-21 (×3): qty 1

## 2018-11-21 MED ORDER — METHYLPREDNISOLONE SODIUM SUCC 125 MG IJ SOLR
60.0000 mg | Freq: Two times a day (BID) | INTRAMUSCULAR | Status: DC
  Administered 2018-11-21: 13:00:00 60 mg via INTRAVENOUS
  Administered 2018-11-22: 62.5 mg via INTRAVENOUS
  Administered 2018-11-22 – 2018-11-24 (×4): 60 mg via INTRAVENOUS
  Filled 2018-11-21 (×7): qty 2

## 2018-11-21 MED ORDER — MAGNESIUM SULFATE IN D5W 1-5 GM/100ML-% IV SOLN
1.0000 g | Freq: Once | INTRAVENOUS | Status: AC
  Administered 2018-11-21: 1 g via INTRAVENOUS
  Filled 2018-11-21 (×2): qty 100

## 2018-11-21 MED ORDER — VITAMIN C 500 MG PO TABS
500.0000 mg | ORAL_TABLET | Freq: Every day | ORAL | Status: DC
  Administered 2018-11-21 – 2018-11-26 (×6): 500 mg via ORAL
  Filled 2018-11-21 (×8): qty 1

## 2018-11-21 NOTE — Progress Notes (Addendum)
Bonnie Brady, is a 68 y.o. female, DOB - 19-Jun-1950, [id number]  Patient admitted earlier today, still tachypneic and short of breath, requiring 3 L nasal cannula oxygen, Remdisvir has been started, CRP elevated at 15, ferritin high, started on IV steroids and Remdisvir. Actemra off label use - patient was told that if COVID-19 pneumonitis gets worse we might potentially use Actemra off label, she denies any known history of tuberculosis or hepatitis, understands the risks and benefits and wants to proceed with Actemra treatment if required.   Vitals:   11/21/18 0200 11/21/18 0230 11/21/18 0400 11/21/18 0621  BP: 113/73 114/68 108/71 117/75  Pulse: 77 73 73 73  Resp: (!) 23 (!) 26 (!) 28 (!) 24  Temp:      SpO2: 90% 91% 91% 90%      COVID-19 Labs  Recent Labs    11/21/18 0452  FERRITIN 1,364*  LDH 444*  CRP 15.4*    Lab Results  Component Value Date   SARSCOV2NAA Detected (A) 11/17/2018      Data Review   Micro Results Recent Results (from the past 240 hour(s))  Novel Coronavirus, NAA ([facility])     Status: Abnormal   Collection Time: 11/17/18  1:17 PM   Specimen: Oropharyngeal(OP) collection in vial transport medium   OROPHARYNGEA  TESTING  Result Value Ref Range Status   SARS-CoV-2, NAA Detected (A) Not Detected Final    Comment: This test was developed and its performance characteristics determined by [employer]. This test has not been FDA cleared or approved. This test has been authorized by FDA under an Emergency Use Authorization (EUA). This test is only authorized for the duration of time the declaration that circumstances exist justifying the authorization of the emergency use of in vitro diagnostic tests for detection of SARS-CoV-2 virus and/or diagnosis of COVID-19 infection under section 564(b)(1) of the Act, 21 U.S.C. [id number]), unless the authorization  is terminated or revoked sooner. When diagnostic testing is negative, the possibility of a false negative result should be considered in the context of a patient's recent exposures and the presence of clinical signs and symptoms consistent with COVID-19. An individual without symptoms of COVID-19 and who is not shedding SARS-CoV-2 virus would expect to have a negative (not detected) result in this assay.     Radiology Reports Dg Chest Portable 1 View  Result Date: 11/20/2018 CLINICAL DATA:  Shortness of breath EXAM: PORTABLE CHEST 1 VIEW COMPARISON:  None. FINDINGS: Heart is borderline in size. Patchy bibasilar airspace opacities. No effusions or acute bony abnormality. IMPRESSION: Patchy bibasilar atelectasis or infiltrates. Electronically Signed   By: Rolm Baptise M.D.   On: 11/20/2018 23:25    CBC Recent Labs  Lab 11/20/18 2300 11/21/18 0452  WBC 8.2 5.8  HGB 15.0 14.5  HCT 44.5 44.3  PLT 196 187  MCV 92.1 94.1  MCH 31.1 30.8  MCHC 33.7 32.7  RDW 13.6 13.5  LYMPHSABS 0.9 0.3*  MONOABS 0.3 0.2  EOSABS 0.0 0.0  BASOSABS 0.0 0.0    Chemistries  Recent Labs  Lab 11/20/18 2300 11/21/18 0452  NA 135 137  K 3.4* 3.6  CL 101 103  CO2 21* 23  GLUCOSE 116* 143*  BUN 14 11  CREATININE 0.88 0.98  CALCIUM 8.5* 8.4*  AST 38 35  ALT 21 22  ALKPHOS 44 43  BILITOT 0.8 0.8   ------------------------------------------------------------------------------------------------------------------ CrCl cannot be calculated (Unknown ideal weight.). ------------------------------------------------------------------------------------------------------------------ No results for input(s): HGBA1C in the last 72  hours. ------------------------------------------------------------------------------------------------------------------ No results for input(s): CHOL, HDL, LDLCALC, TRIG, CHOLHDL, LDLDIRECT in the last 72 hours.  ------------------------------------------------------------------------------------------------------------------ No results for input(s): TSH, T4TOTAL, T3FREE, THYROIDAB in the last 72 hours.  Invalid input(s): FREET3 ------------------------------------------------------------------------------------------------------------------ Recent Labs    11/21/18 0452  FERRITIN 1,364*    Coagulation profile No results for input(s): INR, PROTIME in the last 168 hours.  No results for input(s): DDIMER in the last 72 hours.  Cardiac Enzymes No results for input(s): CKMB, TROPONINI, MYOGLOBIN in the last 168 hours.  Invalid input(s): CK ------------------------------------------------------------------------------------------------------------------ Invalid input(s): POCBNP

## 2018-11-21 NOTE — ED Provider Notes (Signed)
Emergency Department Provider Note   I have reviewed the triage vital signs and the nursing notes.   HISTORY  Chief Complaint Shortness of Breath (COVID +)   HPI Bonnie Brady is a 68 y.o. female who was tested positive coronavirus on the 20th presents the emergency department with progressively worsening symptoms.  Patient states that last Monday she started having worsening symptoms of chills cough nausea and this progressively worsened until the point where she was very fatigued and tired and significant body aches.  She still tolerating p.o. but has not eaten much last few days either.  No urinary symptoms.  No rashes.  No pain anywhere.  No headache or neck pain.  Patient was around many other people are also diagnosed with coronavirus.  Apparently her walking oxygen saturation at home was 82% and this is why her son-in-law brought her in (he is a Marine scientist).   No other associated or modifying symptoms.    Past Medical History:  Diagnosis Date  . Hyperlipidemia   . Kidney stone   . Tachycardia   . Varicose veins     Patient Active Problem List   Diagnosis Date Noted  . Palpitations 01/21/2016  . Onychomycosis 09/25/2014  . Hyperlipidemia 08/05/2013  . Osteoporosis 12/11/2011  . Routine general medical examination at a health care facility 11/16/2011  . Abnormal EKG 11/16/2011    Past Surgical History:  Procedure Laterality Date  . ABDOMINAL HYSTERECTOMY    . APPENDECTOMY  1971  . bladder strap  2006  . [place]  . laser of veins  2010   also in 2016 and 2017  . TONSILLECTOMY  1958    Current Outpatient Rx  . Order #: [id number] Class: Normal  . Order #: [id number] Class: Historical Med  . Order #: [id number] Class: Historical Med  . Order #: [id number] Class: Normal  . Order #: [id number] Class: Historical Med  . Order #: [id number] Class: Historical Med  . Order #: [id number] Class: Normal    Allergies Ampicillin, Latex, Tetanus toxoids, and  Nickel  Family History  Problem Relation Age of Onset  . Breast cancer Paternal Grandmother   . Stroke Mother   . Stroke Maternal Grandfather   . COPD Father   . COPD Brother   . Breast cancer Daughter     Social History Social History   Tobacco Use  . Smoking status: Former Smoker    Types: Cigarettes    Quit date: 03/30/2008    Years since quitting: 10.6  . Smokeless tobacco: Never Used  Substance Use Topics  . Alcohol use: Yes    Comment: occasional < 1 a week  . Drug use: No    Review of Systems  All other systems negative except as documented in the HPI. All pertinent positives and negatives as reviewed in the HPI. ____________________________________________   PHYSICAL EXAM:  VITAL SIGNS: ED Triage Vitals  Enc Vitals Group     BP 11/20/18 2252 114/71     Pulse Rate 11/20/18 2252 80     Resp 11/20/18 2252 (!) 24     Temp 11/20/18 2252 98.8 F (37.1 C)     Temp src --      SpO2 11/20/18 2252 (!) 86 %    Constitutional: Alert and oriented. Well appearing and in no acute distress. Eyes: Conjunctivae are normal. PERRL. EOMI. Head: Atraumatic. Nose: No congestion/rhinnorhea. Mouth/Throat: Mucous membranes are moist.  Oropharynx non-erythematous. Neck: No stridor.  No meningeal signs.   Cardiovascular: Normal  rate, regular rhythm. Good peripheral circulation. Grossly normal heart sounds.   Respiratory: tachypneic respiratory effort.  No retractions. Lungs CTAB. Gastrointestinal: Soft and nontender. No distention.  Musculoskeletal: No lower extremity tenderness nor edema. No gross deformities of extremities. Neurologic:  Normal speech and language. No gross focal neurologic deficits are appreciated.  Skin:  Skin is warm, dry and intact. No rash noted.  ____________________________________________   LABS (all labs ordered are listed, but only abnormal results are displayed)  Labs Reviewed  COMPREHENSIVE METABOLIC PANEL - Abnormal; Notable for the following  components:      Result Value   Potassium 3.4 (*)    CO2 21 (*)    Glucose, Bld 116 (*)    Calcium 8.5 (*)    Total Protein 5.8 (*)    Albumin 2.7 (*)    All other components within normal limits  NOVEL CORONAVIRUS, NAA (HOSPITAL ORDER, SEND-OUT TO REF LAB)  LACTIC ACID, PLASMA  CBC WITH DIFFERENTIAL/PLATELET  LACTIC ACID, PLASMA  URINALYSIS, ROUTINE W REFLEX MICROSCOPIC   ____________________________________________  EKG   EKG Interpretation  Date/Time:    Ventricular Rate:    PR Interval:    QRS Duration:   QT Interval:    QTC Calculation:   R Axis:     Text Interpretation:         ____________________________________________  RADIOLOGY  Dg Chest Portable 1 View  Result Date: 11/20/2018 CLINICAL DATA:  Shortness of breath EXAM: PORTABLE CHEST 1 VIEW COMPARISON:  None. FINDINGS: Heart is borderline in size. Patchy bibasilar airspace opacities. No effusions or acute bony abnormality. IMPRESSION: Patchy bibasilar atelectasis or infiltrates. Electronically Signed   By: Rolm Baptise M.D.   On: 11/20/2018 23:25    ____________________________________________   PROCEDURES  Procedure(s) performed:   Procedures  CRITICAL CARE Performed by: Merrily Pew Total critical care time: 35 minutes Critical care time was exclusive of separately billable procedures and treating other patients. Critical care was necessary to treat or prevent imminent or life-threatening deterioration. Critical care was time spent personally by me on the following activities: development of treatment plan with patient and/or surrogate as well as nursing, discussions with consultants, evaluation of patient's response to treatment, examination of patient, obtaining history from patient or surrogate, ordering and performing treatments and interventions, ordering and review of laboratory studies, ordering and review of radiographic studies, pulse oximetry and re-evaluation of patient's condition.   ____________________________________________   INITIAL IMPRESSION / ASSESSMENT AND PLAN / ED COURSE  Worsening dyspnea and hypoxia with covid, will get labs and admit.   Discussed with Dr. Hal Hope for admission to [facility]. Requested starting solumedrol 50% of 1 mg/kg.  Pertinent labs & imaging results that were available during my care of the patient were reviewed by me and considered in my medical decision making (see chart for details). ____________________________________________  FINAL CLINICAL IMPRESSION(S) / ED DIAGNOSES  Final diagnoses:  Dyspnea, unspecified type  Acute respiratory disease due to COVID-19 virus  Hypoxia     MEDICATIONS GIVEN DURING THIS VISIT:  Medications  methylPREDNISolone sodium succinate (SOLU-MEDROL) 40 mg/mL injection 40 mg (has no administration in time range)  sodium chloride flush (NS) 0.9 % injection 3 mL (3 mLs Intravenous Given 11/20/18 2351)     NEW OUTPATIENT MEDICATIONS STARTED DURING THIS VISIT:  New Prescriptions   No medications on file    Note:  This note was prepared with assistance of Dragon voice recognition software. Occasional wrong-word or sound-a-like substitutions may have occurred due to the  inherent limitations of voice recognition software.   Mesiah Manzo, Corene Cornea, MD 11/21/18 [phone]

## 2018-11-21 NOTE — ED Notes (Signed)
Pt on and off bedpan, very labored with oxygen sats dropping as low as 83%. Purewick placed

## 2018-11-21 NOTE — H&P (Signed)
History and Physical    Bonnie Brady [id number] DOB: 10-24-1950 DOA: 11/20/2018  PCP: [name], NP  Patient coming from: Home.  Chief Complaint: Shortness of breath and hypoxia.  HPI: Bonnie Brady is a 68 y.o. female with history of hyperlipidemia who had recently traveled to [place] to attend a funeral and came back 4 days ago with complaints of generalized body ache fever chills nausea vomiting and diarrhea was diagnosed with COVID-19.  Subsequent which patient started getting more weaker and patient's family checked her oxygen sats at home was found to be hypoxic and patient getting more short of breath but denies any chest pain.  Given the symptoms patient was brought to the ER.  ED Course: In the ER patient was hypoxic requiring 2 to 3 L oxygen to maintain sats.  Chest x-ray showed bilateral infiltrates.  Labs revealed mild hypokalemia.  Given the symptoms patient is being admitted for further management of acute respiratory failure secondary to COVID pneumonia.  Review of Systems: As per HPI, rest all negative.   Past Medical History:  Diagnosis Date  . Hyperlipidemia   . Kidney stone   . Tachycardia   . Varicose veins     Past Surgical History:  Procedure Laterality Date  . ABDOMINAL HYSTERECTOMY    . APPENDECTOMY  1971  . bladder strap  2006  . [place]  . laser of veins  2010   also in 2016 and 2017  . TONSILLECTOMY  1958     reports that she quit smoking about 10 years ago. Her smoking use included cigarettes. She has never used smokeless tobacco. She reports current alcohol use. She reports that she does not use drugs.  Allergies  Allergen Reactions  . Ampicillin Hives    Questionable. Had a rash  . Latex Other (See Comments)    Contact with powder.  . Tetanus Toxoids Other (See Comments)    Swelling / redness  . Nickel Rash    Blisters    Family History  Problem Relation Age of Onset  . Breast cancer Paternal  Grandmother   . Stroke Mother   . Stroke Maternal Grandfather   . COPD Father   . COPD Brother   . Breast cancer Daughter     Prior to Admission medications   Medication Sig Start Date End Date Taking? Authorizing Provider  benzonatate (TESSALON) 100 MG capsule Take 1 capsule (100 mg total) by mouth 3 (three) times daily as needed. 11/16/18   Shelda Pal, DO  NON FORMULARY Take by mouth daily. Juice Plus Supplement [employer] [total 6 daily]    [provider]  NON FORMULARY 1 capsule 2 (two) times daily. Juice Plus Omega Supplement [employer]    [provider]  ondansetron (ZOFRAN-ODT) 4 MG disintegrating tablet Take 1 tablet (4 mg total) by mouth every 8 (eight) hours as needed for nausea or vomiting. 11/16/18   Nani Ravens, Crosby Oyster, DO  OVER THE COUNTER MEDICATION ALGEACAL 360mg .  Take 2 capsule by mouth twice a day.    [provider]  PRESCRIPTION MEDICATION Apply 0.2 mg topically daily. BI-EST (6.4) w/ progest 0.2 + 9% Cream    [provider]  rosuvastatin (CRESTOR) 10 MG tablet TAKE 1 TABLET BY MOUTH EVERY DAY 06/02/18   [name], NP    Physical Exam: Constitutional: Moderately built and nourished. Vitals:   11/21/18 0000 11/21/18 0100 11/21/18 0200 11/21/18 0230  BP: 114/71 114/70  113/73 114/68  Pulse: 80 74 77 73  Resp: (!) 31 (!) 24 (!) 23 (!) 26  Temp:      SpO2: 91% 90% 90% 91%   Eyes: Anicteric no pallor. ENMT: No discharge from the ears eyes nose and mouth. Neck: No mass felt.  No neck rigidity. Respiratory: No rhonchi or crepitations. Cardiovascular: S1-S2 heard. Abdomen: Soft nontender bowel sounds present. Musculoskeletal: No edema. Skin: No rash. Neurologic: Alert awake oriented to time place and person.  Moves all extremities. Psychiatric: Appears normal per normal affect.   Labs on Admission: I have personally reviewed following labs and imaging studies  CBC: Recent Labs  Lab 11/20/18 2300   WBC 8.2  NEUTROABS 6.8  HGB 15.0  HCT 44.5  MCV 92.1  PLT 123456   Basic Metabolic Panel: Recent Labs  Lab 11/20/18 2300  NA 135  K 3.4*  CL 101  CO2 21*  GLUCOSE 116*  BUN 14  CREATININE 0.88  CALCIUM 8.5*   GFR: CrCl cannot be calculated (Unknown ideal weight.). Liver Function Tests: Recent Labs  Lab 11/20/18 2300  AST 38  ALT 21  ALKPHOS 44  BILITOT 0.8  PROT 5.8*  ALBUMIN 2.7*   No results for input(s): LIPASE, AMYLASE in the last 168 hours. No results for input(s): AMMONIA in the last 168 hours. Coagulation Profile: No results for input(s): INR, PROTIME in the last 168 hours. Cardiac Enzymes: No results for input(s): CKTOTAL, CKMB, CKMBINDEX, TROPONINI in the last 168 hours. BNP (last 3 results) No results for input(s): PROBNP in the last 8760 hours. HbA1C: No results for input(s): HGBA1C in the last 72 hours. CBG: No results for input(s): GLUCAP in the last 168 hours. Lipid Profile: No results for input(s): CHOL, HDL, LDLCALC, TRIG, CHOLHDL, LDLDIRECT in the last 72 hours. Thyroid Function Tests: No results for input(s): TSH, T4TOTAL, FREET4, T3FREE, THYROIDAB in the last 72 hours. Anemia Panel: No results for input(s): VITAMINB12, FOLATE, FERRITIN, TIBC, IRON, RETICCTPCT in the last 72 hours. Urine analysis:    Component Value Date/Time   COLORURINE YELLOW 11/21/2018 0134   APPEARANCEUR HAZY (A) 11/21/2018 0134   LABSPEC 1.015 11/21/2018 0134   PHURINE 6.0 11/21/2018 0134   GLUCOSEU NEGATIVE 11/21/2018 0134   GLUCOSEU NEGATIVE 09/24/2015 1052   HGBUR NEGATIVE 11/21/2018 0134   BILIRUBINUR NEGATIVE 11/21/2018 0134   BILIRUBINUR Negative 02/16/2018 1349   KETONESUR 20 (A) 11/21/2018 0134   PROTEINUR 100 (A) 11/21/2018 0134   UROBILINOGEN negative (A) 02/16/2018 1349   UROBILINOGEN 0.2 09/24/2015 1052   NITRITE NEGATIVE 11/21/2018 0134   LEUKOCYTESUR NEGATIVE 11/21/2018 0134   Sepsis Labs: @LABRCNTIP (procalcitonin:4,lacticidven:4) ) Recent  Results (from the past 240 hour(s))  Novel Coronavirus, NAA ([facility])     Status: Abnormal   Collection Time: 11/17/18  1:17 PM   Specimen: Oropharyngeal(OP) collection in vial transport medium   OROPHARYNGEA  TESTING  Result Value Ref Range Status   SARS-CoV-2, NAA Detected (A) Not Detected Final    Comment: This test was developed and its performance characteristics determined by [employer]. This test has not been FDA cleared or approved. This test has been authorized by FDA under an Emergency Use Authorization (EUA). This test is only authorized for the duration of time the declaration that circumstances exist justifying the authorization of the emergency use of in vitro diagnostic tests for detection of SARS-CoV-2 virus and/or diagnosis of COVID-19 infection under section 564(b)(1) of the Act, 21 U.S.C. [id number]), unless the authorization is terminated or revoked sooner.  When diagnostic testing is negative, the possibility of a false negative result should be considered in the context of a patient's recent exposures and the presence of clinical signs and symptoms consistent with COVID-19. An individual without symptoms of COVID-19 and who is not shedding SARS-CoV-2 virus would expect to have a negative (not detected) result in this assay.      Radiological Exams on Admission: Dg Chest Portable 1 View  Result Date: 11/20/2018 CLINICAL DATA:  Shortness of breath EXAM: PORTABLE CHEST 1 VIEW COMPARISON:  None. FINDINGS: Heart is borderline in size. Patchy bibasilar airspace opacities. No effusions or acute bony abnormality. IMPRESSION: Patchy bibasilar atelectasis or infiltrates. Electronically Signed   By: Rolm Baptise M.D.   On: 11/20/2018 23:25    EKG: Independently reviewed.  Normal sinus rhythm.  Assessment/Plan Principal Problem:   Acute respiratory failure with hypoxia (HCC) Active Problems:   Hyperlipidemia   Acute respiratory disease due to COVID-19 virus     1. Acute respiratory failure with hypoxia secondary to cover pneumonia -patient has been started on Solu-Medrol IV and will consult pharmacy for Remdesivir since patient is hypoxic and has lung infiltrates.  Check CRP ferritin levels procalcitonin levels. 2. Hyperlipidemia on statins. 3. Severe protein calorie malnutrition will need nutrition consult.  Given the patient hypoxic state with COVID-19 pneumonia patient will need close follow-up and inpatient status.   DVT prophylaxis: Lovenox. Code Status: Full code. Family Communication: Patient's son-in-law. Disposition Plan: Home. Consults called: None. Admission status: Inpatient.   Rise Patience MD [facility] Pager [phone].  If 7PM-7AM, please contact night-coverage [email or web address] Password [facility]  11/21/2018, 3:27 AM

## 2018-11-21 NOTE — ED Notes (Signed)
[place] (Lunch Tray Ordered @ 1143-per Roxine Caddy, RN called by Levada Dy

## 2018-11-21 NOTE — ED Notes (Signed)
[facility] called for transfer to [facility])called @ 1044-per RN called by Levada Dy

## 2018-11-21 NOTE — ED Notes (Signed)
Breakfast ordered--Bonnie Brady  

## 2018-11-21 NOTE — Progress Notes (Signed)
Patient arrived via EMS at 1625hrs; oriented to unit and call bell.  Placed on cardiac monitor.

## 2018-11-22 LAB — COMPREHENSIVE METABOLIC PANEL
ALT: 29 U/L (ref 0–44)
AST: 45 U/L — ABNORMAL HIGH (ref 15–41)
Albumin: 2.7 g/dL — ABNORMAL LOW (ref 3.5–5.0)
Alkaline Phosphatase: 45 U/L (ref 38–126)
Anion gap: 10 (ref 5–15)
BUN: 19 mg/dL (ref 8–23)
CO2: 24 mmol/L (ref 22–32)
Calcium: 8.5 mg/dL — ABNORMAL LOW (ref 8.9–10.3)
Chloride: 104 mmol/L (ref 98–111)
Creatinine, Ser: 0.72 mg/dL (ref 0.44–1.00)
GFR calc Af Amer: >60 mL/min (ref >60)
GFR calc non Af Amer: >60 mL/min (ref >60)
Glucose, Bld: 131 mg/dL — ABNORMAL HIGH (ref 70–99)
Potassium: 3.8 mmol/L (ref 3.5–5.1)
Sodium: 138 mmol/L (ref 135–145)
Total Bilirubin: 0.4 mg/dL (ref 0.3–1.2)
Total Protein: 6.2 g/dL — ABNORMAL LOW (ref 6.5–8.1)

## 2018-11-22 LAB — CBC WITH DIFFERENTIAL/PLATELET
Abs Immature Granulocytes: 0.03 10*3/uL (ref 0.00–0.07)
Basophils Absolute: 0 10*3/uL (ref 0.0–0.1)
Basophils Relative: 1 %
Eosinophils Absolute: 0 10*3/uL (ref 0.0–0.5)
Eosinophils Relative: 0 %
HCT: 45 % (ref 36.0–46.0)
Hemoglobin: 14.5 g/dL (ref 12.0–15.0)
Immature Granulocytes: 1 %
Lymphocytes Relative: 14 %
Lymphs Abs: 0.9 10*3/uL (ref 0.7–4.0)
MCH: 30.3 pg (ref 26.0–34.0)
MCHC: 32.2 g/dL (ref 30.0–36.0)
MCV: 94.1 fL (ref 80.0–100.0)
Monocytes Absolute: 0.3 10*3/uL (ref 0.1–1.0)
Monocytes Relative: 4 %
Neutro Abs: 5.2 10*3/uL (ref 1.7–7.7)
Neutrophils Relative %: 80 %
Platelets: 264 10*3/uL (ref 150–400)
RBC: 4.78 MIL/uL (ref 3.87–5.11)
RDW: 13.7 % (ref 11.5–15.5)
WBC: 6.5 10*3/uL (ref 4.0–10.5)
nRBC: 0 % (ref 0.0–0.2)

## 2018-11-22 LAB — MAGNESIUM: Magnesium: 2.6 mg/dL — ABNORMAL HIGH (ref 1.7–2.4)

## 2018-11-22 LAB — LACTATE DEHYDROGENASE: LDH: 453 U/L — ABNORMAL HIGH (ref 98–192)

## 2018-11-22 LAB — HEPATITIS B SURFACE ANTIBODY, QUANTITATIVE: Hep B S AB Quant (Post): <3.1 m[IU]/mL — ABNORMAL LOW (ref >9.9)

## 2018-11-22 LAB — BRAIN NATRIURETIC PEPTIDE: B Natriuretic Peptide: 108.3 pg/mL — ABNORMAL HIGH (ref 0.0–100.0)

## 2018-11-22 LAB — FERRITIN: Ferritin: 1753 ng/mL — ABNORMAL HIGH (ref 11–307)

## 2018-11-22 LAB — HEPATITIS B SURFACE ANTIGEN: Hepatitis B Surface Ag: NEGATIVE

## 2018-11-22 LAB — D-DIMER, QUANTITATIVE: D-Dimer, Quant: 1.32 ug/mL-FEU — ABNORMAL HIGH (ref 0.00–0.50)

## 2018-11-22 LAB — C-REACTIVE PROTEIN: CRP: 11.2 mg/dL — ABNORMAL HIGH (ref <1.0)

## 2018-11-22 MED ORDER — CALCIUM CARBONATE ANTACID 500 MG PO CHEW
1.0000 | CHEWABLE_TABLET | Freq: Every day | ORAL | Status: DC
  Filled 2018-11-22: qty 1

## 2018-11-22 MED ORDER — CALCIUM CARBONATE ANTACID 500 MG PO CHEW
1.0000 | CHEWABLE_TABLET | Freq: Two times a day (BID) | ORAL | Status: DC | PRN
  Administered 2018-11-22 – 2018-11-25 (×6): 200 mg via ORAL
  Filled 2018-11-22 (×6): qty 1

## 2018-11-22 MED ORDER — ENOXAPARIN SODIUM 40 MG/0.4ML ~~LOC~~ SOLN
40.0000 mg | SUBCUTANEOUS | Status: DC
  Administered 2018-11-22 – 2018-11-26 (×5): 40 mg via SUBCUTANEOUS
  Filled 2018-11-22 (×5): qty 0.4

## 2018-11-22 NOTE — Progress Notes (Signed)
PROGRESS NOTE                                                                                                                                                                                                             Patient Demographics:    Bonnie Brady, is a 68 y.o. female, DOB - 1950-12-10, [id number]  Outpatient Primary MD for the patient is [name], NP    LOS - 1  Admit date - 11/20/2018    Chief Complaint  Patient presents with  . Shortness of Breath    COVID +       Brief Narrative   - Bonnie Brady is a 68 y.o. female with history of hyperlipidemia who had recently traveled to [place] to attend a funeral and came back 4 days ago with complaints of generalized body ache fever chills nausea vomiting and diarrhea was diagnosed with COVID-19.  Subsequent which patient started getting more weaker and patient's family checked her oxygen sats at home was found to be hypoxic and patient getting more short of breath, she was admitted with 6 L nasal cannula oxygen on 11/21/2018 due to COVID-19 pneumonitis.   Subjective:    Bonnie Brady today has, No headache, No chest pain, No abdominal pain - No Nausea, No new weakness tingling or numbness, much improved Cough - SOB.     Assessment  & Plan :     1. Acute Hypoxic Resp. Failure due to Acute Covid 19 Viral Pneumonitis during the ongoing 2020 Covid 19 Pandemic - she was up to 6 L nasal cannula oxygen with pulse ox barely 90% yesterday evening, she was started promptly on combination of IV steroids, Actemra and IV Remdisvir with excellent improvement within 24 hours.  Currently she is down to 2 L nasal cannula oxygen and oxygenating around 94%.  She feels a whole lot better.  We will continue to monitor her clinically along with her inflammatory markers on IV steroids and IV Remdisvir.  She was requested to sit up in chair use flutter valve and I asked for  pulmonary toiletry and prone at night when sleeping.  Kindly see yesterday's note from me on Actemra consent.   [place]    11/21/18 0452 11/22/18 0237  DDIMER  --  1.32*  FERRITIN 1,364* 1,753*  LDH 444* 453*  CRP 15.4* 11.2*    Lab Results  Component Value Date   SARSCOV2NAA Detected (A) 11/17/2018     Hepatic Function Latest Ref Rng & Units 11/22/2018 11/21/2018 11/20/2018  Total Protein 6.5 - 8.1 g/dL 6.2(L) 5.7(L) 5.8(L)  Albumin 3.5 - 5.0 g/dL 2.7(L) 2.5(L) 2.7(L)  AST 15 - 41 U/L 45(H) 35 38  ALT 0 - 44 U/L '29 22 21  ' Alk Phosphatase 38 - 126 U/L 45 43 44  Total Bilirubin 0.3 - 1.2 mg/dL 0.4 0.8 0.8  Bilirubin, Direct 0.0 - 0.3 mg/dL - - -        Component Value Date/Time   BNP 108.3 (H) 11/22/2018 0237      2. Dyslipidemia.  On statins.  3.  Mild COVID-19 related transaminitis.  Stable monitor.    Condition -  Guarded  Family Communication  :  None  Code Status :  Full  Diet :   Diet Order            Diet Heart Room service appropriate? Yes; Fluid consistency: Thin  Diet effective now               Disposition Plan  :  Home 3-4 days  Consults  :  None  Procedures  :     PUD Prophylaxis : None  DVT Prophylaxis  :  Lovenox  ordered  Lab Results  Component Value Date   PLT 264 11/22/2018    Inpatient Medications  Scheduled Meds: . methylPREDNISolone (SOLU-MEDROL) injection  60 mg Intravenous Q12H  . metoprolol tartrate  25 mg Oral BID  . rosuvastatin  10 mg Oral Daily  . vitamin C  500 mg Oral Daily  . zinc sulfate  220 mg Oral Daily   Continuous Infusions: . remdesivir 100 mg in NS 250 mL     PRN Meds:.acetaminophen **OR** [DISCONTINUED] acetaminophen, albuterol, [DISCONTINUED] ondansetron **OR** ondansetron (ZOFRAN) IV  Antibiotics  :    Anti-infectives (From admission, onward)   Start     Dose/Rate Route Frequency Ordered Stop   11/22/18 1000  remdesivir 100 mg in sodium chloride 0.9 % 250 mL IVPB      100 mg 500 mL/hr over 30 Minutes Intravenous Every 24 hours 11/21/18 0737 11/26/18 0959   11/21/18 0900  remdesivir 200 mg in sodium chloride 0.9 % 250 mL IVPB     200 mg 500 mL/hr over 30 Minutes Intravenous Once 11/21/18 0737 11/21/18 0935       Time Spent in minutes  [place] M.D on 11/22/2018 at 9:25 AM  To page go to [email or web address] - password [facility]  [facility] -  Office  [phone]    See all Orders from today for further details    Objective:   Vitals:   11/21/18 1700 11/21/18 2000 11/22/18 0445 11/22/18 0758  BP:  122/73 128/78   Pulse:  83 66 79  Resp:  (!) 21 19 (!) 24  Temp:  97.6 F (36.4 C) 97.6 F (36.4 C) 97.6 F (36.4 C)  TempSrc:  Oral Oral Oral  SpO2:  91% 95% 96%  Weight: 78.2 kg     Height: '5\' 7"'  (1.702 m) 5' 7.5" (1.715 m)      Wt Readings from Last 3 Encounters:  11/21/18 78.2 kg  02/16/18 81.6 kg  02/09/18 83.2 kg     Intake/Output Summary (Last 24 hours) at 11/22/2018 0925 Last data filed at 11/22/2018 5916 Gross per 24 hour  Intake 1210 ml  Output -  Net 1210 ml     Physical Exam  Awake Alert, Oriented X 3, No new F.N deficits, Normal affect [place].AT,PERRAL Supple Neck,No JVD, No cervical lymphadenopathy appriciated.  Symmetrical Chest wall movement, Good air movement bilaterally, CTAB RRR,No Gallops,Rubs or new Murmurs, No Parasternal Heave +ve B.Sounds, Abd Soft, No tenderness, No organomegaly appriciated, No rebound - guarding or rigidity. No Cyanosis, Clubbing or edema, No new Rash or bruise      Data Review:    CBC Recent Labs  Lab 11/20/18 2300 11/21/18 0452 11/22/18 0237  WBC 8.2 5.8 6.5  HGB 15.0 14.5 14.5  HCT 44.5 44.3 45.0  PLT 196 187 264  MCV 92.1 94.1 94.1  MCH 31.1 30.8 30.3  MCHC 33.7 32.7 32.2  RDW 13.6 13.5 13.7  LYMPHSABS 0.9 0.3* 0.9  MONOABS 0.3 0.2 0.3  EOSABS 0.0 0.0 0.0  BASOSABS 0.0 0.0 0.0    Chemistries  Recent Labs  Lab 11/20/18 2300 11/21/18 0452 11/22/18 0237   NA 135 137 138  K 3.4* 3.6 3.8  CL 101 103 104  CO2 21* 23 24  GLUCOSE 116* 143* 131*  BUN '14 11 19  ' CREATININE 0.88 0.98 0.72  CALCIUM 8.5* 8.4* 8.5*  MG  --   --  2.6*  AST 38 35 45*  ALT '21 22 29  ' ALKPHOS 44 43 45  BILITOT 0.8 0.8 0.4   ------------------------------------------------------------------------------------------------------------------ No results for input(s): CHOL, HDL, LDLCALC, TRIG, CHOLHDL, LDLDIRECT in the last 72 hours.  No results found for: HGBA1C ------------------------------------------------------------------------------------------------------------------ No results for input(s): TSH, T4TOTAL, T3FREE, THYROIDAB in the last 72 hours.  Invalid input(s): FREET3  Cardiac Enzymes No results for input(s): CKMB, TROPONINI, MYOGLOBIN in the last 168 hours.  Invalid input(s): CK ------------------------------------------------------------------------------------------------------------------    Component Value Date/Time   BNP 108.3 (H) 11/22/2018 9480    Micro Results Recent Results (from the past 240 hour(s))  Novel Coronavirus, NAA ([facility])     Status: Abnormal   Collection Time: 11/17/18  1:17 PM   Specimen: Oropharyngeal(OP) collection in vial transport medium   OROPHARYNGEA  TESTING  Result Value Ref Range Status   SARS-CoV-2, NAA Detected (A) Not Detected Final    Comment: This test was developed and its performance characteristics determined by [employer]. This test has not been FDA cleared or approved. This test has been authorized by FDA under an Emergency Use Authorization (EUA). This test is only authorized for the duration of time the declaration that circumstances exist justifying the authorization of the emergency use of in vitro diagnostic tests for detection of SARS-CoV-2 virus and/or diagnosis of COVID-19 infection under section 564(b)(1) of the Act, 21 U.S.C. 165VVZ-4(M)(2), unless the authorization is terminated or  revoked sooner. When diagnostic testing is negative, the possibility of a false negative result should be considered in the context of a patient's recent exposures and the presence of clinical signs and symptoms consistent with COVID-19. An individual without symptoms of COVID-19 and who is not shedding SARS-CoV-2 virus would expect to have a negative (not detected) result in this assay.     Radiology Reports Dg Chest Portable 1 View  Result Date: 11/20/2018 CLINICAL DATA:  Shortness of breath EXAM: PORTABLE CHEST 1 VIEW COMPARISON:  None. FINDINGS: Heart is borderline in size. Patchy bibasilar airspace opacities. No effusions or acute bony abnormality. IMPRESSION: Patchy bibasilar atelectasis or infiltrates. Electronically Signed   By: Rolm Baptise M.D.   On: 11/20/2018 23:25

## 2018-11-22 NOTE — Progress Notes (Signed)
Patient's son in law Alonza Smoker called concerned about patient receiving metoprolol. He stated patient is usually not on BP medication and would like medication held until he can speak to Dr Candiss Norse. Primary RN Ninon made aware of family's request. Will pass on to dayshift Charge nurse to have Dr Candiss Norse reach out to family.

## 2018-11-22 NOTE — Evaluation (Signed)
Physical Therapy Evaluation Patient Details Name: Bonnie Brady MRN: [id number] DOB: 06-22-1950 Today's Date: 11/22/2018   History of Present Illness  68 y.o. female with history of hyperlipidemia who had recently traveled to [place] to attend her daughter's funeral and came back 4 days PTA with complaints of generalized body ache fever chills nausea vomiting and diarrhea was diagnosed with COVID-19. Her husband is also hospitalized  Clinical Impression   Pt admitted with above diagnosis. Patient functionally limited by decr cardiopulmonary status and requires supplemental oxygen, coaching re: pace and use of breathing techniques to keep her sats above 88% with activity. She was slightly unsteady walking at slower pace, however did not require external support and improved with cues to widen her BOS. Pt currently with functional limitations due to the deficits listed below (see PT Problem List). Pt will benefit from skilled PT to increase their independence and safety with mobility to allow discharge to the venue listed below.    NOTE: Patient on 2L on arrival with sats 94%; placed on room air during interview/history and sats decr to 86% at rest. Resumed 1L O2 and educated on pursed lip breathing with increase to 88% after several minutes; incr to 2L with incr to 90%, however as walking required incr to 3L for sats 86-91%. 2 standing rest breaks of <30 sec to achieve sats >88%. At end of session, pt sitting upright in chair on 2L with sats 89-90%.     Follow Up Recommendations No PT follow up    Equipment Recommendations  None recommended by PT    Recommendations for Other Services OT consult     Precautions / Restrictions Precautions Precautions: Other (comment) Precaution Comments: monitor O2 sats      Mobility  Bed Mobility                  Transfers Overall transfer level: Needs assistance Equipment used: None Transfers: Sit to/from Stand Sit to Stand: Min guard          General transfer comment: reported slight dizziness and with decr sats; closeguarding for safety  Ambulation/Gait Ambulation/Gait assistance: Min guard Gait Distance (Feet): 140 Feet Assistive device: None Gait Pattern/deviations: Step-through pattern;Decreased stride length;Wide base of support     General Gait Details: self-selects slow velocity to assist with her breathing; vc for wider BOS to assist with slight feeling of unsteady; required 2 standing rest breaks for bringing up SpO2  Stairs            Wheelchair Mobility    Modified Rankin (Stroke Patients Only)       Balance Overall balance assessment: Mild deficits observed, not formally tested                                           Pertinent Vitals/Pain Pain Assessment: No/denies pain    Home Living Family/patient expects to be discharged to:: Private residence Living Arrangements: Spouse/significant other Available Help at Discharge: Family;Friend(s);Available 24 hours/day Type of Home: House Home Access: Stairs to enter Entrance Stairs-Rails: None(of note, spouse also hospitalized and stated 1 rail) Technical brewer of Steps: 3-5 Home Layout: One level Home Equipment: None      Prior Function Level of Independence: Independent               Hand Dominance        Extremity/Trunk Assessment   Upper Extremity  Assessment Upper Extremity Assessment: Generalized weakness    Lower Extremity Assessment Lower Extremity Assessment: Generalized weakness    Cervical / Trunk Assessment Cervical / Trunk Assessment: Normal  Communication   Communication: No difficulties  Cognition Arousal/Alertness: Awake/alert Behavior During Therapy: WFL for tasks assessed/performed Overall Cognitive Status: Within Functional Limits for tasks assessed                                        General Comments      Exercises Other Exercises Other Exercises:  educated in use of flutter valve and pt completed 3 rounds with coughing after each and +phlegm expelled Other Exercises: Educated to sit with feet on floor (not reclined) at times to allow heel raises/toe raises, knee extension, and seated marching (pt return demo'd several reps of each)   Assessment/Plan    PT Assessment Patient needs continued PT services  PT Problem List Decreased activity tolerance;Decreased balance;Decreased mobility;Decreased knowledge of precautions;Cardiopulmonary status limiting activity       PT Treatment Interventions Gait training;Functional mobility training;Therapeutic activities;Therapeutic exercise;Balance training;Patient/family education    PT Goals (Current goals can be found in the Care Plan section)  Acute Rehab PT Goals Patient Stated Goal: to feel less fatigued PT Goal Formulation: With patient Time For Goal Achievement: 12/06/18 Potential to Achieve Goals: Good    Frequency Min 3X/week   Barriers to discharge        Co-evaluation               AM-PAC PT "6 Clicks" Mobility  Outcome Measure Help needed turning from your back to your side while in a flat bed without using bedrails?: None Help needed moving from lying on your back to sitting on the side of a flat bed without using bedrails?: None Help needed moving to and from a bed to a chair (including a wheelchair)?: A Little Help needed standing up from a chair using your arms (e.g., wheelchair or bedside chair)?: A Little Help needed to walk in hospital room?: A Little Help needed climbing 3-5 steps with a railing? : A Little 6 Click Score: 20    End of Session Equipment Utilized During Treatment: Gait belt;Oxygen Activity Tolerance: Patient limited by fatigue;Treatment limited secondary to medical complications (Comment)(decr sats) Patient left: in chair;with call bell/phone within reach Nurse Communication: Mobility status;Other (comment)(decr sats on room air and walking w/  <3L O2) PT Visit Diagnosis: Unsteadiness on feet (R26.81);Difficulty in walking, not elsewhere classified (R26.2)    Time: [id number] PT Time Calculation (min) (ACUTE ONLY): 33 min   Charges:   PT Evaluation $PT Eval Low Complexity: 1 Low PT Treatments $Gait Training: 8-22 mins          Barry Brunner, PT      Rexanne Mano 11/22/2018, 12:47 PM

## 2018-11-22 NOTE — Progress Notes (Signed)
Patient and family refused the bedtime metoprolol.  Pt. Bonnie Brady she is is not hypertensive.  Per outgoing nurse today, med is prescribe for prolong QTC. Dr. Olevia Bowens page.

## 2018-11-22 NOTE — Progress Notes (Signed)
ANTICOAGULATION CONSULT NOTE - Initial Consult  Pharmacy Consult for Lovenox Indication: VTE prophylaxis  Allergies  Allergen Reactions  . Ampicillin Hives    Questionable. Had a rash  . Latex Other (See Comments)    Contact with powder.  . Tetanus Toxoids Other (See Comments)    Swelling / redness  . Nickel Rash    Blisters    Patient Measurements: Height: 5' 7.5" (171.5 cm) Weight: 172 lb 6.4 oz (78.2 kg) IBW/kg (Calculated) : 62.75  Vital Signs: Temp: 97.6 F (36.4 C) (08/25 0758) Temp Source: Oral (08/25 0758) BP: 128/78 (08/25 0445) Pulse Rate: 79 (08/25 0758)  Labs: Recent Labs    11/20/18 2300 11/21/18 0452 11/22/18 0237  HGB 15.0 14.5 14.5  HCT 44.5 44.3 45.0  PLT 196 187 264  CREATININE 0.88 0.98 0.72    Estimated Creatinine Clearance: 74.3 mL/min (by C-G formula based on SCr of 0.72 mg/dL).   Medical History: Past Medical History:  Diagnosis Date  . Hyperlipidemia   . Kidney stone   . Tachycardia   . Varicose veins     Medications:  Scheduled:  . methylPREDNISolone (SOLU-MEDROL) injection  60 mg Intravenous Q12H  . metoprolol tartrate  25 mg Oral BID  . rosuvastatin  10 mg Oral Daily  . vitamin C  500 mg Oral Daily  . zinc sulfate  220 mg Oral Daily    Assessment: 41 yoF admitted 8/23 with COVID-19 pneumonia. PHARMACY CONSULT: Lovenox for VTE prophylaxis   Wt: 78 kg BMI:  26 Scr:  0.7, CrCl >30 ml/hr  D-dimer < 5 CBC: Hgb and Plt stable, WNL.  Goal of Therapy:  Monitor platelets by anticoagulation protocol: Yes   Plan:  Lovenox 40mg  SQ q24h Pharmacy will sign off.    Gretta Arab PharmD, BCPS Clinical pharmacist phone 7am- 5pm: [phone] 11/22/2018 9:34 AM

## 2018-11-23 LAB — COMPREHENSIVE METABOLIC PANEL
ALT: 29 U/L (ref 0–44)
AST: 29 U/L (ref 15–41)
Albumin: 2.9 g/dL — ABNORMAL LOW (ref 3.5–5.0)
Alkaline Phosphatase: 46 U/L (ref 38–126)
Anion gap: 11 (ref 5–15)
BUN: 24 mg/dL — ABNORMAL HIGH (ref 8–23)
CO2: 24 mmol/L (ref 22–32)
Calcium: 8.7 mg/dL — ABNORMAL LOW (ref 8.9–10.3)
Chloride: 104 mmol/L (ref 98–111)
Creatinine, Ser: 0.76 mg/dL (ref 0.44–1.00)
GFR calc Af Amer: >60 mL/min (ref >60)
GFR calc non Af Amer: >60 mL/min (ref >60)
Glucose, Bld: 149 mg/dL — ABNORMAL HIGH (ref 70–99)
Potassium: 3.9 mmol/L (ref 3.5–5.1)
Sodium: 139 mmol/L (ref 135–145)
Total Bilirubin: 0.5 mg/dL (ref 0.3–1.2)
Total Protein: 6.1 g/dL — ABNORMAL LOW (ref 6.5–8.1)

## 2018-11-23 LAB — CBC WITH DIFFERENTIAL/PLATELET
Abs Immature Granulocytes: 0.08 10*3/uL — ABNORMAL HIGH (ref 0.00–0.07)
Basophils Absolute: 0.1 10*3/uL (ref 0.0–0.1)
Basophils Relative: 0 %
Eosinophils Absolute: 0 10*3/uL (ref 0.0–0.5)
Eosinophils Relative: 0 %
HCT: 45.8 % (ref 36.0–46.0)
Hemoglobin: 14.5 g/dL (ref 12.0–15.0)
Immature Granulocytes: 1 %
Lymphocytes Relative: 9 %
Lymphs Abs: 1 10*3/uL (ref 0.7–4.0)
MCH: 30.1 pg (ref 26.0–34.0)
MCHC: 31.7 g/dL (ref 30.0–36.0)
MCV: 95 fL (ref 80.0–100.0)
Monocytes Absolute: 0.4 10*3/uL (ref 0.1–1.0)
Monocytes Relative: 4 %
Neutro Abs: 9.7 10*3/uL — ABNORMAL HIGH (ref 1.7–7.7)
Neutrophils Relative %: 86 %
Platelets: 301 10*3/uL (ref 150–400)
RBC: 4.82 MIL/uL (ref 3.87–5.11)
RDW: 13.8 % (ref 11.5–15.5)
WBC: 11.2 10*3/uL — ABNORMAL HIGH (ref 4.0–10.5)
nRBC: 0 % (ref 0.0–0.2)

## 2018-11-23 LAB — LACTATE DEHYDROGENASE: LDH: 395 U/L — ABNORMAL HIGH (ref 98–192)

## 2018-11-23 LAB — C-REACTIVE PROTEIN: CRP: 5 mg/dL — ABNORMAL HIGH (ref <1.0)

## 2018-11-23 LAB — BRAIN NATRIURETIC PEPTIDE: B Natriuretic Peptide: 88.2 pg/mL (ref 0.0–100.0)

## 2018-11-23 LAB — FERRITIN: Ferritin: 1715 ng/mL — ABNORMAL HIGH (ref 11–307)

## 2018-11-23 LAB — D-DIMER, QUANTITATIVE: D-Dimer, Quant: 1.02 ug/mL-FEU — ABNORMAL HIGH (ref 0.00–0.50)

## 2018-11-23 LAB — MAGNESIUM: Magnesium: 2.3 mg/dL (ref 1.7–2.4)

## 2018-11-23 NOTE — Progress Notes (Signed)
Occupational Therapy Evaluation Patient Details Name: Bonnie Brady MRN: [id number] DOB: December 23, 1950 Today's Date: 11/23/2018    History of Present Illness 68 y.o. female with history of hyperlipidemia who had recently traveled to [place] to attend her daughter's funeral and came back 4 days PTA with complaints of generalized body ache fever chills nausea vomiting and diarrhea was diagnosed with COVID-19. Her husband is also hospitalized   Clinical Impression   PTA, pt independent with ADL and mobility. Very active, worked out at [employer] and enjoys spending time with her grand children. Pt talked about her daughter's recent death after her battle with cancer. Pt able to complete ADL session on 2L with 2/4 DOE and SpO2 @ 91. Educated on pursed lip breathing and energy conservation. Pt completed 10 reps using flutter valve and IS with SpO2 @ 93 on RA. Pt left on RA - nsg staff aware. Pt very appreciative. Recommend 3in1 for use as shower chair at DC. Will follow acutely.    Follow Up Recommendations  No OT follow up;Supervision - Intermittent    Equipment Recommendations  3 in 1 bedside commode    Recommendations for Other Services       Precautions / Restrictions Precautions Precautions: Other (comment) Precaution Comments: monitor O2 sats      Mobility Bed Mobility Overal bed mobility: Modified Independent                Transfers Overall transfer level: Needs assistance Equipment used: None Transfers: Sit to/from Stand Sit to Stand: Supervision              Balance Overall balance assessment: No apparent balance deficits (not formally assessed)                                         ADL either performed or assessed with clinical judgement   ADL Overall ADL's : Needs assistance/impaired     Grooming: Set up;Sitting   Upper Body Bathing: Set up;Sitting   Lower Body Bathing: Set up;Sit to/from stand   Upper Body Dressing : Set  up;Sitting   Lower Body Dressing: Set up;Sit to/from stand   Toilet Transfer: Supervision/safety;Ambulation;Grab bars;Comfort height toilet   Toileting- Clothing Manipulation and Hygiene: Modified independent       Functional mobility during ADLs: Supervision/safety General ADL Comments: Began educating pt on energy conservation; 2/4 dyspnea during ADL; educated on pursed lip breathing     Vision         Perception     Praxis      Pertinent Vitals/Pain       Hand Dominance     Extremity/Trunk Assessment Upper Extremity Assessment Upper Extremity Assessment: Generalized weakness   Lower Extremity Assessment Lower Extremity Assessment: Defer to PT evaluation   Cervical / Trunk Assessment Cervical / Trunk Assessment: Normal   Communication Communication Communication: No difficulties   Cognition Arousal/Alertness: Awake/alert Behavior During Therapy: WFL for tasks assessed/performed Overall Cognitive Status: Within Functional Limits for tasks assessed                                     General Comments       Exercises Exercises: Other exercises Other Exercises Other Exercises: educated in use of flutter valve and IS - 10 reps each   Shoulder Instructions  Home Living Family/patient expects to be discharged to:: Private residence Living Arrangements: Spouse/significant other Available Help at Discharge: Family;Friend(s);Available 24 hours/day Type of Home: House Home Access: Stairs to enter [employer] of Steps: 3-5 Entrance Stairs-Rails: None(of note, spouse also hospitalized and stated 1 rail) Home Layout: One level     Bathroom Shower/Tub: Tub/shower unit;Door   [employer] Toilet: Standard Bathroom Accessibility: Yes How Accessible: Accessible via walker Home Equipment: None          Prior Functioning/Environment Level of Independence: Independent        Comments: works out at [employer]; enjoys watching  her grand daughter play volleyball; daughter dies from cancer @ 87 yo        OT Problem List: Decreased activity tolerance;Cardiopulmonary status limiting activity      OT Treatment/Interventions: Self-care/ADL training;Neuromuscular education;Energy conservation;DME and/or AE instruction;Therapeutic activities;Patient/family education    OT Goals(Current goals can be found in the care plan section) Acute Rehab OT Goals Patient Stated Goal: to get better OT Goal Formulation: With patient Time For Goal Achievement: 12/07/18 Potential to Achieve Goals: Good  OT Frequency: Min 2X/week   Barriers to D/C:            Co-evaluation              AM-PAC OT "6 Clicks" Daily Activity     Outcome Measure Help from another person eating meals?: None Help from another person taking care of personal grooming?: A Little Help from another person toileting, which includes using toliet, bedpan, or urinal?: A Little Help from another person bathing (including washing, rinsing, drying)?: A Little Help from another person to put on and taking off regular upper body clothing?: A Little Help from another person to put on and taking off regular lower body clothing?: A Little 6 Click Score: 19   End of Session Equipment Utilized During Treatment: Oxygen(2L) Nurse Communication: Mobility status  Activity Tolerance: Patient tolerated treatment well Patient left: in chair;with call bell/phone within reach  OT Visit Diagnosis: Muscle weakness (generalized) (M62.81)                Time: [id number] OT Time Calculation (min): 41 min Charges:  OT General Charges $OT Visit: 1 Visit OT Evaluation $OT Eval Low Complexity: 1 Low OT Treatments $Self Care/Home Management : 23-37 mins  Maurie Boettcher, OT/L   Acute OT Clinical Specialist Acute Rehabilitation Services Pager [phone] Office [phone]   [facility] 11/23/2018, 10:34 AM

## 2018-11-23 NOTE — Progress Notes (Signed)
[facility] PROGRESS NOTE    Progress Note  Bonnie Brady  [id number] DOB: 1950/07/15 DOA: 11/20/2018 PCP: [name], NP     Brief Narrative:   Bonnie Brady is an 68 y.o. female past medical history of hyperlipidemia who recently traveled to [place] to attend a funeral came back 4 days prior to admission complaining of generalized body aches nausea vomiting and diarrhea was diagnosed with COVID-19.  She progressively got worse with family check her pulse ox and she was found to be hypoxic, in the ED she was placed on 6 L nasal cannula with improvement in her saturations.  Assessment/Plan:   Acute respiratory failure with hypoxia due to Acute respiratory disease due to COVID-19 virus: He is requiring 2 L nasal cannula to keep saturations above 90%. He was started on IV steroids and IV Remdesivir on 11/21/2018. She was given IV Actemra on 11/21/2018. Keep the patient preferably 16 hours a day, set up patient on the floor, use flutter valve and incentive spirometry.  Hyperlipidemia: Continue statins.  Transaminitis: Likely due to COVID-19 infection.   DVT prophylaxis: lovenox Family Communication:Daughter Disposition Plan/Barrier to D/C: once she is off oxygen Code Status:     Code Status Orders  (From admission, onward)         Start     Ordered   11/21/18 0401  Full code  Continuous     11/21/18 0400        Code Status History    This patient has a current code status but no historical code status.   Advance Care Planning Activity        IV Access:    Peripheral IV   Procedures and diagnostic studies:   No results found.   Medical Consultants:    None.  Anti-Infectives:   Remdesivir  Subjective:    Bonnie Brady relate she feels better  Objective:    Vitals:   11/22/18 1530 11/22/18 2006 11/23/18 0543 11/23/18 0730  BP: 127/79 132/87 117/72 128/74  Pulse: 81 85 74 74  Resp: (!) 23 (!) 21 20 (!) 24  Temp:  97.9  F (36.6 C) 97.7 F (36.5 C) 97.7 F (36.5 C)  TempSrc:  Oral Oral Oral  SpO2: 90% 91% 93% 91%  Weight:      Height:       SpO2: 91 % O2 Flow Rate (L/min): 2 L/min   Intake/Output Summary (Last 24 hours) at 11/23/2018 0846 Last data filed at 11/23/2018 0544 Gross per 24 hour  Intake 853 ml  Output -  Net 853 ml   Filed Weights   11/21/18 1700  Weight: 78.2 kg    Exam: General exam: In no acute distress. Respiratory system: Good air movement and diffuse crackles bilaterally. Cardiovascular system: S1 & S2 heard, RRR. No JVD. Gastrointestinal system: Abdomen is nondistended, soft and nontender.  Central nervous system: Alert and oriented. No focal neurological deficits. Extremities: No pedal edema. Skin: No rashes, lesions or ulcers Psychiatry: Judgement and insight appear normal. Mood & affect appropriate.    Data Reviewed:    Labs: Basic Metabolic Panel: Recent Labs  Lab 11/20/18 2300 11/21/18 0452 11/22/18 0237 11/23/18 0200  NA 135 137 138 139  K 3.4* 3.6 3.8 3.9  CL 101 103 104 104  CO2 21* 23 24 24   GLUCOSE 116* 143* 131* 149*  BUN 14 11 19  24*  CREATININE 0.88 0.98 0.72 0.76  CALCIUM 8.5* 8.4* 8.5* 8.7*  MG  --   --  2.6* 2.3   GFR Estimated Creatinine Clearance: 74.3 mL/min (by C-G formula based on SCr of 0.76 mg/dL). Liver Function Tests: Recent Labs  Lab 11/20/18 2300 11/21/18 0452 11/22/18 0237 11/23/18 0200  AST 38 35 45* 29  ALT 21 22 29 29   ALKPHOS 44 43 45 46  BILITOT 0.8 0.8 0.4 0.5  PROT 5.8* 5.7* 6.2* 6.1*  ALBUMIN 2.7* 2.5* 2.7* 2.9*   No results for input(s): LIPASE, AMYLASE in the last 168 hours. No results for input(s): AMMONIA in the last 168 hours. Coagulation profile No results for input(s): INR, PROTIME in the last 168 hours. COVID-19 Labs  Recent Labs    11/21/18 0452 11/22/18 0237 11/23/18 0200  DDIMER  --  1.32* 1.02*  FERRITIN 1,364* 1,753* 1,715*  LDH 444* 453* 395*  CRP 15.4* 11.2* 5.0*    Lab  Results  Component Value Date   SARSCOV2NAA Detected (A) 11/17/2018    CBC: Recent Labs  Lab 11/20/18 2300 11/21/18 0452 11/22/18 0237 11/23/18 0200  WBC 8.2 5.8 6.5 11.2*  NEUTROABS 6.8 5.3 5.2 9.7*  HGB 15.0 14.5 14.5 14.5  HCT 44.5 44.3 45.0 45.8  MCV 92.1 94.1 94.1 95.0  PLT 196 187 264 301   Cardiac Enzymes: No results for input(s): CKTOTAL, CKMB, CKMBINDEX, TROPONINI in the last 168 hours. BNP (last 3 results) No results for input(s): PROBNP in the last 8760 hours. CBG: No results for input(s): GLUCAP in the last 168 hours. D-Dimer: Recent Labs    11/22/18 0237 11/23/18 0200  DDIMER 1.32* 1.02*   Hgb A1c: No results for input(s): HGBA1C in the last 72 hours. Lipid Profile: No results for input(s): CHOL, HDL, LDLCALC, TRIG, CHOLHDL, LDLDIRECT in the last 72 hours. Thyroid function studies: No results for input(s): TSH, T4TOTAL, T3FREE, THYROIDAB in the last 72 hours.  Invalid input(s): FREET3 Anemia work up: Recent Labs    11/22/18 0237 11/23/18 0200  FERRITIN 1,753* 1,715*   Sepsis Labs: Recent Labs  Lab 11/20/18 2300 11/21/18 0452 11/22/18 0237 11/23/18 0200  PROCALCITON  --  <0.10  --   --   WBC 8.2 5.8 6.5 11.2*  LATICACIDVEN 1.5  --   --   --    Microbiology Recent Results (from the past 240 hour(s))  Novel Coronavirus, NAA ([facility])     Status: Abnormal   Collection Time: 11/17/18  1:17 PM   Specimen: Oropharyngeal(OP) collection in vial transport medium   OROPHARYNGEA  TESTING  Result Value Ref Range Status   SARS-CoV-2, NAA Detected (A) Not Detected Final    Comment: This test was developed and its performance characteristics determined by [employer]. This test has not been FDA cleared or approved. This test has been authorized by FDA under an Emergency Use Authorization (EUA). This test is only authorized for the duration of time the declaration that circumstances exist justifying the authorization of the emergency use of  in vitro diagnostic tests for detection of SARS-CoV-2 virus and/or diagnosis of COVID-19 infection under section 564(b)(1) of the Act, 21 U.S.C. [id number]), unless the authorization is terminated or revoked sooner. When diagnostic testing is negative, the possibility of a false negative result should be considered in the context of a patient's recent exposures and the presence of clinical signs and symptoms consistent with COVID-19. An individual without symptoms of COVID-19 and who is not shedding SARS-CoV-2 virus would expect to have a negative (not detected) result in this assay.      Medications:   . enoxaparin (LOVENOX)  injection  40 mg Subcutaneous Q24H  . methylPREDNISolone (SOLU-MEDROL) injection  60 mg Intravenous Q12H  . metoprolol tartrate  25 mg Oral BID  . rosuvastatin  10 mg Oral Daily  . vitamin C  500 mg Oral Daily  . zinc sulfate  220 mg Oral Daily   Continuous Infusions: . remdesivir 100 mg in NS 250 mL 100 mg (11/23/18 0845)     LOS: 2 days   Charlynne Cousins  [facility]  11/23/2018, 8:46 AM

## 2018-11-23 NOTE — Plan of Care (Signed)
  Problem: Education: Goal: Knowledge of General Education information will improve Description: Including pain rating scale, medication(s)/side effects and non-pharmacologic comfort measures Outcome: Progressing   Problem: Health Behavior/Discharge Planning: Goal: Ability to manage health-related needs will improve Outcome: Progressing   Problem: Clinical Measurements: Goal: Ability to maintain clinical measurements within normal limits will improve Outcome: Progressing Goal: Will remain free from infection Outcome: Progressing Goal: Diagnostic test results will improve Outcome: Progressing Goal: Respiratory complications will improve Outcome: Progressing   Problem: Coping: Goal: Level of anxiety will decrease Outcome: Progressing   Problem: Elimination: Goal: Will not experience complications related to bowel motility Outcome: Progressing   Problem: Pain Managment: Goal: General experience of comfort will improve Outcome: Progressing   Problem: Safety: Goal: Ability to remain free from injury will improve Outcome: Progressing   Problem: Skin Integrity: Goal: Risk for impaired skin integrity will decrease Outcome: Progressing   Problem: Clinical Measurements: Goal: Cardiovascular complication will be avoided Outcome: Completed/Met   Problem: Activity: Goal: Risk for activity intolerance will decrease Outcome: Completed/Met   Problem: Nutrition: Goal: Adequate nutrition will be maintained Outcome: Completed/Met   Problem: Elimination: Goal: Will not experience complications related to urinary retention Outcome: Completed/Met

## 2018-11-24 LAB — COMPREHENSIVE METABOLIC PANEL
ALT: 26 U/L (ref 0–44)
AST: 25 U/L (ref 15–41)
Albumin: 2.6 g/dL — ABNORMAL LOW (ref 3.5–5.0)
Alkaline Phosphatase: 46 U/L (ref 38–126)
Anion gap: 11 (ref 5–15)
BUN: 22 mg/dL (ref 8–23)
CO2: 22 mmol/L (ref 22–32)
Calcium: 8.4 mg/dL — ABNORMAL LOW (ref 8.9–10.3)
Chloride: 105 mmol/L (ref 98–111)
Creatinine, Ser: 0.7 mg/dL (ref 0.44–1.00)
GFR calc Af Amer: >60 mL/min (ref >60)
GFR calc non Af Amer: >60 mL/min (ref >60)
Glucose, Bld: 162 mg/dL — ABNORMAL HIGH (ref 70–99)
Potassium: 4.1 mmol/L (ref 3.5–5.1)
Sodium: 138 mmol/L (ref 135–145)
Total Bilirubin: 0.2 mg/dL — ABNORMAL LOW (ref 0.3–1.2)
Total Protein: 5.2 g/dL — ABNORMAL LOW (ref 6.5–8.1)

## 2018-11-24 LAB — CBC WITH DIFFERENTIAL/PLATELET
Abs Immature Granulocytes: 0.08 10*3/uL — ABNORMAL HIGH (ref 0.00–0.07)
Basophils Absolute: 0 10*3/uL (ref 0.0–0.1)
Basophils Relative: 0 %
Eosinophils Absolute: 0 10*3/uL (ref 0.0–0.5)
Eosinophils Relative: 0 %
HCT: 40.5 % (ref 36.0–46.0)
Hemoglobin: 13.5 g/dL (ref 12.0–15.0)
Immature Granulocytes: 1 %
Lymphocytes Relative: 8 %
Lymphs Abs: 0.7 10*3/uL (ref 0.7–4.0)
MCH: 31 pg (ref 26.0–34.0)
MCHC: 33.3 g/dL (ref 30.0–36.0)
MCV: 93.1 fL (ref 80.0–100.0)
Monocytes Absolute: 0.5 10*3/uL (ref 0.1–1.0)
Monocytes Relative: 5 %
Neutro Abs: 8 10*3/uL — ABNORMAL HIGH (ref 1.7–7.7)
Neutrophils Relative %: 86 %
Platelets: 288 10*3/uL (ref 150–400)
RBC: 4.35 MIL/uL (ref 3.87–5.11)
RDW: 13.4 % (ref 11.5–15.5)
WBC: 9.3 10*3/uL (ref 4.0–10.5)
nRBC: 0 % (ref 0.0–0.2)

## 2018-11-24 LAB — C-REACTIVE PROTEIN: CRP: 2.1 mg/dL — ABNORMAL HIGH (ref <1.0)

## 2018-11-24 LAB — FERRITIN: Ferritin: 1083 ng/mL — ABNORMAL HIGH (ref 11–307)

## 2018-11-24 LAB — BRAIN NATRIURETIC PEPTIDE: B Natriuretic Peptide: 75.8 pg/mL (ref 0.0–100.0)

## 2018-11-24 LAB — MAGNESIUM: Magnesium: 2.3 mg/dL (ref 1.7–2.4)

## 2018-11-24 LAB — D-DIMER, QUANTITATIVE: D-Dimer, Quant: 0.58 ug/mL-FEU — ABNORMAL HIGH (ref 0.00–0.50)

## 2018-11-24 LAB — LACTATE DEHYDROGENASE: LDH: 420 U/L — ABNORMAL HIGH (ref 98–192)

## 2018-11-24 MED ORDER — DEXAMETHASONE 6 MG PO TABS
6.0000 mg | ORAL_TABLET | Freq: Every day | ORAL | Status: DC
  Administered 2018-11-24 – 2018-11-26 (×3): 6 mg via ORAL
  Filled 2018-11-24 (×3): qty 1

## 2018-11-24 NOTE — Progress Notes (Signed)
[facility] PROGRESS NOTE    Progress Note  Bonnie Brady  [id number] DOB: [birth year] DOA: 11/20/2018 PCP: [name], NP     Brief Narrative:   Bonnie Brady is an 68 y.o. female past medical history of hyperlipidemia who recently traveled to [place] to attend a funeral came back 4 days prior to admission complaining of generalized body aches nausea vomiting and diarrhea was diagnosed with COVID-19.  She progressively got worse with family check her pulse ox and she was found to be hypoxic, in the ED she was placed on 6 L nasal cannula with improvement in her saturations.  Assessment/Plan:   Acute respiratory failure with hypoxia due to Acute respiratory disease due to COVID-19 virus: She is on room air satting greater than 88%, she relates no dyspnea. Try to keep the patient prone for at least 16 hours a day, continue flutter valve and incentive spirometry. Need to finish her Remdesivir treatment in house. Inflamatory Laboratory markers continue to improve.  Hyperlipidemia: Continue statins.  Transaminitis: Likely due to COVID-19 infection.   DVT prophylaxis: lovenox Family Communication:Daughter Disposition Plan/Barrier to D/C: Hopefully home tomorrow. Code Status:     Code Status Orders  (From admission, onward)         Start     Ordered   11/21/18 0401  Full code  Continuous     11/21/18 0400        Code Status History    This patient has a current code status but no historical code status.   Advance Care Planning Activity        IV Access:    Peripheral IV   Procedures and diagnostic studies:   No results found.   Medical Consultants:    None.  Anti-Infectives:   Remdesivir  Subjective:    Bonnie Brady relates her breathing is back to baseline.  Objective:    Vitals:   11/23/18 0730 11/23/18 1555 11/23/18 2005 11/24/18 0419  BP: 128/74 132/74 131/78 115/63  Pulse: 74 74 73 65  Resp: (!) 24 (!) 22 19 20    Temp: 97.7 F (36.5 C) 97.8 F (36.6 C) 98.5 F (36.9 C) 98.1 F (36.7 C)  TempSrc: Oral Oral Oral Oral  SpO2: 91% 92% 93% 90%  Weight:      Height:       SpO2: 90 % O2 Flow Rate (L/min): 2 L/min   Intake/Output Summary (Last 24 hours) at 11/24/2018 0739 Last data filed at 11/23/2018 1800 Gross per 24 hour  Intake 490 ml  Output 2 ml  Net 488 ml   Filed Weights   11/21/18 1700  Weight: 78.2 kg    Exam: General exam: In no acute distress. Respiratory system: Good air movement and diffuse crackles bilaterally. Cardiovascular system: S1 & S2 heard, RRR. No JVD.  Gastrointestinal system: Abdomen is nondistended, soft and nontender.  Central nervous system: Alert and oriented. No focal neurological deficits. Extremities: No pedal edema. Skin: No rashes, lesions or ulcers Psychiatry: Judgement and insight appear normal. Mood & affect appropriate.   Data Reviewed:    Labs: Basic Metabolic Panel: Recent Labs  Lab 11/20/18 2300 11/21/18 0452 11/22/18 0237 11/23/18 0200 11/24/18 0500  NA 135 137 138 139 138  K 3.4* 3.6 3.8 3.9 4.1  CL 101 103 104 104 105  CO2 21* 23 24 24 22   GLUCOSE 116* 143* 131* 149* 162*  BUN 14 11 19  24* 22  CREATININE 0.88 0.98 0.72 0.76 0.70  CALCIUM  8.5* 8.4* 8.5* 8.7* 8.4*  MG  --   --  2.6* 2.3 2.3   GFR Estimated Creatinine Clearance: 74.3 mL/min (by C-G formula based on SCr of 0.7 mg/dL). Liver Function Tests: Recent Labs  Lab 11/20/18 2300 11/21/18 0452 11/22/18 0237 11/23/18 0200 11/24/18 0500  AST 38 35 45* 29 25  ALT 21 22 29 29 26   ALKPHOS 44 43 45 46 46  BILITOT 0.8 0.8 0.4 0.5 0.2*  PROT 5.8* 5.7* 6.2* 6.1* 5.2*  ALBUMIN 2.7* 2.5* 2.7* 2.9* 2.6*   No results for input(s): LIPASE, AMYLASE in the last 168 hours. No results for input(s): AMMONIA in the last 168 hours. Coagulation profile No results for input(s): INR, PROTIME in the last 168 hours. COVID-19 Labs  Recent Labs    11/22/18 0237 11/23/18 0200  11/24/18 0305 11/24/18 0500  DDIMER 1.32* 1.02*  --  0.58*  FERRITIN 1,753* 1,715* 1,083*  --   LDH 453* 395*  --  420*  CRP 11.2* 5.0* 2.1*  --     Lab Results  Component Value Date   SARSCOV2NAA Detected (A) 11/17/2018    CBC: Recent Labs  Lab 11/20/18 2300 11/21/18 0452 11/22/18 0237 11/23/18 0200 11/24/18 0500  WBC 8.2 5.8 6.5 11.2* 9.3  NEUTROABS 6.8 5.3 5.2 9.7* 8.0*  HGB 15.0 14.5 14.5 14.5 13.5  HCT 44.5 44.3 45.0 45.8 40.5  MCV 92.1 94.1 94.1 95.0 93.1  PLT 196 187 264 301 288   Cardiac Enzymes: No results for input(s): CKTOTAL, CKMB, CKMBINDEX, TROPONINI in the last 168 hours. BNP (last 3 results) No results for input(s): PROBNP in the last 8760 hours. CBG: No results for input(s): GLUCAP in the last 168 hours. D-Dimer: Recent Labs    11/23/18 0200 11/24/18 0500  DDIMER 1.02* 0.58*   Hgb A1c: No results for input(s): HGBA1C in the last 72 hours. Lipid Profile: No results for input(s): CHOL, HDL, LDLCALC, TRIG, CHOLHDL, LDLDIRECT in the last 72 hours. Thyroid function studies: No results for input(s): TSH, T4TOTAL, T3FREE, THYROIDAB in the last 72 hours.  Invalid input(s): FREET3 Anemia work up: Recent Labs    11/23/18 0200 11/24/18 0305  FERRITIN 1,715* 1,083*   Sepsis Labs: Recent Labs  Lab 11/20/18 2300 11/21/18 0452 11/22/18 0237 11/23/18 0200 11/24/18 0500  PROCALCITON  --  <0.10  --   --   --   WBC 8.2 5.8 6.5 11.2* 9.3  LATICACIDVEN 1.5  --   --   --   --    Microbiology Recent Results (from the past 240 hour(s))  Novel Coronavirus, NAA ([facility])     Status: Abnormal   Collection Time: 11/17/18  1:17 PM   Specimen: Oropharyngeal(OP) collection in vial transport medium   OROPHARYNGEA  TESTING  Result Value Ref Range Status   SARS-CoV-2, NAA Detected (A) Not Detected Final    Comment: This test was developed and its performance characteristics determined by [employer]. This test has not been FDA cleared or  approved. This test has been authorized by FDA under an Emergency Use Authorization (EUA). This test is only authorized for the duration of time the declaration that circumstances exist justifying the authorization of the emergency use of in vitro diagnostic tests for detection of SARS-CoV-2 virus and/or diagnosis of COVID-19 infection under section 564(b)(1) of the Act, 21 U.S.C. [id number]), unless the authorization is terminated or revoked sooner. When diagnostic testing is negative, the possibility of a false negative result should be considered in the context of  a patient's recent exposures and the presence of clinical signs and symptoms consistent with COVID-19. An individual without symptoms of COVID-19 and who is not shedding SARS-CoV-2 virus would expect to have a negative (not detected) result in this assay.      Medications:   . enoxaparin (LOVENOX) injection  40 mg Subcutaneous Q24H  . methylPREDNISolone (SOLU-MEDROL) injection  60 mg Intravenous Q12H  . metoprolol tartrate  25 mg Oral BID  . rosuvastatin  10 mg Oral Daily  . vitamin C  500 mg Oral Daily  . zinc sulfate  220 mg Oral Daily   Continuous Infusions: . remdesivir 100 mg in NS 250 mL Stopped (11/23/18 1900)     LOS: 3 days   Charlynne Cousins  [facility]  11/24/2018, 7:39 AM

## 2018-11-24 NOTE — Progress Notes (Signed)
Spouse was in pt's room, transitioning home this day.

## 2018-11-24 NOTE — Progress Notes (Signed)
Physical Therapy Treatment Patient Details Name: Bonnie Brady MRN: [id number] DOB: 05-27-1950 Today's Date: 11/24/2018    History of Present Illness 68 y.o. female with history of hyperlipidemia who had recently traveled to [place] to attend her daughter's funeral and came back 4 days PTA with complaints of generalized body ache fever chills nausea vomiting and diarrhea was diagnosed with COVID-19. Her husband is also hospitalized    PT Comments    Patient on room air on arrival with sats 94%. Ambulated to bath room, pt used bathroom, then out into the hall x10 feet when sats decr to 87%. Resumed O2 at 2L and with standing rest and breathing cues she incr to 90% and resumed walking. After additional 50 ft on 2L, pt sats 91-92%. After another 40 ft on 2L sats  dropping and instructed to take standing rest with pursed lip breathing. Sats on 2L down to 84-85% x 3 minutes. As pt was ~100 ft from her room and with slow recovery, increased to 3L O2 with pt able to raise sats to 90% with no talking, static standing, and pursed lip breathing. Encouraged slower pace to return to room and sats 90-88% on 3L. Once seated, able to incr her sats to 94% over ~3 minutes on 3L. Lunch had arrived and therefore left pt on [place] 2L O2 as she also planned to bathe after she finished eating. On departure sats 92% on 2L and made [place], [place] aware of decreased sats and incr O2 needs while walking. Encouraged patient to call for assist to go walking in the hallway. RN agrees pt can walk in room without assist.    Follow Up Recommendations  No PT follow up     Equipment Recommendations  None recommended by PT    Recommendations for Other Services       Precautions / Restrictions Precautions Precautions: Other (comment) Precaution Comments: monitor O2 sats    Mobility  Bed Mobility Overal bed mobility: Modified Independent                Transfers Overall transfer level: Modified independent Equipment  used: None Transfers: Sit to/from Stand Sit to Stand: Modified independent (Device/Increase time)         General transfer comment: good awareness of lines; x3 reps during session  Ambulation/Gait Ambulation/Gait assistance: Supervision Gait Distance (Feet): 200 Feet Assistive device: 1 person hand held assist(walked holding husband's hand) Gait Pattern/deviations: Step-through pattern;Decreased stride length;Wide base of support     General Gait Details: vc for slower pace, standing rest x3, and PLB to recover sats   Stairs             Wheelchair Mobility    Modified Rankin (Stroke Patients Only)       Balance Overall balance assessment: No apparent balance deficits (not formally assessed)                                          Cognition Arousal/Alertness: Awake/alert Behavior During Therapy: WFL for tasks assessed/performed Overall Cognitive Status: Within Functional Limits for tasks assessed                                        Exercises      General Comments        Pertinent Vitals/Pain  Pain Assessment: No/denies pain    Home Living                      Prior Function            PT Goals (current goals can now be found in the care plan section) Acute Rehab PT Goals Patient Stated Goal: to get better Time For Goal Achievement: 12/06/18 Potential to Achieve Goals: Good Progress towards PT goals: Progressing toward goals    Frequency    Min 3X/week      PT Plan Current plan remains appropriate    Co-evaluation              AM-PAC PT "6 Clicks" Mobility   Outcome Measure  Help needed turning from your back to your side while in a flat bed without using bedrails?: None Help needed moving from lying on your back to sitting on the side of a flat bed without using bedrails?: None Help needed moving to and from a bed to a chair (including a wheelchair)?: None Help needed standing up  from a chair using your arms (e.g., wheelchair or bedside chair)?: None Help needed to walk in hospital room?: None Help needed climbing 3-5 steps with a railing? : A Little 6 Click Score: 23    End of Session Equipment Utilized During Treatment: Oxygen Activity Tolerance: Treatment limited secondary to medical complications (Comment)(decr sats; lunch arrived while walking; pt deferred further ) Patient left: in chair;with call bell/phone within reach;with family/visitor present(husband also hospitalized and visiting her room) Nurse Communication: Mobility status;Other (comment)(decr sats on room air and walking w/ <3L O2) PT Visit Diagnosis: Unsteadiness on feet (R26.81);Difficulty in walking, not elsewhere classified (R26.2)     Time: [id number] PT Time Calculation (min) (ACUTE ONLY): 31 min  Charges:  $Gait Training: 23-37 mins                       Barry Brunner, PT       [place] [facility] Meg Niemeier 11/24/2018, 1:15 PM

## 2018-11-24 NOTE — Plan of Care (Signed)
  Problem: Education: Goal: Knowledge of General Education information will improve Description: Including pain rating scale, medication(s)/side effects and non-pharmacologic comfort measures Outcome: Progressing   Problem: Health Behavior/Discharge Planning: Goal: Ability to manage health-related needs will improve Outcome: Progressing   Problem: Clinical Measurements: Goal: Ability to maintain clinical measurements within normal limits will improve Outcome: Progressing Goal: Will remain free from infection Outcome: Progressing Goal: Diagnostic test results will improve Outcome: Progressing Goal: Respiratory complications will improve Outcome: Progressing   Problem: Coping: Goal: Level of anxiety will decrease Outcome: Progressing   Problem: Elimination: Goal: Will not experience complications related to bowel motility Outcome: Progressing   Problem: Pain Managment: Goal: General experience of comfort will improve Outcome: Progressing   Problem: Safety: Goal: Ability to remain free from injury will improve Outcome: Progressing   Problem: Skin Integrity: Goal: Risk for impaired skin integrity will decrease Outcome: Progressing

## 2018-11-25 LAB — CBC WITH DIFFERENTIAL/PLATELET
Abs Immature Granulocytes: 0.11 10*3/uL — ABNORMAL HIGH (ref 0.00–0.07)
Basophils Absolute: 0 10*3/uL (ref 0.0–0.1)
Basophils Relative: 0 %
Eosinophils Absolute: 0 10*3/uL (ref 0.0–0.5)
Eosinophils Relative: 0 %
HCT: 40 % (ref 36.0–46.0)
Hemoglobin: 13.3 g/dL (ref 12.0–15.0)
Immature Granulocytes: 1 %
Lymphocytes Relative: 8 %
Lymphs Abs: 0.9 10*3/uL (ref 0.7–4.0)
MCH: 30.6 pg (ref 26.0–34.0)
MCHC: 33.3 g/dL (ref 30.0–36.0)
MCV: 92.2 fL (ref 80.0–100.0)
Monocytes Absolute: 0.6 10*3/uL (ref 0.1–1.0)
Monocytes Relative: 6 %
Neutro Abs: 8.9 10*3/uL — ABNORMAL HIGH (ref 1.7–7.7)
Neutrophils Relative %: 85 %
Platelets: 279 10*3/uL (ref 150–400)
RBC: 4.34 MIL/uL (ref 3.87–5.11)
RDW: 13.2 % (ref 11.5–15.5)
WBC: 10.5 10*3/uL (ref 4.0–10.5)
nRBC: 0 % (ref 0.0–0.2)

## 2018-11-25 LAB — FERRITIN: Ferritin: 760 ng/mL — ABNORMAL HIGH (ref 11–307)

## 2018-11-25 LAB — C-REACTIVE PROTEIN: CRP: 1 mg/dL — ABNORMAL HIGH (ref <1.0)

## 2018-11-25 LAB — COMPREHENSIVE METABOLIC PANEL
ALT: 27 U/L (ref 0–44)
AST: 20 U/L (ref 15–41)
Albumin: 2.6 g/dL — ABNORMAL LOW (ref 3.5–5.0)
Alkaline Phosphatase: 43 U/L (ref 38–126)
Anion gap: 12 (ref 5–15)
BUN: 21 mg/dL (ref 8–23)
CO2: 23 mmol/L (ref 22–32)
Calcium: 8.5 mg/dL — ABNORMAL LOW (ref 8.9–10.3)
Chloride: 105 mmol/L (ref 98–111)
Creatinine, Ser: 0.75 mg/dL (ref 0.44–1.00)
GFR calc Af Amer: >60 mL/min (ref >60)
GFR calc non Af Amer: >60 mL/min (ref >60)
Glucose, Bld: 154 mg/dL — ABNORMAL HIGH (ref 70–99)
Potassium: 3.7 mmol/L (ref 3.5–5.1)
Sodium: 140 mmol/L (ref 135–145)
Total Bilirubin: 0.5 mg/dL (ref 0.3–1.2)
Total Protein: 5.1 g/dL — ABNORMAL LOW (ref 6.5–8.1)

## 2018-11-25 LAB — D-DIMER, QUANTITATIVE: D-Dimer, Quant: 0.66 ug/mL-FEU — ABNORMAL HIGH (ref 0.00–0.50)

## 2018-11-25 LAB — BRAIN NATRIURETIC PEPTIDE: B Natriuretic Peptide: 82.1 pg/mL (ref 0.0–100.0)

## 2018-11-25 LAB — MAGNESIUM: Magnesium: 2.2 mg/dL (ref 1.7–2.4)

## 2018-11-25 LAB — LACTATE DEHYDROGENASE: LDH: 357 U/L — ABNORMAL HIGH (ref 98–192)

## 2018-11-25 MED ORDER — POTASSIUM CHLORIDE CRYS ER 20 MEQ PO TBCR
40.0000 meq | EXTENDED_RELEASE_TABLET | Freq: Two times a day (BID) | ORAL | Status: AC
  Administered 2018-11-25 (×2): 40 meq via ORAL
  Filled 2018-11-25 (×2): qty 2

## 2018-11-25 MED ORDER — ALUM & MAG HYDROXIDE-SIMETH 200-200-20 MG/5ML PO SUSP
30.0000 mL | Freq: Four times a day (QID) | ORAL | Status: DC | PRN
  Administered 2018-11-25 – 2018-11-26 (×2): 30 mL via ORAL
  Filled 2018-11-25 (×2): qty 30

## 2018-11-25 NOTE — Progress Notes (Signed)
[facility] PROGRESS NOTE    Progress Note  Bonnie Brady  [id number] DOB: 07/31/1950 DOA: 11/20/2018 PCP: [name], NP     Brief Narrative:   Bonnie Brady is an 68 y.o. female past medical history of hyperlipidemia who recently traveled to [place] to attend a funeral came back 4 days prior to admission complaining of generalized body aches nausea vomiting and diarrhea was diagnosed with COVID-19.  She progressively got worse with family check her pulse ox and she was found to be hypoxic, in the ED she was placed on 6 L nasal cannula with improvement in her saturations.  Assessment/Plan:   Acute respiratory failure with hypoxia due to Acute respiratory disease due to COVID-19 virus: She is on room air satting greater than 88%, she relates no dyspnea. Try to keep the patient prone for at least 16 hours a day, continue flutter valve and incentive spirometry. Need to finish her Remdesivir treatment in house. Inflamatory Laboratory markers continue to improve. She was ambulated yesterday by PT and her saturations went down to 80% on room air she had to be back on oxygen it took her about 15 to 20 minutes to improve her saturations. Spoken to the patient now will observe her an additional 24 hours.  I have explained to her and stressed the importance of pronating.  Hyperlipidemia: Continue statins.  Transaminitis: Likely due to COVID-19 infection.   DVT prophylaxis: lovenox Family Communication:Daughter Disposition Plan/Barrier to D/C: Hopefully home on 8.29.2020 Code Status:     Code Status Orders  (From admission, onward)         Start     Ordered   11/21/18 0401  Full code  Continuous     11/21/18 0400        Code Status History    This patient has a current code status but no historical code status.   Advance Care Planning Activity        IV Access:    Peripheral IV   Procedures and diagnostic studies:   No results found.    Medical Consultants:    None.  Anti-Infectives:   Remdesivir  Subjective:    Bonnie Brady relates her breathing is back to baseline.  Objective:    Vitals:   11/24/18 1000 11/24/18 1609 11/24/18 1947 11/25/18 0405  BP:  130/78 140/74 127/79  Pulse: 71 67    Resp: (!) 21 (!) 21  20  Temp:  (!) 97.5 F (36.4 C) 98 F (36.7 C) 98 F (36.7 C)  TempSrc:  Oral Oral Oral  SpO2: (!) 89% 91%    Weight:      Height:       SpO2: 91 % O2 Flow Rate (L/min): 2 L/min   Intake/Output Summary (Last 24 hours) at 11/25/2018 1038 Last data filed at 11/24/2018 1800 Gross per 24 hour  Intake 250 ml  Output -  Net 250 ml   Filed Weights   11/21/18 1700  Weight: 78.2 kg    Exam: General exam: In no acute distress. Respiratory system: Good air movement and crackles at bases. Cardiovascular system: S1 & S2 heard, RRR. No JVD. Gastrointestinal system: Abdomen is nondistended, soft and nontender.  Central nervous system: Alert and oriented. No focal neurological deficits. Extremities: No pedal edema. Skin: No rashes, lesions or ulcers Psychiatry: Judgement and insight appear normal. Mood & affect appropriate.    Data Reviewed:    Labs: Basic Metabolic Panel: Recent Labs  Lab 11/21/18 0452 11/22/18  [id number] 11/23/18 0200 11/24/18 0500 11/25/18 0246  NA 137 138 139 138 140  K 3.6 3.8 3.9 4.1 3.7  CL 103 104 104 105 105  CO2 23 24 24 22 23   GLUCOSE 143* 131* 149* 162* 154*  BUN 11 19 24* 22 21  CREATININE 0.98 0.72 0.76 0.70 0.75  CALCIUM 8.4* 8.5* 8.7* 8.4* 8.5*  MG  --  2.6* 2.3 2.3 2.2   GFR Estimated Creatinine Clearance: 74.3 mL/min (by C-G formula based on SCr of 0.75 mg/dL). Liver Function Tests: Recent Labs  Lab 11/21/18 0452 11/22/18 0237 11/23/18 0200 11/24/18 0500 11/25/18 0246  AST 35 45* 29 25 20   ALT 22 29 29 26 27   ALKPHOS 43 45 46 46 43  BILITOT 0.8 0.4 0.5 0.2* 0.5  PROT 5.7* 6.2* 6.1* 5.2* 5.1*  ALBUMIN 2.5* 2.7* 2.9* 2.6* 2.6*   No  results for input(s): LIPASE, AMYLASE in the last 168 hours. No results for input(s): AMMONIA in the last 168 hours. Coagulation profile No results for input(s): INR, PROTIME in the last 168 hours. COVID-19 Labs  Recent Labs    11/23/18 0200 11/24/18 0305 11/24/18 0500 11/25/18 0246  DDIMER 1.02*  --  0.58* 0.66*  FERRITIN 1,715* 1,083*  --  760*  LDH 395*  --  420* 357*  CRP 5.0* 2.1*  --  1.0*    Lab Results  Component Value Date   SARSCOV2NAA Detected (A) 11/17/2018    CBC: Recent Labs  Lab 11/21/18 0452 11/22/18 0237 11/23/18 0200 11/24/18 0500 11/25/18 0246  WBC 5.8 6.5 11.2* 9.3 10.5  NEUTROABS 5.3 5.2 9.7* 8.0* 8.9*  HGB 14.5 14.5 14.5 13.5 13.3  HCT 44.3 45.0 45.8 40.5 40.0  MCV 94.1 94.1 95.0 93.1 92.2  PLT 187 264 301 288 279   Cardiac Enzymes: No results for input(s): CKTOTAL, CKMB, CKMBINDEX, TROPONINI in the last 168 hours. BNP (last 3 results) No results for input(s): PROBNP in the last 8760 hours. CBG: No results for input(s): GLUCAP in the last 168 hours. D-Dimer: Recent Labs    11/24/18 0500 11/25/18 0246  DDIMER 0.58* 0.66*   Hgb A1c: No results for input(s): HGBA1C in the last 72 hours. Lipid Profile: No results for input(s): CHOL, HDL, LDLCALC, TRIG, CHOLHDL, LDLDIRECT in the last 72 hours. Thyroid function studies: No results for input(s): TSH, T4TOTAL, T3FREE, THYROIDAB in the last 72 hours.  Invalid input(s): FREET3 Anemia work up: Recent Labs    11/24/18 0305 11/25/18 0246  FERRITIN 1,083* 760*   Sepsis Labs: Recent Labs  Lab 11/20/18 2300 11/21/18 0452 11/22/18 0237 11/23/18 0200 11/24/18 0500 11/25/18 0246  PROCALCITON  --  <0.10  --   --   --   --   WBC 8.2 5.8 6.5 11.2* 9.3 10.5  LATICACIDVEN 1.5  --   --   --   --   --    Microbiology Recent Results (from the past 240 hour(s))  Novel Coronavirus, NAA ([facility])     Status: Abnormal   Collection Time: 11/17/18  1:17 PM   Specimen: Oropharyngeal(OP)  collection in vial transport medium   OROPHARYNGEA  TESTING  Result Value Ref Range Status   SARS-CoV-2, NAA Detected (A) Not Detected Final    Comment: This test was developed and its performance characteristics determined by [employer]. This test has not been FDA cleared or approved. This test has been authorized by FDA under an Emergency Use Authorization (EUA). This test is only authorized for the duration  of time the declaration that circumstances exist justifying the authorization of the emergency use of in vitro diagnostic tests for detection of SARS-CoV-2 virus and/or diagnosis of COVID-19 infection under section 564(b)(1) of the Act, 21 U.S.C. KA:123727), unless the authorization is terminated or revoked sooner. When diagnostic testing is negative, the possibility of a false negative result should be considered in the context of a patient's recent exposures and the presence of clinical signs and symptoms consistent with COVID-19. An individual without symptoms of COVID-19 and who is not shedding SARS-CoV-2 virus would expect to have a negative (not detected) result in this assay.      Medications:   . dexamethasone  6 mg Oral Daily  . enoxaparin (LOVENOX) injection  40 mg Subcutaneous Q24H  . potassium chloride  40 mEq Oral BID  . rosuvastatin  10 mg Oral Daily  . vitamin C  500 mg Oral Daily  . zinc sulfate  220 mg Oral Daily   Continuous Infusions: . remdesivir 100 mg in NS 250 mL 100 mg (11/25/18 1029)     LOS: 4 days   Bonnie Brady  [facility]  11/25/2018, 10:38 AM

## 2018-11-25 NOTE — Progress Notes (Signed)
Occupational Therapy Treatment Patient Details Name: Bonnie Brady MRN: [id number] DOB: [birth year] Today's Date: 11/25/2018    History of present illness 68 y.o. female with history of hyperlipidemia who had recently traveled to [place] to attend her daughter's funeral and came back 4 days PTA with complaints of generalized body ache fever chills nausea vomiting and diarrhea was diagnosed with COVID-19. Her husband is also hospitalized   OT comments  Pt ambulated @ 100 ft on 2L with SpO2 @ 93. O2 removed. Ambulated @ 100 ft with SpO2 desat briefly to 88 with quick rebound to 93. 14 DOE. Pt left in prone position on RA with SpO2 95. Educated pt on importance of proning. Pt verbalized understanding.   Follow Up Recommendations  No OT follow up;Supervision - Intermittent    Equipment Recommendations  3 in 1 bedside commode    Recommendations for Other Services      Precautions / Restrictions Precautions Precautions: Other (comment) Precaution Comments: monitor O2 sats       Mobility Bed Mobility Overal bed mobility: Independent                Transfers Overall transfer level: Modified independent                    Balance Overall balance assessment: No apparent balance deficits (not formally assessed)                                         ADL either performed or assessed with clinical judgement   ADL Overall ADL's : Needs assistance/impaired                                     Functional mobility during ADLs: Supervision/safety General ADL Comments: overall set up for ADL. Pt states she does not feel she needs to use a 3in1 as a shower seat. Will further assess and educate on energy conservation     Vision       Perception     Praxis      Cognition Arousal/Alertness: Awake/alert Behavior During Therapy: WFL for tasks assessed/performed Overall Cognitive Status: Within Functional Limits for tasks assessed                                           Exercises Other Exercises Other Exercises: Pt independently using IS and flutter valve   Shoulder Instructions       General Comments      Pertinent Vitals/ Pain       Pain Assessment: No/denies pain  Home Living                                          Prior Functioning/Environment              Frequency  Min 2X/week        Progress Toward Goals  OT Goals(current goals can now be found in the care plan section)  Progress towards OT goals: Progressing toward goals  Acute Rehab OT Goals Patient Stated Goal: to get better OT Goal Formulation: With patient Time For Goal  Achievement: 12/07/18 Potential to Achieve Goals: Good ADL Goals Pt Will Perform Lower Body Bathing: with modified independence;sit to/from stand Pt Will Perform Lower Body Dressing: with modified independence;sit to/from stand Additional ADL Goal #1: Pt will independently verbalize 3 energy conservation strategies Additional ADL Goal #2: Pt will complete ADL task on RA with SpO2 remaining above 90 using pursed lip breathing techniques  Plan Discharge plan remains appropriate    Co-evaluation                 AM-PAC OT "6 Clicks" Daily Activity     Outcome Measure   Help from another person eating meals?: None Help from another person taking care of personal grooming?: None Help from another person toileting, which includes using toliet, bedpan, or urinal?: A Little Help from another person bathing (including washing, rinsing, drying)?: A Little Help from another person to put on and taking off regular upper body clothing?: A Little Help from another person to put on and taking off regular lower body clothing?: A Little 6 Click Score: 20    End of Session Equipment Utilized During Treatment: Oxygen(2L)  OT Visit Diagnosis: Muscle weakness (generalized) (M62.81)   Activity Tolerance Patient tolerated treatment  well   Patient Left in bed;with call bell/phone within reach(prone)   Nurse Communication Mobility status;Other (comment)(O2 Sats)        Time: [id number] OT Time Calculation (min): 15 min  Charges: OT General Charges $OT Visit: 1 Visit OT Treatments $Self Care/Home Management : 8-22 mins  Maurie Boettcher, OT/L   Acute OT Clinical Specialist [place] Pager [phone] Office [phone]    [facility] 11/25/2018, 3:54 PM

## 2018-11-25 NOTE — Plan of Care (Signed)
  Problem: Education: Goal: Knowledge of General Education information will improve Description: Including pain rating scale, medication(s)/side effects and non-pharmacologic comfort measures Outcome: Progressing   Problem: Health Behavior/Discharge Planning: Goal: Ability to manage health-related needs will improve Outcome: Progressing   Problem: Clinical Measurements: Goal: Ability to maintain clinical measurements within normal limits will improve Outcome: Progressing Goal: Will remain free from infection Outcome: Progressing Goal: Diagnostic test results will improve Outcome: Progressing Goal: Respiratory complications will improve Outcome: Progressing   Problem: Coping: Goal: Level of anxiety will decrease Outcome: Progressing   Problem: Elimination: Goal: Will not experience complications related to bowel motility Outcome: Progressing   Problem: Pain Managment: Goal: General experience of comfort will improve Outcome: Progressing   Problem: Safety: Goal: Ability to remain free from injury will improve Outcome: Progressing   Problem: Skin Integrity: Goal: Risk for impaired skin integrity will decrease Outcome: Progressing

## 2018-11-25 NOTE — Progress Notes (Signed)
Spoke to Pt's spouse and daughter, updated on status.. Advised that pt will be here another day or 2 pending if we are able to wean her from oxygen.  Advised them that we will get her up and walking in halls today to see how she does without oxygen and with oxygen.

## 2018-11-26 LAB — LACTATE DEHYDROGENASE: LDH: 374 U/L — ABNORMAL HIGH (ref 98–192)

## 2018-11-26 MED ORDER — DEXAMETHASONE 6 MG PO TABS: 3.0000 mg | ORAL_TABLET | Freq: Every day | ORAL | 0 refills | Status: AC

## 2018-11-26 NOTE — Plan of Care (Signed)
Will continue with plan of care. 

## 2018-11-26 NOTE — Discharge Instructions (Signed)
COVID-19: How to Protect Yourself and Others Know how it spreads  There is currently no vaccine to prevent coronavirus disease 2019 (COVID-19).  The best way to prevent illness is to avoid being exposed to this virus.  The virus is thought to spread mainly from person-to-person. ? Between people who are in close contact with one another (within about 6 feet). ? Through respiratory droplets produced when an infected person coughs, sneezes or talks. ? These droplets can land in the mouths or noses of people who are nearby or possibly be inhaled into the lungs. ? Some recent studies have suggested that COVID-19 may be spread by people who are not showing symptoms. Everyone should Clean your hands often  Wash your hands often with soap and water for at least 20 seconds especially after you have been in a public place, or after blowing your nose, coughing, or sneezing.  If soap and water are not readily available, use a hand sanitizer that contains at least 60% alcohol. Cover all surfaces of your hands and rub them together until they feel dry.  Avoid touching your eyes, nose, and mouth with unwashed hands. Avoid close contact  Stay home if you are sick.  Avoid close contact with people who are sick.  Put distance between yourself and other people. ? Remember that some people without symptoms may be able to spread virus. ? This is especially important for people who are at higher risk of getting very GainPain.com.cy Cover your mouth and nose with a cloth face cover when around others  You could spread COVID-19 to others even if you do not feel sick.  Everyone should wear a cloth face cover when they have to go out in public, for example to the grocery store or to pick up other necessities. ? Cloth face coverings should not be placed on young children under age 46, anyone who has trouble breathing, or is unconscious,  incapacitated or otherwise unable to remove the mask without assistance.  The cloth face cover is meant to protect other people in case you are infected.  Do NOT use a facemask meant for a Dietitian.  Continue to keep about 6 feet between yourself and others. The cloth face cover is not a substitute for social distancing. Cover coughs and sneezes  If you are in a private setting and do not have on your cloth face covering, remember to always cover your mouth and nose with a tissue when you cough or sneeze or use the inside of your elbow.  Throw used tissues in the trash.  Immediately wash your hands with soap and water for at least 20 seconds. If soap and water are not readily available, clean your hands with a hand sanitizer that contains at least 60% alcohol. Clean and disinfect  Clean AND disinfect frequently touched surfaces daily. This includes tables, doorknobs, light switches, countertops, handles, desks, phones, keyboards, toilets, faucets, and sinks. RackRewards.fr  If surfaces are dirty, clean them: Use detergent or soap and water prior to disinfection.  Then, use a household disinfectant. You can see a list of EPA-registered household disinfectants here. [email or web address].com 08/02/2018 This information is not intended to replace advice given to you by your health care provider. Make sure you discuss any questions you have with your health care provider. Document Released: 07/12/2018 Document Revised: 08/10/2018 Document Reviewed: 07/12/2018 Elsevier Patient Education  2020 [employer].   COVID-19 Frequently Asked Questions COVID-19 (coronavirus disease) is an infection that is caused by a large  family of viruses. Some viruses cause illness in people and others cause illness in animals like camels, cats, and bats. In some cases, the viruses that cause illness in animals can spread to humans. Where did  the coronavirus come from? In December 2019, [place] told the [facility]) of several cases of lung disease (human respiratory illness). These cases were linked to an open seafood and livestock market in the city of [place]. The link to the seafood and livestock market suggests that the virus may have spread from animals to humans. However, since that first outbreak in December, the virus has also been shown to spread from person to person. What is the name of the disease and the virus? Disease name Early on, this disease was called novel coronavirus. This is because scientists determined that the disease was caused by a new (novel) respiratory virus. The World Health Organization [facility]) has now named the disease COVID-19, or coronavirus disease. Virus name The virus that causes the disease is called severe acute respiratory syndrome coronavirus 2 (SARS-CoV-2). More information on disease and virus naming World Health Organization [facility]): www.who.int/emergencies/diseases/novel-coronavirus-2019/technical-guidance/naming-the-coronavirus-disease-(covid-2019)-and-the-virus-that-causes-it Who is at risk for complications from coronavirus disease? Some people may be at higher risk for complications from coronavirus disease. This includes older adults and people who have chronic diseases, such as heart disease, diabetes, and lung disease. If you are at higher risk for complications, take these extra precautions:  Avoid close contact with people who are sick or have a fever or cough. Stay at least 3-6 ft (1-2 m) away from them, if possible.  Wash your hands often with soap and water for at least 20 seconds.  Avoid touching your face, mouth, nose, or eyes.  Keep supplies on hand at home, such as food, medicine, and cleaning supplies.  Stay home as much as possible.  Avoid social gatherings and travel. How does coronavirus disease spread? The virus that causes coronavirus disease spreads  easily from person to person (is contagious). There are also cases of community-spread disease. This means the disease has spread to:  People who have no known contact with other infected people.  People who have not traveled to areas where there are known cases. It appears to spread from one person to another through droplets from coughing or sneezing. Can I get the virus from touching surfaces or objects? There is still a lot that we do not know about the virus that causes coronavirus disease. Scientists are basing a lot of information on what they know about similar viruses, such as:  Viruses cannot generally survive on surfaces for long. They need a human body (host) to survive.  It is more likely that the virus is spread by close contact with people who are sick (direct contact), such as through: ? Shaking hands or hugging. ? Breathing in respiratory droplets that travel through the air. This can happen when an infected person coughs or sneezes on or near other people.  It is less likely that the virus is spread when a person touches a surface or object that has the virus on it (indirect contact). The virus may be able to enter the body if the person touches a surface or object and then touches his or her face, eyes, nose, or mouth. Can a person spread the virus without having symptoms of the disease? It may be possible for the virus to spread before a person has symptoms of the disease, but this is most likely not the main way the  virus is spreading. It is more likely for the virus to spread by being in close contact with people who are sick and breathing in the respiratory droplets of a sick person's cough or sneeze. What are the symptoms of coronavirus disease? Symptoms vary from person to person and can range from mild to severe. Symptoms may include:  Fever.  Cough.  Tiredness, weakness, or fatigue.  Fast breathing or feeling short of breath. These symptoms can appear anywhere  from 2 to 14 days after you have been exposed to the virus. If you develop symptoms, call your health care provider. People with severe symptoms may need hospital care. If I am exposed to the virus, how long does it take before symptoms start? Symptoms of coronavirus disease may appear anywhere from 2 to 14 days after a person has been exposed to the virus. If you develop symptoms, call your health care provider. Should I be tested for this virus? Your health care provider will decide whether to test you based on your symptoms, history of exposure, and your risk factors. How does a health care provider test for this virus? Health care providers will collect samples to send for testing. Samples may include:  Taking a swab of fluid from the nose.  Taking fluid from the lungs by having you cough up mucus (sputum) into a sterile cup.  Taking a blood sample.  Taking a stool or urine sample. Is there a treatment or vaccine for this virus? Currently, there is no vaccine to prevent coronavirus disease. Also, there are no medicines like antibiotics or antivirals to treat the virus. A person who becomes sick is given supportive care, which means rest and fluids. A person may also relieve his or her symptoms by using over-the-counter medicines that treat sneezing, coughing, and runny nose. These are the same medicines that a person takes for the common cold. If you develop symptoms, call your health care provider. People with severe symptoms may need hospital care. What can I do to protect myself and my family from this virus?     You can protect yourself and your family by taking the same actions that you would take to prevent the spread of other viruses. Take the following actions:  Wash your hands often with soap and water for at least 20 seconds. If soap and water are not available, use alcohol-based hand sanitizer.  Avoid touching your face, mouth, nose, or eyes.  Cough or sneeze into a tissue,  sleeve, or elbow. Do not cough or sneeze into your hand or the air. ? If you cough or sneeze into a tissue, throw it away immediately and wash your hands.  Disinfect objects and surfaces that you frequently touch every day.  Avoid close contact with people who are sick or have a fever or cough. Stay at least 3-6 ft (1-2 m) away from them, if possible.  Stay home if you are sick, except to get medical care. Call your health care provider before you get medical care.  Make sure your vaccines are up to date. Ask your health care provider what vaccines you need. What should I do if I need to travel? Follow travel recommendations from your local health authority, the CDC, and WHO. Travel information and advice  Centers for Disease Control and Prevention (CDC): BodyEditor.hu  World Health Organization [facility]): ThirdIncome.ca Know the risks and take action to protect your health  You are at higher risk of getting coronavirus disease if you are traveling to  areas with an outbreak or if you are exposed to travelers from areas with an outbreak.  Wash your hands often and practice good hygiene to lower the risk of catching or spreading the virus. What should I do if I am sick? General instructions to stop the spread of infection  Wash your hands often with soap and water for at least 20 seconds. If soap and water are not available, use alcohol-based hand sanitizer.  Cough or sneeze into a tissue, sleeve, or elbow. Do not cough or sneeze into your hand or the air.  If you cough or sneeze into a tissue, throw it away immediately and wash your hands.  Stay home unless you must get medical care. Call your health care provider or local health authority before you get medical care.  Avoid public areas. Do not take public transportation, if possible.  If you can, wear a mask if you must go out of the house  or if you are in close contact with someone who is not sick. Keep your home clean  Disinfect objects and surfaces that are frequently touched every day. This may include: ? Counters and tables. ? Doorknobs and light switches. ? Sinks and faucets. ? Electronics such as phones, remote controls, keyboards, computers, and tablets.  Wash dishes in hot, soapy water or use a dishwasher. Air-dry your dishes.  Wash laundry in hot water. Prevent infecting other household members  Let healthy household members care for children and pets, if possible. If you have to care for children or pets, wash your hands often and wear a mask.  Sleep in a different bedroom or bed, if possible.  Do not share personal items, such as razors, toothbrushes, deodorant, combs, brushes, towels, and washcloths. Where to find more information Centers for Disease Control and Prevention (CDC)  Information and news updates: https://www.butler-gonzalez.com/ World Health Organization [facility])  Information and news updates: MissExecutive.com.ee  Coronavirus health topic: https://www.castaneda.info/  Questions and answers on COVID-19: OpportunityDebt.at  Global tracker: who.sprinklr.com American Academy of Pediatrics (AAP)  Information for families: www.healthychildren.org/English/health-issues/conditions/chest-lungs/Pages/2019-Novel-Coronavirus.aspx The coronavirus situation is changing rapidly. Check your local health authority website or the CDC and [facility] websites for updates and news. When should I contact a health care provider?  Contact your health care provider if you have symptoms of an infection, such as fever or cough, and you: ? Have been near anyone who is known to have coronavirus disease. ? Have come into contact with a person who is suspected to have coronavirus disease. ? Have traveled outside of the country. When should I get  emergency medical care?  Get help right away by calling your local emergency services (911 in the [place] if you have: ? Trouble breathing. ? Pain or pressure in your chest. ? Confusion. ? Blue-tinged lips and fingernails. ? Difficulty waking from sleep. ? Symptoms that get worse. Let the emergency medical personnel know if you think you have coronavirus disease. Summary  A new respiratory virus is spreading from person to person and causing COVID-19 (coronavirus disease).  The virus that causes COVID-19 appears to spread easily. It spreads from one person to another through droplets from coughing or sneezing.  Older adults and those with chronic diseases are at higher risk of disease. If you are at higher risk for complications, take extra precautions.  There is currently no vaccine to prevent coronavirus disease. There are no medicines, such as antibiotics or antivirals, to treat the virus.  You can protect yourself and your family by  washing your hands often, avoiding touching your face, and covering your coughs and sneezes. This information is not intended to replace advice given to you by your health care provider. Make sure you discuss any questions you have with your health care provider. Document Released: 07/12/2018 Document Revised: 07/12/2018 Document Reviewed: 07/12/2018 Elsevier Patient Education  2020 [employer].   COVID-19 COVID-19 is a respiratory infection that is caused by a virus called severe acute respiratory syndrome coronavirus 2 (SARS-CoV-2). The disease is also known as coronavirus disease or novel coronavirus. In some people, the virus may not cause any symptoms. In others, it may cause a serious infection. The infection can get worse quickly and can lead to complications, such as:  Pneumonia, or infection of the lungs.  Acute respiratory distress syndrome or ARDS. This is fluid build-up in the lungs.  Acute respiratory failure. This is a condition in which  there is not enough oxygen passing from the lungs to the body.  Sepsis or septic shock. This is a serious bodily reaction to an infection.  Blood clotting problems.  Secondary infections due to bacteria or fungus. The virus that causes COVID-19 is contagious. This means that it can spread from person to person through droplets from coughs and sneezes (respiratory secretions). What are the causes? This illness is caused by a virus. You may catch the virus by:  Breathing in droplets from an infected person's cough or sneeze.  Touching something, like a table or a doorknob, that was exposed to the virus (contaminated) and then touching your mouth, nose, or eyes. What increases the risk? Risk for infection You are more likely to be infected with this virus if you:  Live in or travel to an area with a COVID-19 outbreak.  Come in contact with a sick person who recently traveled to an area with a COVID-19 outbreak.  Provide care for or live with a person who is infected with COVID-19. Risk for serious illness You are more likely to become seriously ill from the virus if you:  Are 18 years of age or older.  Have a long-term disease that lowers your body's ability to fight infection (immunocompromised).  Live in a nursing home or long-term care facility.  Have a long-term (chronic) disease such as: ? Chronic lung disease, including chronic obstructive pulmonary disease or asthma ? Heart disease. ? Diabetes. ? Chronic kidney disease. ? Liver disease.  Are obese. What are the signs or symptoms? Symptoms of this condition can range from mild to severe. Symptoms may appear any time from 2 to 14 days after being exposed to the virus. They include:  A fever.  A cough.  Difficulty breathing.  Chills.  Muscle pains.  A sore throat.  Loss of taste or smell. Some people may also have stomach problems, such as nausea, vomiting, or diarrhea. Other people may not have any symptoms  of COVID-19. How is this diagnosed? This condition may be diagnosed based on:  Your signs and symptoms, especially if: ? You live in an area with a COVID-19 outbreak. ? You recently traveled to or from an area where the virus is common. ? You provide care for or live with a person who was diagnosed with COVID-19.  A physical exam.  Lab tests, which may include: ? A nasal swab to take a sample of fluid from your nose. ? A throat swab to take a sample of fluid from your throat. ? A sample of mucus from your lungs (sputum). ?  Blood tests. °· Imaging tests, which may include, X-rays, CT scan, or ultrasound. °How is this treated? °At present, there is no medicine to treat COVID-19. Medicines that treat other diseases are being used on a trial basis to see if they are effective against COVID-19. °Your health care provider will talk with you about ways to treat your symptoms. For most people, the infection is mild and can be managed at home with rest, fluids, and over-the-counter medicines. °Treatment for a serious infection usually takes places in a hospital intensive care unit (ICU). It may include one or more of the following treatments. These treatments are given until your symptoms improve. °· Receiving fluids and medicines through an IV. °· Supplemental oxygen. Extra oxygen is given through a tube in the nose, a face mask, or a hood. °· Positioning you to lie on your stomach (prone position). This makes it easier for oxygen to get into the lungs. °· Continuous positive airway pressure (CPAP) or bi-level positive airway pressure (BPAP) machine. This treatment uses mild air pressure to keep the airways open. A tube that is connected to a motor delivers oxygen to the body. °· Ventilator. This treatment moves air into and out of the lungs by using a tube that is placed in your windpipe. °· Tracheostomy. This is a procedure to create a hole in the neck so that a breathing tube can be  inserted. °· Extracorporeal membrane oxygenation (ECMO). This procedure gives the lungs a chance to recover by taking over the functions of the heart and lungs. It supplies oxygen to the body and removes carbon dioxide. °Follow these instructions at home: °Lifestyle °· If you are sick, stay home except to get medical care. Your health care provider will tell you how long to stay home. Call your health care provider before you go for medical care. °· Rest at home as told by your health care provider. °· Do not use any products that contain nicotine or tobacco, such as cigarettes, e-cigarettes, and chewing tobacco. If you need help quitting, ask your health care provider. °· Return to your normal activities as told by your health care provider. Ask your health care provider what activities are safe for you. °General instructions °· Take over-the-counter and prescription medicines only as told by your health care provider. °· Drink enough fluid to keep your urine pale yellow. °· Keep all follow-up visits as told by your health care provider. This is important. °How is this prevented? ° °There is no vaccine to help prevent COVID-19 infection. However, there are steps you can take to protect yourself and others from this virus. °To protect yourself:  °· Do not travel to areas where COVID-19 is a risk. The areas where COVID-19 is reported change often. To identify high-risk areas and travel restrictions, check the CDC travel website: wwwnc.cdc.gov/travel/notices °· If you live in, or must travel to, an area where COVID-19 is a risk, take precautions to avoid infection. °? Stay away from people who are sick. °? Wash your hands often with soap and water for 20 seconds. If soap and water are not available, use an alcohol-based hand sanitizer. °? Avoid touching your mouth, face, eyes, or nose. °? Avoid going out in public, follow guidance from your state and local health authorities. °? If you must go out in public, wear a  cloth face covering or face mask. °? Disinfect objects and surfaces that are frequently touched every day. This may include: °§ Counters and tables. °§ Doorknobs and   light switches.  Sinks and faucets.  Electronics, such as phones, remote controls, keyboards, computers, and tablets. To protect others: If you have symptoms of COVID-19, take steps to prevent the virus from spreading to others.  If you think you have a COVID-19 infection, contact your health care provider right away. Tell your health care team that you think you may have a COVID-19 infection.  Stay home. Leave your house only to seek medical care. Do not use public transport.  Do not travel while you are sick.  Wash your hands often with soap and water for 20 seconds. If soap and water are not available, use alcohol-based hand sanitizer.  Stay away from other members of your household. Let healthy household members care for children and pets, if possible. If you have to care for children or pets, wash your hands often and wear a mask. If possible, stay in your own room, separate from others. Use a different bathroom.  Make sure that all people in your household wash their hands well and often.  Cough or sneeze into a tissue or your sleeve or elbow. Do not cough or sneeze into your hand or into the air.  Wear a cloth face covering or face mask. Where to find more information  Centers for Disease Control and Prevention: PurpleGadgets.be  World Health Organization: https://www.castaneda.info/ Contact a health care provider if:  You live in or have traveled to an area where COVID-19 is a risk and you have symptoms of the infection.  You have had contact with someone who has COVID-19 and you have symptoms of the infection. Get help right away if:  You have trouble breathing.  You have pain or pressure in your chest.  You have confusion.  You have bluish lips and  fingernails.  You have difficulty waking from sleep.  You have symptoms that get worse. These symptoms may represent a serious problem that is an emergency. Do not wait to see if the symptoms will go away. Get medical help right away. Call your local emergency services (911 in the [place] Do not drive yourself to the hospital. Let the emergency medical personnel know if you think you have COVID-19. Summary  COVID-19 is a respiratory infection that is caused by a virus. It is also known as coronavirus disease or novel coronavirus. It can cause serious infections, such as pneumonia, acute respiratory distress syndrome, acute respiratory failure, or sepsis.  The virus that causes COVID-19 is contagious. This means that it can spread from person to person through droplets from coughs and sneezes.  You are more likely to develop a serious illness if you are 43 years of age or older, have a weak immunity, live in a nursing home, or have chronic disease.  There is no medicine to treat COVID-19. Your health care provider will talk with you about ways to treat your symptoms.  Take steps to protect yourself and others from infection. Wash your hands often and disinfect objects and surfaces that are frequently touched every day. Stay away from people who are sick and wear a mask if you are sick. This information is not intended to replace advice given to you by your health care provider. Make sure you discuss any questions you have with your health care provider. Document Released: 04/21/2018 Document Revised: 08/11/2018 Document Reviewed: 04/21/2018 Elsevier Patient Education  2020 [employer].

## 2018-11-26 NOTE — Discharge Summary (Signed)
Physician Discharge Summary  Bonnie Brady [id number] DOB: 07/05/1950 DOA: 11/20/2018  PCP: [name], NP  Admit date: 11/20/2018 Discharge date: 11/26/2018  Admitted From: Home Disposition:  Home  Recommendations for Outpatient Follow-up:  1. Follow up with PCP in 1-2 weeks 2. Please obtain BMP/CBC in one week   Home Health:No Equipment/Devices:None  Discharge Condition:Stable CODE STATUS:Full Diet recommendation: Heart Healthy   Brief/Interim Summary:  68 y.o. female past medical history of hyperlipidemia who recently traveled to [place] to attend a funeral came back 4 days prior to admission complaining of generalized body aches nausea vomiting and diarrhea was diagnosed with COVID-19.  She progressively got worse with family check her pulse ox and she was found to be hypoxic, in the ED she was placed on 6 L nasal cannula with improvement in her saturations  Discharge Diagnoses:  Principal Problem:   Acute respiratory failure with hypoxia ([place]) Active Problems:   Hyperlipidemia   Acute respiratory disease due to COVID-19 virus Acute respiratory failure with hypoxia due to acute COVID-19 viral infection: On admission she was satting in the low 70s, she was placed on nasal cannula oxygen supplementation with improvement in her saturations. She was started on IV Remdesivir and steroids, she completed her course in-house. Desaturating she did slowly improve until she was be weaned to room air she ambulated without oxygen and her saturations remained greater than 90%.  Hyperlipidemia: Continue statins.  Transaminitis: Likely due to COVID-19 viral infections resolved with conservative management.   Discharge Instructions  Discharge Instructions    Diet - low sodium heart healthy   Complete by: As directed    Increase activity slowly   Complete by: As directed      Allergies as of 11/26/2018      Reactions   Ampicillin Hives   Questionable. Had a rash   Latex Other (See Comments)   Contact with powder.   Tetanus Toxoids Other (See Comments)   Swelling / redness   Nickel Rash   Blisters      Medication List    TAKE these medications   benzonatate 100 MG capsule Commonly known as: TESSALON Take 1 capsule (100 mg total) by mouth 3 (three) times daily as needed.   dexamethasone 6 MG tablet Commonly known as: DECADRON Take 0.5 tablets (3 mg total) by mouth daily for 2 days.   ondansetron 4 MG disintegrating tablet Commonly known as: ZOFRAN-ODT Take 1 tablet (4 mg total) by mouth every 8 (eight) hours as needed for nausea or vomiting.   rosuvastatin 10 MG tablet Commonly known as: CRESTOR TAKE 1 TABLET BY MOUTH EVERY DAY       Allergies  Allergen Reactions  . Ampicillin Hives    Questionable. Had a rash  . Latex Other (See Comments)    Contact with powder.  . Tetanus Toxoids Other (See Comments)    Swelling / redness  . Nickel Rash    Blisters    Consultations:  None   Procedures/Studies: Dg Chest Portable 1 View  Result Date: 11/20/2018 CLINICAL DATA:  Shortness of breath EXAM: PORTABLE CHEST 1 VIEW COMPARISON:  None. FINDINGS: Heart is borderline in size. Patchy bibasilar airspace opacities. No effusions or acute bony abnormality. IMPRESSION: Patchy bibasilar atelectasis or infiltrates. Electronically Signed   By: Rolm Baptise M.D.   On: 11/20/2018 23:25    Subjective: No complains  Discharge Exam: Vitals:   11/26/18 0435 11/26/18 0850  BP: 123/74 105/66  Pulse:  79  Resp:  20  Temp: 98 F (36.7 C) 98 F (36.7 C)  SpO2:  93%   Vitals:   11/25/18 1257 11/25/18 1950 11/26/18 0435 11/26/18 0850  BP: 131/71 132/77 123/74 105/66  Pulse:    79  Resp: 20   20  Temp: (!) 97.5 F (36.4 C) 97.7 F (36.5 C) 98 F (36.7 C) 98 F (36.7 C)  TempSrc: Oral Oral Oral Oral  SpO2: 94%   93%  Weight:      Height:        General: Pt is alert, awake, not in acute distress Cardiovascular: RRR, S1/S2 +, no  rubs, no gallops Respiratory: CTA bilaterally, no wheezing, no rhonchi Abdominal: Soft, NT, ND, bowel sounds + Extremities: no edema, no cyanosis    The results of significant diagnostics from this hospitalization (including imaging, microbiology, ancillary and laboratory) are listed below for reference.     Microbiology: Recent Results (from the past 240 hour(s))  Novel Coronavirus, NAA ([facility])     Status: Abnormal   Collection Time: 11/17/18  1:17 PM   Specimen: Oropharyngeal(OP) collection in vial transport medium   OROPHARYNGEA  TESTING  Result Value Ref Range Status   SARS-CoV-2, NAA Detected (A) Not Detected Final    Comment: This test was developed and its performance characteristics determined by [employer]. This test has not been FDA cleared or approved. This test has been authorized by FDA under an Emergency Use Authorization (EUA). This test is only authorized for the duration of time the declaration that circumstances exist justifying the authorization of the emergency use of in vitro diagnostic tests for detection of SARS-CoV-2 virus and/or diagnosis of COVID-19 infection under section 564(b)(1) of the Act, 21 U.S.C. [id number]), unless the authorization is terminated or revoked sooner. When diagnostic testing is negative, the possibility of a false negative result should be considered in the context of a patient's recent exposures and the presence of clinical signs and symptoms consistent with COVID-19. An individual without symptoms of COVID-19 and who is not shedding SARS-CoV-2 virus would expect to have a negative (not detected) result in this assay.      Labs: BNP (last 3 results) Recent Labs    11/23/18 0200 11/24/18 0305 11/25/18 0246  BNP 88.2 75.8 [id number]   Basic Metabolic Panel: Recent Labs  Lab 11/21/18 0452 11/22/18 0237 11/23/18 0200 11/24/18 0500 11/25/18 0246  NA 137 138 139 138 140  K 3.6 3.8 3.9 4.1 3.7  CL 103 104 104  105 105  CO2 23 24 24 22 23   GLUCOSE 143* 131* 149* 162* 154*  BUN 11 19 24* 22 21  CREATININE 0.98 0.72 0.76 0.70 0.75  CALCIUM 8.4* 8.5* 8.7* 8.4* 8.5*  MG  --  2.6* 2.3 2.3 2.2   Liver Function Tests: Recent Labs  Lab 11/21/18 0452 11/22/18 0237 11/23/18 0200 11/24/18 0500 11/25/18 0246  AST 35 45* 29 25 20   ALT 22 29 29 26 27   ALKPHOS 43 45 46 46 43  BILITOT 0.8 0.4 0.5 0.2* 0.5  PROT 5.7* 6.2* 6.1* 5.2* 5.1*  ALBUMIN 2.5* 2.7* 2.9* 2.6* 2.6*   No results for input(s): LIPASE, AMYLASE in the last 168 hours. No results for input(s): AMMONIA in the last 168 hours. CBC: Recent Labs  Lab 11/21/18 0452 11/22/18 0237 11/23/18 0200 11/24/18 0500 11/25/18 0246  WBC 5.8 6.5 11.2* 9.3 10.5  NEUTROABS 5.3 5.2 9.7* 8.0* 8.9*  HGB 14.5 14.5 14.5 13.5 13.3  HCT 44.3 45.0 45.8 40.5 40.0  MCV  94.1 94.1 95.0 93.1 92.2  PLT 187 264 301 288 279   Cardiac Enzymes: No results for input(s): CKTOTAL, CKMB, CKMBINDEX, TROPONINI in the last 168 hours. BNP: Invalid input(s): POCBNP CBG: No results for input(s): GLUCAP in the last 168 hours. D-Dimer Recent Labs    11/24/18 0500 11/25/18 0246  DDIMER 0.58* 0.66*   Hgb A1c No results for input(s): HGBA1C in the last 72 hours. Lipid Profile No results for input(s): CHOL, HDL, LDLCALC, TRIG, CHOLHDL, LDLDIRECT in the last 72 hours. Thyroid function studies No results for input(s): TSH, T4TOTAL, T3FREE, THYROIDAB in the last 72 hours.  Invalid input(s): FREET3 Anemia work up Recent Labs    11/24/18 0305 11/25/18 0246  FERRITIN 1,083* 760*   Urinalysis    Component Value Date/Time   COLORURINE YELLOW 11/21/2018 0134   APPEARANCEUR HAZY (A) 11/21/2018 0134   LABSPEC 1.015 11/21/2018 0134   PHURINE 6.0 11/21/2018 0134   GLUCOSEU NEGATIVE 11/21/2018 0134   GLUCOSEU NEGATIVE 09/24/2015 1052   HGBUR NEGATIVE 11/21/2018 0134   BILIRUBINUR NEGATIVE 11/21/2018 0134   BILIRUBINUR Negative 02/16/2018 1349   KETONESUR 20 (A)  11/21/2018 0134   PROTEINUR 100 (A) 11/21/2018 0134   UROBILINOGEN negative (A) 02/16/2018 1349   UROBILINOGEN 0.2 09/24/2015 1052   NITRITE NEGATIVE 11/21/2018 0134   LEUKOCYTESUR NEGATIVE 11/21/2018 0134   Sepsis Labs Invalid input(s): PROCALCITONIN,  WBC,  LACTICIDVEN Microbiology Recent Results (from the past 240 hour(s))  Novel Coronavirus, NAA ([facility])     Status: Abnormal   Collection Time: 11/17/18  1:17 PM   Specimen: Oropharyngeal(OP) collection in vial transport medium   OROPHARYNGEA  TESTING  Result Value Ref Range Status   SARS-CoV-2, NAA Detected (A) Not Detected Final    Comment: This test was developed and its performance characteristics determined by [employer]. This test has not been FDA cleared or approved. This test has been authorized by FDA under an Emergency Use Authorization (EUA). This test is only authorized for the duration of time the declaration that circumstances exist justifying the authorization of the emergency use of in vitro diagnostic tests for detection of SARS-CoV-2 virus and/or diagnosis of COVID-19 infection under section 564(b)(1) of the Act, 21 U.S.C. [id number]), unless the authorization is terminated or revoked sooner. When diagnostic testing is negative, the possibility of a false negative result should be considered in the context of a patient's recent exposures and the presence of clinical signs and symptoms consistent with COVID-19. An individual without symptoms of COVID-19 and who is not shedding SARS-CoV-2 virus would expect to have a negative (not detected) result in this assay.      Time coordinating discharge: Over 40 minutes  SIGNED:   Charlynne Cousins, MD  [facility] 11/26/2018, 9:20 AM Pager   If 7PM-7AM, please contact night-coverage [email or web address] Password [email or web address]

## 2018-11-26 NOTE — Progress Notes (Signed)
Discharge instructions given and patient verbalized understanding. Decadron prescription given. No acute distress noted.

## 2018-11-26 NOTE — Progress Notes (Signed)
Occupational Therapy Treatment Patient Details Name: Bonnie Brady MRN: [id number] DOB: [birth year] Today's Date: 11/26/2018    History of present illness 68 y.o. female with history of hyperlipidemia who had recently traveled to [place] to attend her daughter's funeral and came back 4 days PTA with complaints of generalized body ache fever chills nausea vomiting and diarrhea was diagnosed with COVID-19. Her husband is also hospitalized   OT comments  Pt progressing toward established OT goals and performing ADLs at supervision level. Providing education and handout on [facility] for ADLs/IADLs; pt verbalized understanding. Pt performing functional mobility in hallway to simulate home distance. SpO2 >87% on RA. Continue to recommend dc to home once medically stable per physician. Will continue to follow acutely as admitted.    Follow Up Recommendations  No OT follow up;Supervision - Intermittent    Equipment Recommendations  None recommended by OT    Recommendations for Other Services      Precautions / Restrictions Precautions Precautions: Other (comment) Precaution Comments: monitor O2 sats       Mobility Bed Mobility Overal bed mobility: Independent                Transfers Overall transfer level: Modified independent                    Balance Overall balance assessment: No apparent balance deficits (not formally assessed)                                         ADL either performed or assessed with clinical judgement   ADL Overall ADL's : Needs assistance/impaired                                     Functional mobility during ADLs: Supervision/safety General ADL Comments: Performing ADLs at supervision level. Discussed use of 3N1 as shower seat, pt reporting she doesn't feel she will need it at home. Pt performing functional mobility in hallway to simulate home distance. Provided education on [facility] for ADLs and IADLs; pt verablized  understanding     Vision       Perception     Praxis      Cognition Arousal/Alertness: Awake/alert Behavior During Therapy: WFL for tasks assessed/performed Overall Cognitive Status: Within Functional Limits for tasks assessed                                          Exercises Exercises: Other exercises Other Exercises Other Exercises: Educating pt on general BUE/BLE exercises; pt verablized understanding   Shoulder Instructions       General Comments SpO2 >87% on RA    Pertinent Vitals/ Pain       Pain Assessment: No/denies pain  Home Living                                          Prior Functioning/Environment              Frequency  Min 2X/week        Progress Toward Goals  OT Goals(current goals can now be found in the care plan  section)  Progress towards OT goals: Progressing toward goals  Acute Rehab OT Goals Patient Stated Goal: to get better OT Goal Formulation: With patient Time For Goal Achievement: 12/07/18 Potential to Achieve Goals: Good ADL Goals Pt Will Perform Lower Body Bathing: with modified independence;sit to/from stand Pt Will Perform Lower Body Dressing: with modified independence;sit to/from stand Additional ADL Goal #1: Pt will independently verbalize 3 energy conservation strategies Additional ADL Goal #2: Pt will complete ADL task on RA with SpO2 remaining above 90 using pursed lip breathing techniques  Plan Discharge plan remains appropriate    Co-evaluation                 AM-PAC OT "6 Clicks" Daily Activity     Outcome Measure   Help from another person eating meals?: None Help from another person taking care of personal grooming?: None Help from another person toileting, which includes using toliet, bedpan, or urinal?: A Little Help from another person bathing (including washing, rinsing, drying)?: A Little Help from another person to put on and taking off regular upper  body clothing?: A Little Help from another person to put on and taking off regular lower body clothing?: A Little 6 Click Score: 20    End of Session    OT Visit Diagnosis: Muscle weakness (generalized) (M62.81)   Activity Tolerance Patient tolerated treatment well   Patient Left in bed;with call bell/phone within reach(EOB)   Nurse Communication Mobility status        Time: [id number] OT Time Calculation (min): 26 min  Charges: OT General Charges $OT Visit: 1 Visit OT Treatments $Self Care/Home Management : 8-22 mins $Therapeutic Activity: 8-22 mins  Kori Goins MSOT, OTR/L Acute Rehab Pager: [phone] Office: [place] 11/26/2018, 4:02 PM

## 2018-11-26 NOTE — Care Management (Signed)
Case manager contacted  patient via bedside RN to confirm if she had a 3in1, patient says she doesn't but she doesn't want one. No further CM needs.     Ricki Miller, RN BSN Case Manager [phone]

## 2018-11-27 ENCOUNTER — Other Ambulatory Visit: Payer: Self-pay | Admitting: Family

## 2018-11-28 ENCOUNTER — Telehealth: Payer: Self-pay | Admitting: Family

## 2018-11-28 NOTE — Telephone Encounter (Signed)
Please call pt to arrange TCM follow up.  (virtual visit)

## 2018-11-28 NOTE — Telephone Encounter (Signed)
LVM fo pt to call office to set up appt

## 2018-11-29 NOTE — Telephone Encounter (Signed)
Pt has been scheduled.  °

## 2018-11-29 NOTE — Telephone Encounter (Signed)
2nd attempt LVM

## 2018-11-30 ENCOUNTER — Other Ambulatory Visit: Payer: Self-pay

## 2018-11-30 ENCOUNTER — Ambulatory Visit (INDEPENDENT_AMBULATORY_CARE_PROVIDER_SITE_OTHER): Payer: Medicare Other | Admitting: Family

## 2018-11-30 VITALS — HR 86

## 2018-11-30 DIAGNOSIS — U071 COVID-19: Secondary | ICD-10-CM | POA: Diagnosis not present

## 2018-11-30 NOTE — Progress Notes (Signed)
Virtual Visit via Video Note  I connected with Bonnie Brady on 11/30/18 at 12:00 PM EDT by a video enabled telemedicine application and verified that I am speaking with the correct person using two identifiers.  Location: Patient: home Provider: home   I discussed the limitations of evaluation and management by telemedicine and the availability of in person appointments. The patient expressed understanding and agreed to proceed.  History of Present Illness:  Patient is a 68 yr old female who presents today for hospital follow up. Discharge summary is reviewed.  Pt was admitted 8/23-8/29 with hypoxia in the setting of COVID-19 infection. She unfortunately contracted COVID-19 while attending her daughter's funeral in [place].    Hospital discharge summary is reviewed.   Upon admission her oxygen saturation was in the low 70's. CXR showed patchy bibasilar atelectasis or infiltrates.  She was placed on oxygen via nasal cannula, Remdesivir and steroids.  She was ultimately weaned off of oxygen and was discharged home.   Overall, she reports that she has been doing well since she returned home.  She reports that her energy has been very low but today is a little better than yesterday.  Denies SOB.   Denies recent fever. Reports mild dry cough.  Reports pulse ox has been stable on room air at 92%  Past Medical History:  Diagnosis Date  . Hyperlipidemia   . Kidney stone   . Tachycardia   . Varicose veins      Social History   Socioeconomic History  . Marital status: Married    Spouse name: Not on file  . Number of children: Not on file  . Years of education: Not on file  . Highest education level: Not on file  Occupational History  . Not on file  Social Needs  . Financial resource strain: Not on file  . Food insecurity    Worry: Not on file    Inability: Not on file  . Transportation needs    Medical: Not on file    Non-medical: Not on file  Tobacco Use  . Smoking status:  Former Smoker    Types: Cigarettes    Quit date: 03/30/2008    Years since quitting: 10.6  . Smokeless tobacco: Never Used  Substance and Sexual Activity  . Alcohol use: Yes    Comment: occasional < 1 a week  . Drug use: No  . Sexual activity: Not on file  Lifestyle  . Physical activity    Days per week: Not on file    Minutes per session: Not on file  . Stress: Not on file  Relationships  . Social Herbalist on phone: Not on file    Gets together: Not on file    Attends religious service: Not on file    Active member of club or organization: Not on file    Attends meetings of clubs or organizations: Not on file    Relationship status: Not on file  . Intimate partner violence    Fear of current or ex partner: Not on file    Emotionally abused: Not on file    Physically abused: Not on file    Forced sexual activity: Not on file  Other Topics Concern  . Not on file  Social History Narrative   Has worked as Engineering geologist- stopped working 2011.    Married   2 children   Enjoys Airline pilot  Past Surgical History:  Procedure Laterality Date  . ABDOMINAL HYSTERECTOMY    . APPENDECTOMY  1971  . bladder strap  2006  . [place]  . laser of veins  2010   also in 2016 and 2017  . TONSILLECTOMY  1958    Family History  Problem Relation Age of Onset  . Breast cancer Paternal Grandmother   . Stroke Mother   . Stroke Maternal Grandfather   . COPD Father   . COPD Brother   . Breast cancer Daughter     Allergies  Allergen Reactions  . Ampicillin Hives    Questionable. Had a rash  . Latex Other (See Comments)    Contact with powder.  . Tetanus Toxoids Other (See Comments)    Swelling / redness  . Nickel Rash    Blisters    Current Outpatient Medications on File Prior to Visit  Medication Sig Dispense Refill  . rosuvastatin (CRESTOR) 10 MG tablet TAKE 1 TABLET BY MOUTH EVERY DAY 90 tablet 1    No current facility-administered medications on file prior to visit.     There were no vitals taken for this visit.    Observations/Objective:   Gen: Awake, alert, no acute distress Resp: Breathing is even and non-labored Psych: calm/pleasant demeanor Neuro: Alert and Oriented x 3, + facial symmetry, speech is clear.   Assessment and Plan:  COVID-19 infection- clinically improving. Recommended follow up lab work and chest x-ray in 2-3 weeks.    Follow Up Instructions:    I discussed the assessment and treatment plan with the patient. The patient was provided an opportunity to ask questions and all were answered. The patient agreed with the plan and demonstrated an understanding of the instructions.   The patient was advised to call back or seek an in-person evaluation if the symptoms worsen or if the condition fails to improve as anticipated.  Nance Pear, NP

## 2018-12-02 ENCOUNTER — Encounter: Payer: Medicare Other | Admitting: Family

## 2018-12-12 ENCOUNTER — Other Ambulatory Visit: Payer: Self-pay

## 2018-12-22 ENCOUNTER — Ambulatory Visit (HOSPITAL_BASED_OUTPATIENT_CLINIC_OR_DEPARTMENT_OTHER)
Admission: RE | Admit: 2018-12-22 | Discharge: 2018-12-22 | Disposition: A | Discharge status: Home / Self Care | Payer: Medicare Other | Source: Ambulatory Visit | Attending: Family | Admitting: Family

## 2018-12-22 ENCOUNTER — Other Ambulatory Visit (INDEPENDENT_AMBULATORY_CARE_PROVIDER_SITE_OTHER): Payer: Medicare Other

## 2018-12-22 ENCOUNTER — Other Ambulatory Visit: Payer: Self-pay

## 2018-12-22 DIAGNOSIS — R05 Cough: Secondary | ICD-10-CM | POA: Diagnosis not present

## 2018-12-22 DIAGNOSIS — U071 COVID-19: Secondary | ICD-10-CM | POA: Insufficient documentation

## 2018-12-22 LAB — CBC WITH DIFFERENTIAL/PLATELET
Basophils Absolute: 0 10*3/uL (ref 0.0–0.1)
Basophils Relative: 0.4 % (ref 0.0–3.0)
Eosinophils Absolute: 0.2 10*3/uL (ref 0.0–0.7)
Eosinophils Relative: 2.5 % (ref 0.0–5.0)
HCT: 40.3 % (ref 36.0–46.0)
Hemoglobin: 13.3 g/dL (ref 12.0–15.0)
Lymphocytes Relative: 38.1 % (ref 12.0–46.0)
Lymphs Abs: 2.5 10*3/uL (ref 0.7–4.0)
MCHC: 33 g/dL (ref 30.0–36.0)
MCV: 94.1 fl (ref 78.0–100.0)
Monocytes Absolute: 0.8 10*3/uL (ref 0.1–1.0)
Monocytes Relative: 11.5 % (ref 3.0–12.0)
Neutro Abs: 3.2 10*3/uL (ref 1.4–7.7)
Neutrophils Relative %: 47.5 % (ref 43.0–77.0)
Platelets: 301 10*3/uL (ref 150.0–400.0)
RBC: 4.29 Mil/uL (ref 3.87–5.11)
RDW: 15.4 % (ref 11.5–15.5)
WBC: 6.7 10*3/uL (ref 4.0–10.5)

## 2018-12-22 LAB — COMPREHENSIVE METABOLIC PANEL
ALT: 25 U/L (ref 0–35)
AST: 19 U/L (ref 0–37)
Albumin: 4 g/dL (ref 3.5–5.2)
Alkaline Phosphatase: 63 U/L (ref 39–117)
BUN: 12 mg/dL (ref 6–23)
CO2: 28 mEq/L (ref 19–32)
Calcium: 9.7 mg/dL (ref 8.4–10.5)
Chloride: 106 mEq/L (ref 96–112)
Creatinine, Ser: 0.84 mg/dL (ref 0.40–1.20)
GFR: 67.49 mL/min (ref >60.00)
Glucose, Bld: 89 mg/dL (ref 70–99)
Potassium: 3.7 mEq/L (ref 3.5–5.1)
Sodium: 142 mEq/L (ref 135–145)
Total Bilirubin: 0.4 mg/dL (ref 0.2–1.2)
Total Protein: 6.2 g/dL (ref 6.0–8.3)

## 2019-01-12 DIAGNOSIS — R3121 Asymptomatic microscopic hematuria: Secondary | ICD-10-CM | POA: Diagnosis not present

## 2019-01-12 DIAGNOSIS — N905 Atrophy of vulva: Secondary | ICD-10-CM | POA: Diagnosis not present

## 2019-01-16 DIAGNOSIS — Z23 Encounter for immunization: Secondary | ICD-10-CM | POA: Diagnosis not present

## 2019-01-20 DIAGNOSIS — Z803 Family history of malignant neoplasm of breast: Secondary | ICD-10-CM | POA: Diagnosis not present

## 2019-01-20 DIAGNOSIS — Z1231 Encounter for screening mammogram for malignant neoplasm of breast: Secondary | ICD-10-CM | POA: Diagnosis not present

## 2019-02-01 ENCOUNTER — Telehealth: Payer: Self-pay

## 2019-02-01 DIAGNOSIS — Z0000 Encounter for general adult medical examination without abnormal findings: Secondary | ICD-10-CM

## 2019-02-01 DIAGNOSIS — D729 Disorder of white blood cells, unspecified: Secondary | ICD-10-CM

## 2019-02-01 NOTE — Telephone Encounter (Signed)
  Patient reports she has been loosing "a lot of hair and is requesting a Thyroid panel and any other labs she can get prior to her appointment on 02-07-2019.  Please advise.

## 2019-02-02 NOTE — Telephone Encounter (Signed)
Lvm for patient to be aware orders are in her chart and to give [place] a call back to set up a lab time that is convenient for her.

## 2019-02-02 NOTE — Telephone Encounter (Signed)
Future orders have been placed. She can schedule a lab appointment if desired prior to her appointment.

## 2019-02-03 NOTE — Telephone Encounter (Signed)
Lvm again today for patient to be aware she can still call to have labs on Monday prior to her visit on Tuesday.

## 2019-02-07 ENCOUNTER — Other Ambulatory Visit: Payer: Self-pay

## 2019-02-07 ENCOUNTER — Encounter: Payer: Medicare Other | Admitting: Family

## 2019-02-07 ENCOUNTER — Other Ambulatory Visit (INDEPENDENT_AMBULATORY_CARE_PROVIDER_SITE_OTHER): Payer: Medicare Other

## 2019-02-07 DIAGNOSIS — D729 Disorder of white blood cells, unspecified: Secondary | ICD-10-CM | POA: Diagnosis not present

## 2019-02-07 DIAGNOSIS — L659 Nonscarring hair loss, unspecified: Secondary | ICD-10-CM

## 2019-02-07 DIAGNOSIS — Z0000 Encounter for general adult medical examination without abnormal findings: Secondary | ICD-10-CM

## 2019-02-08 ENCOUNTER — Encounter: Payer: Self-pay | Admitting: Family

## 2019-02-08 LAB — CBC WITH DIFFERENTIAL/PLATELET
Basophils Absolute: 0.1 K/uL (ref 0.0–0.1)
Basophils Relative: 0.8 % (ref 0.0–3.0)
Eosinophils Absolute: 0.1 K/uL (ref 0.0–0.7)
Eosinophils Relative: 1.1 % (ref 0.0–5.0)
HCT: 42.5 % (ref 36.0–46.0)
Hemoglobin: 14 g/dL (ref 12.0–15.0)
Lymphocytes Relative: 33.3 % (ref 12.0–46.0)
Lymphs Abs: 2.6 K/uL (ref 0.7–4.0)
MCHC: 33 g/dL (ref 30.0–36.0)
MCV: 96 fl (ref 78.0–100.0)
Monocytes Absolute: 0.5 K/uL (ref 0.1–1.0)
Monocytes Relative: 6.8 % (ref 3.0–12.0)
Neutro Abs: 4.5 K/uL (ref 1.4–7.7)
Neutrophils Relative %: 58 % (ref 43.0–77.0)
Platelets: 217 K/uL (ref 150.0–400.0)
RBC: 4.43 Mil/uL (ref 3.87–5.11)
RDW: 14.8 % (ref 11.5–15.5)
WBC: 7.8 K/uL (ref 4.0–10.5)

## 2019-02-08 LAB — HEPATIC FUNCTION PANEL
ALT: 27 U/L (ref 0–35)
AST: 21 U/L (ref 0–37)
Albumin: 4.4 g/dL (ref 3.5–5.2)
Alkaline Phosphatase: 75 U/L (ref 39–117)
Bilirubin, Direct: 0.1 mg/dL (ref 0.0–0.3)
Total Bilirubin: 0.4 mg/dL (ref 0.2–1.2)
Total Protein: 6.4 g/dL (ref 6.0–8.3)

## 2019-02-08 LAB — BASIC METABOLIC PANEL WITH GFR
BUN: 18 mg/dL (ref 6–23)
CO2: 26 meq/L (ref 19–32)
Calcium: 9.4 mg/dL (ref 8.4–10.5)
Chloride: 106 meq/L (ref 96–112)
Creatinine, Ser: 0.94 mg/dL (ref 0.40–1.20)
GFR: 59.25 mL/min — ABNORMAL LOW
Glucose, Bld: 110 mg/dL — ABNORMAL HIGH (ref 70–99)
Potassium: 3.8 meq/L (ref 3.5–5.1)
Sodium: 140 meq/L (ref 135–145)

## 2019-02-08 LAB — TSH: TSH: 2.27 u[IU]/mL (ref 0.35–4.50)

## 2019-02-08 LAB — LIPID PANEL
Cholesterol: 177 mg/dL (ref 0–200)
HDL: 44.3 mg/dL (ref >39.00)
NonHDL: 132.87
Total CHOL/HDL Ratio: 4
Triglycerides: 316 mg/dL — ABNORMAL HIGH (ref 0.0–149.0)
VLDL: 63.2 mg/dL — ABNORMAL HIGH (ref 0.0–40.0)

## 2019-02-08 LAB — LDL CHOLESTEROL, DIRECT: Direct LDL: 93 mg/dL

## 2019-02-09 ENCOUNTER — Encounter: Payer: Self-pay | Admitting: Family

## 2019-02-09 DIAGNOSIS — L659 Nonscarring hair loss, unspecified: Secondary | ICD-10-CM

## 2019-02-16 DIAGNOSIS — L650 Telogen effluvium: Secondary | ICD-10-CM | POA: Diagnosis not present

## 2019-02-16 DIAGNOSIS — Z23 Encounter for immunization: Secondary | ICD-10-CM | POA: Diagnosis not present

## 2019-04-04 ENCOUNTER — Encounter: Payer: Medicare Other | Admitting: Family

## 2019-04-11 ENCOUNTER — Other Ambulatory Visit: Payer: Self-pay

## 2019-04-12 ENCOUNTER — Encounter: Payer: Self-pay | Admitting: Family

## 2019-04-12 ENCOUNTER — Other Ambulatory Visit: Payer: Self-pay

## 2019-04-12 ENCOUNTER — Telehealth: Payer: Self-pay | Admitting: Family

## 2019-04-12 ENCOUNTER — Ambulatory Visit (INDEPENDENT_AMBULATORY_CARE_PROVIDER_SITE_OTHER): Payer: Medicare Other | Admitting: Family

## 2019-04-12 VITALS — BP 141/69 | HR 78 | Temp 97.4°F | Resp 16 | Ht 66.0 in | Wt 175.0 lb

## 2019-04-12 DIAGNOSIS — R739 Hyperglycemia, unspecified: Secondary | ICD-10-CM

## 2019-04-12 DIAGNOSIS — R7303 Prediabetes: Secondary | ICD-10-CM | POA: Insufficient documentation

## 2019-04-12 DIAGNOSIS — Z0000 Encounter for general adult medical examination without abnormal findings: Secondary | ICD-10-CM

## 2019-04-12 LAB — HEMOGLOBIN A1C: Hgb A1c MFr Bld: 6.3 % (ref 4.6–6.5)

## 2019-04-12 MED ORDER — SHINGRIX 50 MCG/0.5ML IM SUSR: INTRAMUSCULAR | 1 refills | Status: DC

## 2019-04-12 NOTE — Patient Instructions (Signed)
Please complete lab work prior to leaving. °Continue your work on healthy diet, exercise, weight loss.  °

## 2019-04-12 NOTE — Telephone Encounter (Signed)
Please call [facility] to request copy of most recent mammogram.

## 2019-04-12 NOTE — Telephone Encounter (Signed)
Records release form faxed to [facility]

## 2019-04-12 NOTE — Progress Notes (Signed)
Subjective:    Patient ID: Bonnie Brady, female    DOB: [birth year], 70 y.o.   MRN: [id number]  HPI  Patient presents today for complete physical.  Immunizations: flu shot up to date, tetanus/pnx up to date, due for shingrix.  Diet: needs improvement Exercise: going to the gym Colonoscopy: 7/16 due 2026 Dexa:  6/19 Pap Smear: N/A Mammogram:  Done at [facility] Readings from Last 3 Encounters:  04/12/19 175 lb (79.4 kg)  11/21/18 172 lb 6.4 oz (78.2 kg)  02/16/18 180 lb (81.6 kg)   Saw urology in April and was told that microscopic hematuria is secondary to prolapsed urethra    Review of Systems  Constitutional: Negative for unexpected weight change.  HENT: Negative for hearing loss and rhinorrhea.   Eyes: Negative for visual disturbance.  Respiratory: Negative for cough and shortness of breath.   Cardiovascular: Negative for chest pain.  Gastrointestinal: Negative for constipation and diarrhea.  Genitourinary: Negative for dysuria, frequency and hematuria.  Musculoskeletal: Negative for arthralgias and myalgias.  Neurological: Negative for headaches.  Hematological: Negative for adenopathy.  Psychiatric/Behavioral:       Denies depression/anxiety   Past Medical History:  Diagnosis Date  . Hyperlipidemia   . Kidney stone   . Tachycardia   . Varicose veins      Social History   Socioeconomic History  . Marital status: Married    Spouse name: Not on file  . Number of children: Not on file  . Years of education: Not on file  . Highest education level: Not on file  Occupational History  . Not on file  Tobacco Use  . Smoking status: Former Smoker    Types: Cigarettes    Quit date: 03/30/2008    Years since quitting: 11.0  . Smokeless tobacco: Never Used  Substance and Sexual Activity  . Alcohol use: Yes    Comment: occasional < 1 a week  . Drug use: No  . Sexual activity: Not on file  Other Topics Concern  . Not on file  Social History Narrative   Has  worked as Engineering geologist- stopped working 2011.    Married   2 children   Enjoys Airline pilot      Social Determinants of Health   Financial Resource Strain:   . Difficulty of Paying Living Expenses: Not on file  Food Insecurity:   . Worried About Charity fundraiser in the Last Year: Not on file  . Ran Out of Food in the Last Year: Not on file  Transportation Needs:   . Lack of Transportation (Medical): Not on file  . Lack of Transportation (Non-Medical): Not on file  Physical Activity:   . Days of Exercise per Week: Not on file  . Minutes of Exercise per Session: Not on file  Stress:   . Feeling of Stress : Not on file  Social Connections:   . Frequency of Communication with Friends and Family: Not on file  . Frequency of Social Gatherings with Friends and Family: Not on file  . Attends Religious Services: Not on file  . Active Member of Clubs or Organizations: Not on file  . Attends Archivist Meetings: Not on file  . Marital Status: Not on file  Intimate Partner Violence:   . Fear of Current or Ex-Partner: Not on file  . Emotionally Abused: Not on file  . Physically Abused: Not on file  . Sexually Abused: Not on file  Past Surgical History:  Procedure Laterality Date  . ABDOMINAL HYSTERECTOMY    . APPENDECTOMY  1971  . bladder strap  2006  . [place]  . laser of veins  2010   also in 2016 and 2017  . TONSILLECTOMY  1958    Family History  Problem Relation Age of Onset  . Breast cancer Paternal Grandmother   . Stroke Mother   . Stroke Maternal Grandfather   . COPD Father   . COPD Brother   . Breast cancer Daughter     Allergies  Allergen Reactions  . Ampicillin Hives    Questionable. Had a rash  . Latex Other (See Comments)    Contact with powder.  . Tetanus Toxoids Other (See Comments)    Swelling / redness  . Nickel Rash    Blisters    Current Outpatient Medications on File  Prior to Visit  Medication Sig Dispense Refill  . rosuvastatin (CRESTOR) 10 MG tablet TAKE 1 TABLET BY MOUTH EVERY DAY 90 tablet 1   No current facility-administered medications on file prior to visit.    BP (!) 141/69 (BP Location: Right Arm, Patient Position: Sitting, Cuff Size: Small)   Pulse 78   Temp (!) 97.4 F (36.3 C) (Temporal)   Resp 16   Ht 5\' 6"  (1.676 m)   Wt 175 lb (79.4 kg)   SpO2 100%   BMI 28.25 kg/m       Objective:   Physical Exam  Physical Exam  Constitutional: She is oriented to person, place, and time. She appears well-developed and well-nourished. No distress.  HENT:  Head: Normocephalic and atraumatic.  Right Ear: Tympanic membrane and ear canal normal.  Left Ear: Tympanic membrane and ear canal normal.  Mouth/Throat: Not examined, pt wearing mask Eyes: Pupils are equal, round, and reactive to light. No scleral icterus.  Neck: Normal range of motion. No thyromegaly present.  Cardiovascular: Normal rate and regular rhythm.   No murmur heard. Pulmonary/Chest: Effort normal and breath sounds normal. No respiratory distress. He has no wheezes. She has no rales. She exhibits no tenderness.  Abdominal: Soft. Bowel sounds are normal. She exhibits no distension and no mass. There is no tenderness. There is no rebound and no guarding.  Musculoskeletal: She exhibits no edema.  Lymphadenopathy:    She has no cervical adenopathy.  Neurological: She is alert and oriented to person, place, and time. She has normal patellar reflexes. She exhibits normal muscle tone. Coordination normal.  Skin: Skin is warm and dry.  Psychiatric: She has a normal mood and affect. Her behavior is normal. Judgment and thought content normal.  Breast/pelvic: deferred       Assessment & Plan:  Preventative care- due for zostavax, rx sent to her pharmacy. Other immunizations reviewed and up to date. Colo and dexa up to date.  Will request copy of her mammogram  report.  Hyperglycemia/hypertriglyceridemia- noted on most recent blood draw. Will obtain a1C.  Discussed importance of exercise, weight loss and avoiding concentrated sweets, and limiting white carbs (rice/bread/pasta/potatoes). Instead substitute whole grain versions with reasonable portions.  This visit occurred during the SARS-CoV-2 public health emergency.  Safety protocols were in place, including screening questions prior to the visit, additional usage of staff PPE, and extensive cleaning of exam room while observing appropriate contact time as indicated for disinfecting solutions.         Assessment & Plan:

## 2019-05-26 ENCOUNTER — Other Ambulatory Visit: Payer: Self-pay | Admitting: Family

## 2019-06-09 ENCOUNTER — Telehealth: Payer: Self-pay | Admitting: Family

## 2019-06-09 NOTE — Telephone Encounter (Signed)
Patient states that we filled the wrong billing code for  Visit : 04/12/2019  Correct Code : [id number]  Annual Wellness   Code we used was for  Preventative Visit isn't covered

## 2019-06-12 NOTE — Telephone Encounter (Signed)
Hi,   I can send message and ask for adjustment due to scheduling error but not sure if patient requested cpe, should have been awv.  But current documentation does not support a [id number]. I could ask for a [id number].  Just not sure they will do for me.  He will still be due for AWV, so could have [place] call and schedule if he will do.  Let me know.   Thanks, DAwn

## 2019-06-12 NOTE — Telephone Encounter (Signed)
Could a AWV be billed or a 99214?

## 2019-06-16 NOTE — Telephone Encounter (Signed)
Whichever you think our path of least resistance wil be. Pt says she called to schedule a well check so Im torn.

## 2019-06-22 NOTE — Telephone Encounter (Signed)
At [facility] request, I did call the patient to let the patient know the charge for DOS 04/12/19 was voided and that with traditional Medicare plans, yearly physicals with primary care providers are not covered and the patient would need to schedule an Annual Wellness Visit with our Health Coach in the future.  Patient did not answer, but I left a detailed message on the voice mail stating all of this and that the patient could call me back with any further questions.

## 2019-07-10 ENCOUNTER — Telehealth: Payer: Self-pay | Admitting: Family

## 2019-07-10 NOTE — Telephone Encounter (Signed)
On 11/10 patient states her lab should have been covered. She states that the ICD10 Z00.00  Is the code that was used for  the Basic Metoblic panel Hepatic function panel.. PT has Medicare.   Please advise Patient was told to give about  7-10 days for follow up.

## 2019-07-12 DIAGNOSIS — B351 Tinea unguium: Secondary | ICD-10-CM | POA: Diagnosis not present

## 2019-08-02 DIAGNOSIS — D239 Other benign neoplasm of skin, unspecified: Secondary | ICD-10-CM | POA: Diagnosis not present

## 2019-08-02 DIAGNOSIS — L570 Actinic keratosis: Secondary | ICD-10-CM | POA: Diagnosis not present

## 2019-09-15 ENCOUNTER — Encounter: Payer: Self-pay | Admitting: Family

## 2019-09-15 ENCOUNTER — Ambulatory Visit (INDEPENDENT_AMBULATORY_CARE_PROVIDER_SITE_OTHER): Payer: Medicare Other | Admitting: Family

## 2019-09-15 ENCOUNTER — Other Ambulatory Visit: Payer: Self-pay

## 2019-09-15 VITALS — BP 132/81 | HR 72 | Temp 97.6°F | Resp 16 | Ht 66.0 in | Wt 175.0 lb

## 2019-09-15 DIAGNOSIS — R2241 Localized swelling, mass and lump, right lower limb: Secondary | ICD-10-CM

## 2019-09-15 NOTE — Progress Notes (Signed)
Subjective:    Patient ID: Bonnie Brady, female    DOB: [birth year], 69 y.o.   MRN: [id number]  HPI   Patient is a 69 yr old female who presents today with chief complaint of a "lump" on her right hip.  She states that she first noticed the mass about 1 month ago. Reports that the area is not tender.    Review of Systems See HPI  Past Medical History:  Diagnosis Date  . Borderline diabetes   . COVID-19 10/2018  . Hyperlipidemia   . Kidney stone   . Tachycardia   . Varicose veins      Social History   Socioeconomic History  . Marital status: Married    Spouse name: Not on file  . Number of children: Not on file  . Years of education: Not on file  . Highest education level: Not on file  Occupational History  . Not on file  Tobacco Use  . Smoking status: Former Smoker    Types: Cigarettes    Quit date: 03/30/2008    Years since quitting: 11.4  . Smokeless tobacco: Never Used  Vaping Use  . Vaping Use: Never assessed  Substance and Sexual Activity  . Alcohol use: Yes    Comment: occasional < 1 a week  . Drug use: No  . Sexual activity: Not on file  Other Topics Concern  . Not on file  Social History Narrative   Has worked as Engineering geologist- stopped working 2011.    Married   2 children   Enjoys Airline pilot      Social Determinants of Health   Financial Resource Strain:   . Difficulty of Paying Living Expenses:   Food Insecurity:   . Worried About Charity fundraiser in the Last Year:   . Arboriculturist in the Last Year:   Transportation Needs:   . Film/video editor (Medical):   Marland Kitchen Lack of Transportation (Non-Medical):   Physical Activity:   . Days of Exercise per Week:   . Minutes of Exercise per Session:   Stress:   . Feeling of Stress :   Social Connections:   . Frequency of Communication with Friends and Family:   . Frequency of Social Gatherings with Friends and Family:   . Attends Religious Services:    . Active Member of Clubs or Organizations:   . Attends Archivist Meetings:   Marland Kitchen Marital Status:   Intimate Partner Violence:   . Fear of Current or Ex-Partner:   . Emotionally Abused:   Marland Kitchen Physically Abused:   . Sexually Abused:     Past Surgical History:  Procedure Laterality Date  . ABDOMINAL HYSTERECTOMY    . APPENDECTOMY  1971  . bladder strap  2006  . [place]  . laser of veins  2010   also in 2016 and 2017  . TONSILLECTOMY  1958    Family History  Problem Relation Age of Onset  . Breast cancer Paternal Grandmother   . Stroke Mother   . Stroke Maternal Grandfather   . COPD Father   . COPD Brother   . Breast cancer Daughter     Allergies  Allergen Reactions  . Ampicillin Hives    Questionable. Had a rash  . Latex Other (See Comments)    Contact with powder.  . Tetanus Toxoids Other (See Comments)    Swelling / redness  .  Nickel Rash    Blisters    Current Outpatient Medications on File Prior to Visit  Medication Sig Dispense Refill  . Estradiol-Estriol-Progesterone (BIEST/PROGESTERONE) CREA Place onto the skin.    . rosuvastatin (CRESTOR) 10 MG tablet TAKE 1 TABLET BY MOUTH EVERY DAY 90 tablet 1  . Zoster Vaccine Adjuvanted [facility]) injection Inject 0.5mg  IM now and again in 2-6 months. 0.5 mL 1   No current facility-administered medications on file prior to visit.    BP 132/81 (BP Location: Right Arm, Patient Position: Sitting, Cuff Size: Small)   Pulse 72   Temp 97.6 F (36.4 C) (Temporal)   Resp 16   Ht 5\' 6"  (1.676 m)   Wt 175 lb (79.4 kg)   SpO2 98%   BMI 28.25 kg/m       Objective:   Physical Exam Constitutional:      Appearance: Normal appearance.  Pulmonary:     Effort: Pulmonary effort is normal.  Musculoskeletal:     Comments: Approximately 4 inch wide mass noted in the soft tissues overlying right hip. Mass is mobile, smooth and non-tender.  Skin:    General: Skin is warm and dry.   Neurological:     Mental Status: She is alert.           Assessment & Plan:  Right Hip Mass- New. will obtain US for further characterization. Suspect lipoma.    This visit occurred during the SARS-CoV-2 public health emergency.  Safety protocols were in place, including screening questions prior to the visit, additional usage of staff PPE, and extensive cleaning of exam room while observing appropriate contact time as indicated for disinfecting solutions.

## 2019-09-15 NOTE — Addendum Note (Signed)
Addended by: [name] on: 09/15/2019 12:47 PM   Modules accepted: Level of Service

## 2019-09-15 NOTE — Patient Instructions (Signed)
Please schedule your ultrasound on the first floor.

## 2019-09-19 ENCOUNTER — Ambulatory Visit (HOSPITAL_BASED_OUTPATIENT_CLINIC_OR_DEPARTMENT_OTHER): Payer: Medicare Other

## 2019-09-26 ENCOUNTER — Ambulatory Visit (HOSPITAL_BASED_OUTPATIENT_CLINIC_OR_DEPARTMENT_OTHER)
Admission: RE | Admit: 2019-09-26 | Discharge: 2019-09-26 | Disposition: A | Discharge status: Home / Self Care | Payer: Medicare Other | Source: Ambulatory Visit | Attending: Family | Admitting: Family

## 2019-09-26 ENCOUNTER — Other Ambulatory Visit: Payer: Self-pay

## 2019-09-26 DIAGNOSIS — R2241 Localized swelling, mass and lump, right lower limb: Secondary | ICD-10-CM | POA: Diagnosis not present

## 2019-09-26 DIAGNOSIS — R2242 Localized swelling, mass and lump, left lower limb: Secondary | ICD-10-CM | POA: Diagnosis not present

## 2019-09-27 ENCOUNTER — Telehealth: Payer: Self-pay | Admitting: Family

## 2019-09-27 NOTE — Telephone Encounter (Signed)
Reviewed [place] results with pt. She is unaware of any trauma to this area. I reviewed the images with Dr. Posey Pronto at [facility] imaging.  He feels that images most consistent with hematoma and recommends follow up clinical evaluation.  I discussed with pt and she is agreeable to this plan and will call our office for a follow up visit in 1 month.

## 2019-10-30 ENCOUNTER — Encounter: Payer: Self-pay | Admitting: Family

## 2019-10-30 DIAGNOSIS — M7989 Other specified soft tissue disorders: Secondary | ICD-10-CM

## 2019-11-11 ENCOUNTER — Other Ambulatory Visit: Payer: Self-pay

## 2019-11-11 ENCOUNTER — Ambulatory Visit (HOSPITAL_BASED_OUTPATIENT_CLINIC_OR_DEPARTMENT_OTHER)
Admission: RE | Admit: 2019-11-11 | Discharge: 2019-11-11 | Disposition: A | Discharge status: Home / Self Care | Payer: Medicare Other | Source: Ambulatory Visit | Attending: Family | Admitting: Family

## 2019-11-11 DIAGNOSIS — L989 Disorder of the skin and subcutaneous tissue, unspecified: Secondary | ICD-10-CM | POA: Diagnosis not present

## 2019-11-11 DIAGNOSIS — M7989 Other specified soft tissue disorders: Secondary | ICD-10-CM | POA: Diagnosis not present

## 2019-11-11 DIAGNOSIS — R2241 Localized swelling, mass and lump, right lower limb: Secondary | ICD-10-CM | POA: Diagnosis not present

## 2019-11-11 MED ORDER — GADOBUTROL 1 MMOL/ML IV SOLN
8.0000 mL | Freq: Once | INTRAVENOUS | Status: AC | PRN
  Administered 2019-11-11: 8 mL via INTRAVENOUS

## 2019-11-14 NOTE — Telephone Encounter (Signed)
PBO [id number] DOS 02/07/19 80061 [facility] denied as not deemed a medical necessity. Per profee email, dx was changed from Z00.00 to L65.9. [facility] has processed the correction and continues to deny for medical necessity.  Per 04/21/19 accnt notes, this has already been reviewed by coding. Adjusted.

## 2019-11-20 ENCOUNTER — Encounter: Payer: Self-pay | Admitting: Family

## 2019-11-20 DIAGNOSIS — R229 Localized swelling, mass and lump, unspecified: Secondary | ICD-10-CM

## 2019-11-21 ENCOUNTER — Encounter: Payer: Self-pay | Admitting: Family

## 2019-11-21 NOTE — Telephone Encounter (Signed)
I saw where the referral was placed but patient called their office and they told her that she is not in their system. She thought she was told she had an appointment. I think there is some confusion. Can you see if this referral has been authorized?

## 2019-12-20 DIAGNOSIS — L570 Actinic keratosis: Secondary | ICD-10-CM | POA: Diagnosis not present

## 2019-12-20 DIAGNOSIS — B351 Tinea unguium: Secondary | ICD-10-CM | POA: Diagnosis not present

## 2019-12-20 DIAGNOSIS — D239 Other benign neoplasm of skin, unspecified: Secondary | ICD-10-CM | POA: Diagnosis not present

## 2020-01-01 DIAGNOSIS — R2241 Localized swelling, mass and lump, right lower limb: Secondary | ICD-10-CM | POA: Diagnosis not present

## 2020-01-12 DIAGNOSIS — Z23 Encounter for immunization: Secondary | ICD-10-CM | POA: Diagnosis not present

## 2020-01-15 ENCOUNTER — Other Ambulatory Visit: Payer: Self-pay | Admitting: General Surgery

## 2020-01-19 ENCOUNTER — Other Ambulatory Visit: Payer: Self-pay | Admitting: Family

## 2020-02-05 ENCOUNTER — Telehealth: Payer: Self-pay | Admitting: Family

## 2020-02-05 DIAGNOSIS — Z780 Asymptomatic menopausal state: Secondary | ICD-10-CM

## 2020-02-05 DIAGNOSIS — E348 Other specified endocrine disorders: Secondary | ICD-10-CM

## 2020-02-05 NOTE — Telephone Encounter (Signed)
Patient need a bone density order sent to [facility] mammography. Her appointment is on 02/20/20

## 2020-02-06 NOTE — Addendum Note (Signed)
Addended by: [name] on: 02/06/2020 08:12 AM   Modules accepted: Orders

## 2020-02-09 ENCOUNTER — Other Ambulatory Visit (HOSPITAL_COMMUNITY): Payer: Medicare Other

## 2020-02-16 IMAGING — DX DG CHEST 2V
2 series · 2 of 2 positions shown · non-contrast
Comparison: 11/20/2018

CLINICAL DATA: Dry cough.

EXAM:
CHEST - 2 VIEW

[chest pa]
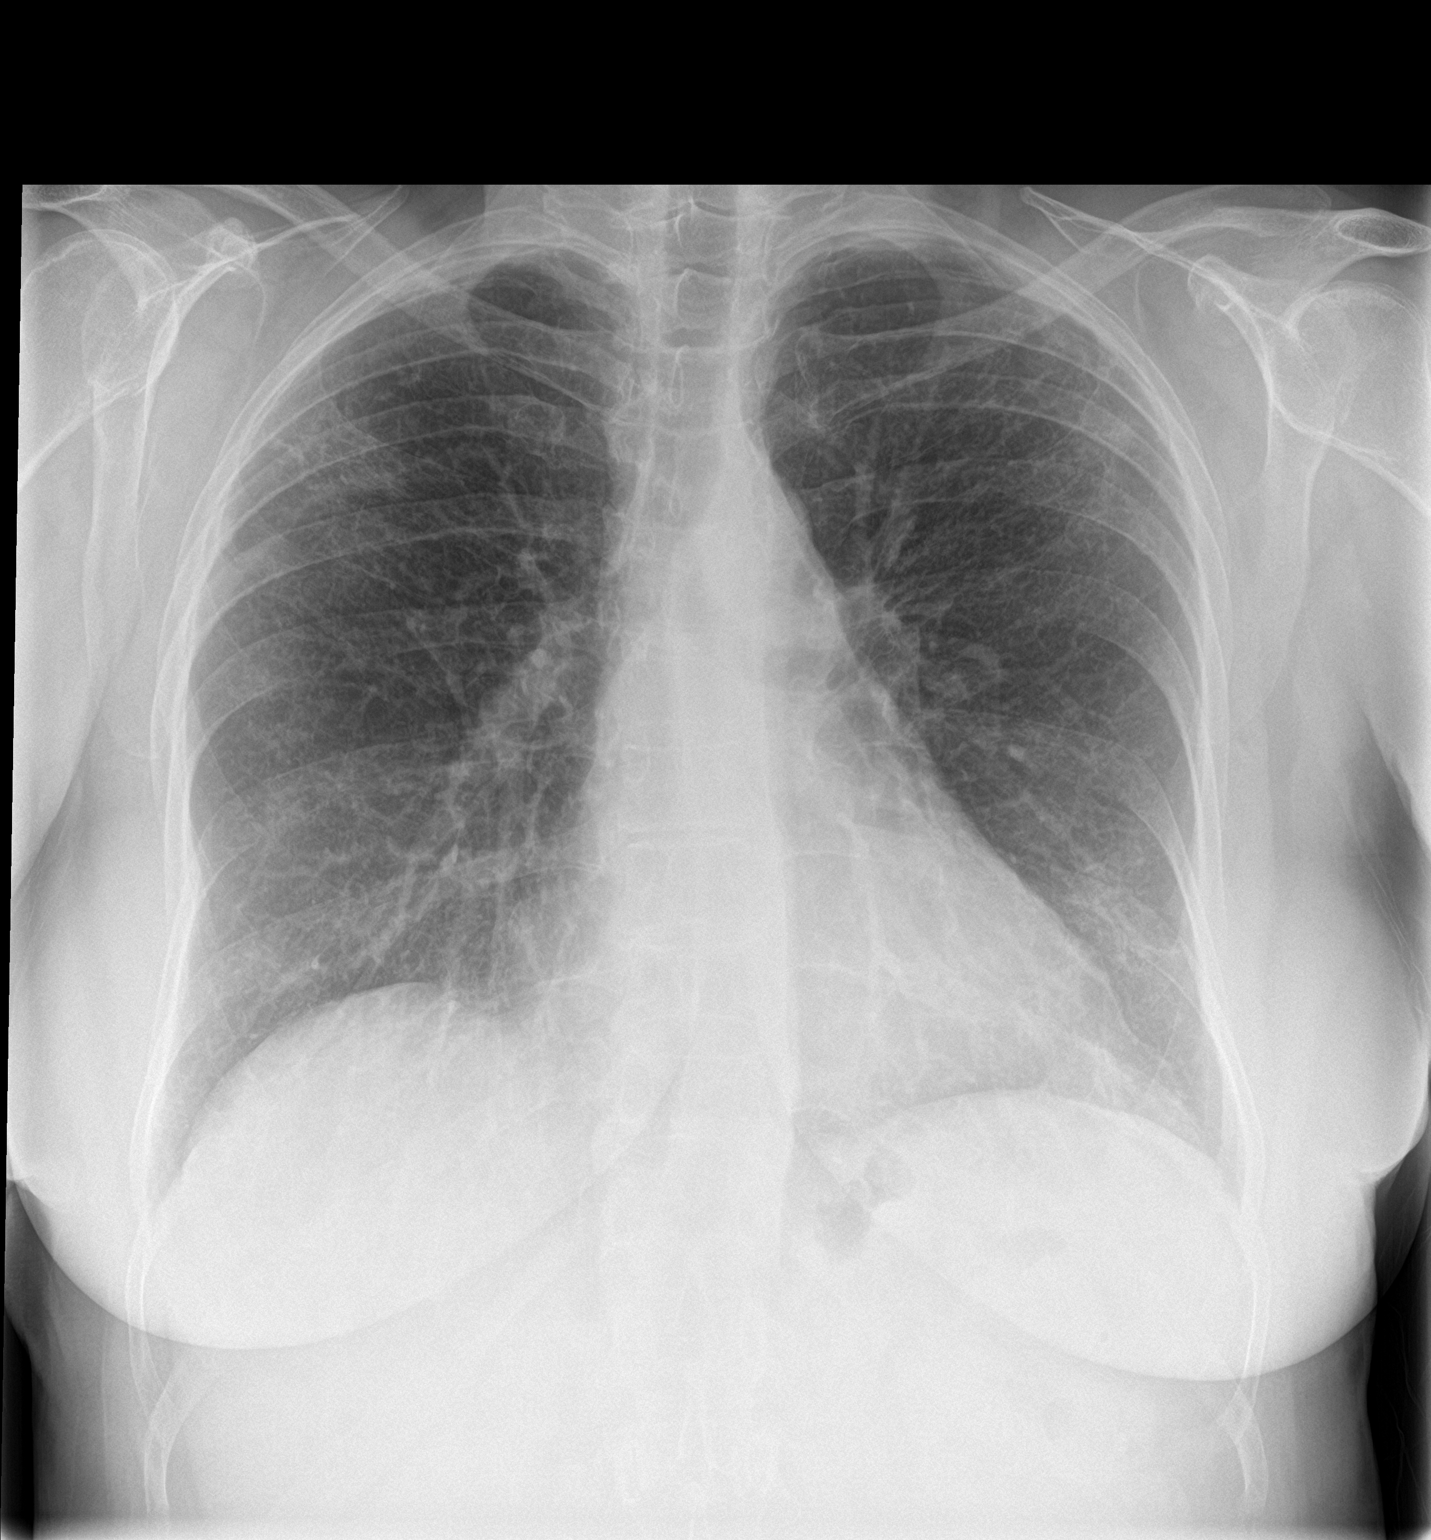

[chest lat]
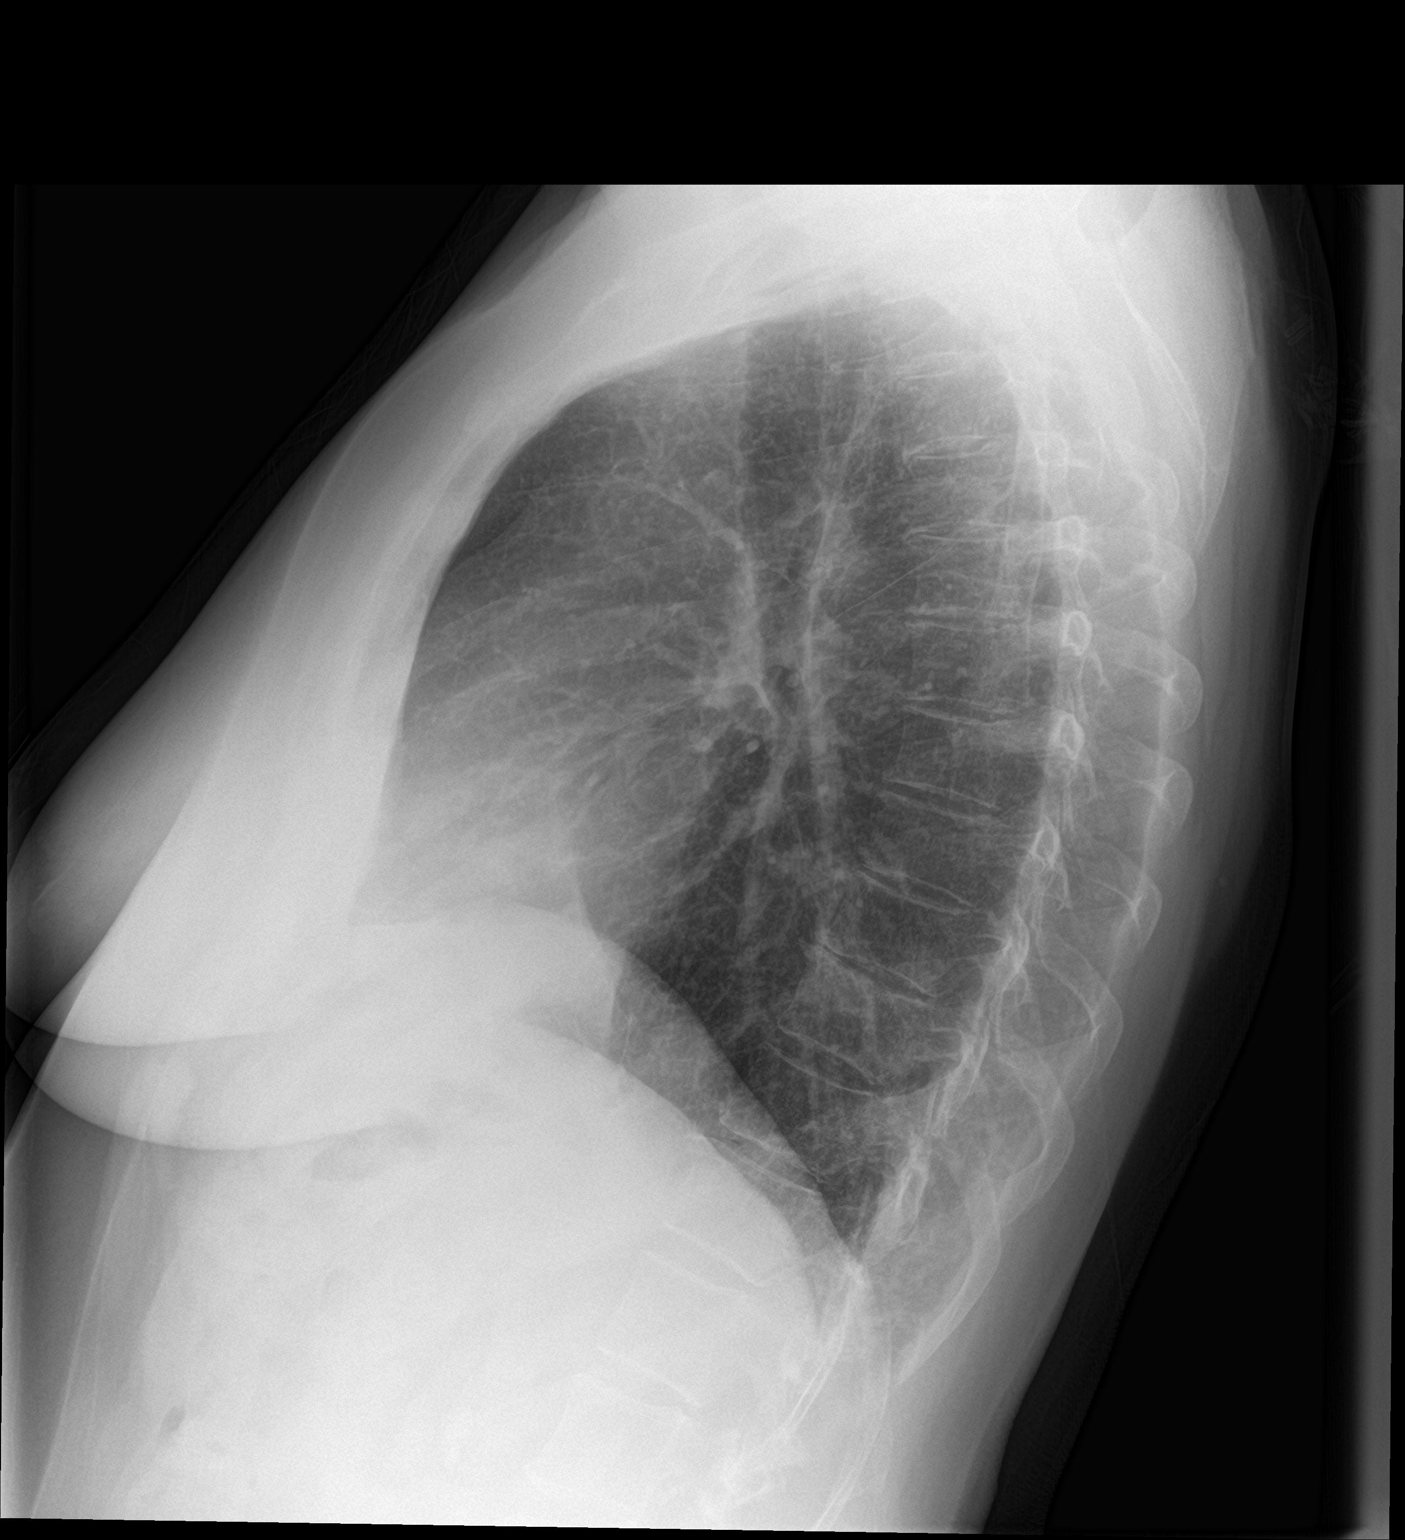

[2 of 2 positions shown; findings below may reference images not displayed]

FINDINGS: There is bilateral mild interstitial thickening. There is no focal
consolidation. There is no pleural effusion or pneumothorax. The
heart and mediastinal contours are unremarkable.

There is no acute osseous abnormality.
IMPRESSION: No active cardiopulmonary disease.

## 2020-02-20 ENCOUNTER — Encounter: Payer: Self-pay | Admitting: Family

## 2020-02-20 DIAGNOSIS — Z1231 Encounter for screening mammogram for malignant neoplasm of breast: Secondary | ICD-10-CM | POA: Diagnosis not present

## 2020-02-20 DIAGNOSIS — M8589 Other specified disorders of bone density and structure, multiple sites: Secondary | ICD-10-CM | POA: Diagnosis not present

## 2020-02-20 LAB — HM MAMMOGRAPHY

## 2020-03-07 ENCOUNTER — Encounter (HOSPITAL_BASED_OUTPATIENT_CLINIC_OR_DEPARTMENT_OTHER): Payer: Self-pay | Admitting: General Surgery

## 2020-03-07 ENCOUNTER — Other Ambulatory Visit: Payer: Self-pay

## 2020-03-09 ENCOUNTER — Other Ambulatory Visit (HOSPITAL_COMMUNITY)
Admission: RE | Admit: 2020-03-09 | Discharge: 2020-03-09 | Disposition: A | Discharge status: Home / Self Care | Payer: Medicare Other | Source: Ambulatory Visit | Attending: General Surgery | Admitting: General Surgery

## 2020-03-09 DIAGNOSIS — Z20822 Contact with and (suspected) exposure to covid-19: Secondary | ICD-10-CM | POA: Insufficient documentation

## 2020-03-09 DIAGNOSIS — Z01812 Encounter for preprocedural laboratory examination: Secondary | ICD-10-CM | POA: Insufficient documentation

## 2020-03-10 LAB — SARS CORONAVIRUS 2 (TAT 6-24 HRS): SARS Coronavirus 2: NEGATIVE

## 2020-03-11 ENCOUNTER — Telehealth: Payer: Self-pay | Admitting: Family

## 2020-03-11 ENCOUNTER — Encounter: Payer: Self-pay | Admitting: Family

## 2020-03-11 DIAGNOSIS — M8580 Other specified disorders of bone density and structure, unspecified site: Secondary | ICD-10-CM | POA: Insufficient documentation

## 2020-03-11 MED ORDER — ENSURE PRE-SURGERY PO LIQD: 296.0000 mL | Freq: Once | ORAL | Status: DC

## 2020-03-11 NOTE — Progress Notes (Signed)

## 2020-03-11 NOTE — Telephone Encounter (Signed)
See mychart.  

## 2020-03-13 ENCOUNTER — Ambulatory Visit (HOSPITAL_BASED_OUTPATIENT_CLINIC_OR_DEPARTMENT_OTHER): Payer: Medicare Other | Admitting: Anesthesiology

## 2020-03-13 ENCOUNTER — Encounter (HOSPITAL_BASED_OUTPATIENT_CLINIC_OR_DEPARTMENT_OTHER)
Admission: RE | Disposition: A | Discharge status: Home / Self Care | Payer: Self-pay | Source: Home / Self Care | Attending: General Surgery

## 2020-03-13 ENCOUNTER — Ambulatory Visit (HOSPITAL_BASED_OUTPATIENT_CLINIC_OR_DEPARTMENT_OTHER)
Admission: RE | Admit: 2020-03-13 | Discharge: 2020-03-13 | Disposition: A | Discharge status: Home / Self Care | Payer: Medicare Other | Attending: General Surgery | Admitting: General Surgery

## 2020-03-13 ENCOUNTER — Other Ambulatory Visit: Payer: Self-pay

## 2020-03-13 ENCOUNTER — Encounter (HOSPITAL_BASED_OUTPATIENT_CLINIC_OR_DEPARTMENT_OTHER): Payer: Self-pay | Admitting: General Surgery

## 2020-03-13 DIAGNOSIS — Z79899 Other long term (current) drug therapy: Secondary | ICD-10-CM | POA: Diagnosis not present

## 2020-03-13 DIAGNOSIS — I5030 Unspecified diastolic (congestive) heart failure: Secondary | ICD-10-CM | POA: Insufficient documentation

## 2020-03-13 DIAGNOSIS — Z803 Family history of malignant neoplasm of breast: Secondary | ICD-10-CM | POA: Insufficient documentation

## 2020-03-13 DIAGNOSIS — D1723 Benign lipomatous neoplasm of skin and subcutaneous tissue of right leg: Secondary | ICD-10-CM | POA: Insufficient documentation

## 2020-03-13 DIAGNOSIS — Z811 Family history of alcohol abuse and dependence: Secondary | ICD-10-CM | POA: Diagnosis not present

## 2020-03-13 DIAGNOSIS — Z823 Family history of stroke: Secondary | ICD-10-CM | POA: Diagnosis not present

## 2020-03-13 DIAGNOSIS — D1779 Benign lipomatous neoplasm of other sites: Secondary | ICD-10-CM | POA: Diagnosis not present

## 2020-03-13 DIAGNOSIS — M810 Age-related osteoporosis without current pathological fracture: Secondary | ICD-10-CM | POA: Diagnosis not present

## 2020-03-13 DIAGNOSIS — Z836 Family history of other diseases of the respiratory system: Secondary | ICD-10-CM | POA: Diagnosis not present

## 2020-03-13 DIAGNOSIS — Z91040 Latex allergy status: Secondary | ICD-10-CM | POA: Insufficient documentation

## 2020-03-13 DIAGNOSIS — Z820 Family history of epilepsy and other diseases of the nervous system: Secondary | ICD-10-CM | POA: Diagnosis not present

## 2020-03-13 DIAGNOSIS — Z87891 Personal history of nicotine dependence: Secondary | ICD-10-CM | POA: Insufficient documentation

## 2020-03-13 DIAGNOSIS — Z8616 Personal history of COVID-19: Secondary | ICD-10-CM | POA: Insufficient documentation

## 2020-03-13 DIAGNOSIS — M7989 Other specified soft tissue disorders: Secondary | ICD-10-CM | POA: Diagnosis not present

## 2020-03-13 DIAGNOSIS — E785 Hyperlipidemia, unspecified: Secondary | ICD-10-CM | POA: Diagnosis not present

## 2020-03-13 DIAGNOSIS — N289 Disorder of kidney and ureter, unspecified: Secondary | ICD-10-CM | POA: Diagnosis not present

## 2020-03-13 HISTORY — PX: MASS EXCISION: SHX2000

## 2020-03-13 SURGERY — EXCISION MASS
Anesthesia: General | Site: Hip | Laterality: Right

## 2020-03-13 MED ORDER — ROCURONIUM BROMIDE 10 MG/ML (PF) SYRINGE
PREFILLED_SYRINGE | INTRAVENOUS | Status: AC
  Filled 2020-03-13: qty 10

## 2020-03-13 MED ORDER — DEXAMETHASONE SODIUM PHOSPHATE 10 MG/ML IJ SOLN
INTRAMUSCULAR | Status: AC
  Filled 2020-03-13: qty 1

## 2020-03-13 MED ORDER — ONDANSETRON HCL 4 MG/2ML IJ SOLN
INTRAMUSCULAR | Status: AC
  Filled 2020-03-13: qty 2

## 2020-03-13 MED ORDER — CEFAZOLIN SODIUM-DEXTROSE 2-4 GM/100ML-% IV SOLN
2.0000 g | INTRAVENOUS | Status: AC
  Administered 2020-03-13: 10:00:00 2 g via INTRAVENOUS

## 2020-03-13 MED ORDER — BUPIVACAINE HCL (PF) 0.25 % IJ SOLN
INTRAMUSCULAR | Status: DC | PRN
  Administered 2020-03-13: 10 mL

## 2020-03-13 MED ORDER — ACETAMINOPHEN 500 MG PO TABS
ORAL_TABLET | ORAL | Status: AC
  Filled 2020-03-13: qty 2

## 2020-03-13 MED ORDER — CEFAZOLIN SODIUM-DEXTROSE 2-4 GM/100ML-% IV SOLN
INTRAVENOUS | Status: AC
  Filled 2020-03-13: qty 100

## 2020-03-13 MED ORDER — DEXAMETHASONE SODIUM PHOSPHATE 10 MG/ML IJ SOLN
INTRAMUSCULAR | Status: DC | PRN
  Administered 2020-03-13: 10 mg via INTRAVENOUS

## 2020-03-13 MED ORDER — PHENYLEPHRINE HCL (PRESSORS) 10 MG/ML IV SOLN
INTRAVENOUS | Status: DC | PRN
  Administered 2020-03-13 (×3): 80 ug via INTRAVENOUS

## 2020-03-13 MED ORDER — ACETAMINOPHEN 500 MG PO TABS
1000.0000 mg | ORAL_TABLET | ORAL | Status: AC
  Administered 2020-03-13: 08:00:00 1000 mg via ORAL

## 2020-03-13 MED ORDER — FENTANYL CITRATE (PF) 100 MCG/2ML IJ SOLN: 25.0000 ug | INTRAMUSCULAR | Status: DC | PRN

## 2020-03-13 MED ORDER — OXYCODONE HCL 5 MG PO TABS: 5.0000 mg | ORAL_TABLET | Freq: Once | ORAL | Status: DC | PRN

## 2020-03-13 MED ORDER — LIDOCAINE 2% (20 MG/ML) 5 ML SYRINGE
INTRAMUSCULAR | Status: AC
  Filled 2020-03-13: qty 5

## 2020-03-13 MED ORDER — PROPOFOL 10 MG/ML IV BOLUS
INTRAVENOUS | Status: AC
  Filled 2020-03-13: qty 20

## 2020-03-13 MED ORDER — SUGAMMADEX SODIUM 200 MG/2ML IV SOLN
INTRAVENOUS | Status: DC | PRN
  Administered 2020-03-13: 200 mg via INTRAVENOUS

## 2020-03-13 MED ORDER — ROCURONIUM BROMIDE 100 MG/10ML IV SOLN
INTRAVENOUS | Status: DC | PRN
  Administered 2020-03-13: 50 mg via INTRAVENOUS

## 2020-03-13 MED ORDER — MIDAZOLAM HCL 2 MG/2ML IJ SOLN
INTRAMUSCULAR | Status: AC
  Filled 2020-03-13: qty 2

## 2020-03-13 MED ORDER — LIDOCAINE 2% (20 MG/ML) 5 ML SYRINGE
INTRAMUSCULAR | Status: DC | PRN
  Administered 2020-03-13: 80 mg via INTRAVENOUS

## 2020-03-13 MED ORDER — FENTANYL CITRATE (PF) 100 MCG/2ML IJ SOLN
INTRAMUSCULAR | Status: AC
  Filled 2020-03-13: qty 2

## 2020-03-13 MED ORDER — ONDANSETRON HCL 4 MG/2ML IJ SOLN
INTRAMUSCULAR | Status: DC | PRN
  Administered 2020-03-13: 4 mg via INTRAVENOUS

## 2020-03-13 MED ORDER — LACTATED RINGERS IV SOLN: INTRAVENOUS | Status: DC

## 2020-03-13 MED ORDER — FENTANYL CITRATE (PF) 100 MCG/2ML IJ SOLN
INTRAMUSCULAR | Status: DC | PRN
  Administered 2020-03-13: 100 ug via INTRAVENOUS

## 2020-03-13 MED ORDER — EPHEDRINE SULFATE 50 MG/ML IJ SOLN
INTRAMUSCULAR | Status: DC | PRN
  Administered 2020-03-13: 10 mg via INTRAVENOUS
  Administered 2020-03-13: 5 mg via INTRAVENOUS
  Administered 2020-03-13 (×2): 10 mg via INTRAVENOUS

## 2020-03-13 MED ORDER — PHENYLEPHRINE 40 MCG/ML (10ML) SYRINGE FOR IV PUSH (FOR BLOOD PRESSURE SUPPORT)
PREFILLED_SYRINGE | INTRAVENOUS | Status: AC
  Filled 2020-03-13: qty 10

## 2020-03-13 MED ORDER — OXYCODONE HCL 5 MG/5ML PO SOLN
5.0000 mg | Freq: Once | ORAL | Status: DC | PRN
Start: 2020-03-13 — End: 2020-03-13

## 2020-03-13 MED ORDER — ONDANSETRON HCL 4 MG/2ML IJ SOLN: 4.0000 mg | Freq: Once | INTRAMUSCULAR | Status: DC | PRN

## 2020-03-13 MED ORDER — EPHEDRINE 5 MG/ML INJ
INTRAVENOUS | Status: AC
  Filled 2020-03-13: qty 10

## 2020-03-13 MED ORDER — MIDAZOLAM HCL 5 MG/5ML IJ SOLN
INTRAMUSCULAR | Status: DC | PRN
  Administered 2020-03-13: 2 mg via INTRAVENOUS

## 2020-03-13 MED ORDER — PROPOFOL 10 MG/ML IV BOLUS
INTRAVENOUS | Status: DC | PRN
  Administered 2020-03-13: 160 mg via INTRAVENOUS

## 2020-03-13 SURGICAL SUPPLY — 53 items
ADH SKN CLS APL DERMABOND .7 (GAUZE/BANDAGES/DRESSINGS) ×1
APL PRP STRL LF DISP 70% ISPRP (MISCELLANEOUS) ×1
BLADE CLIPPER SURG (BLADE) IMPLANT
BLADE SURG 15 STRL LF DISP TIS (BLADE) ×1 IMPLANT
BLADE SURG 15 STRL SS (BLADE) ×3
CANISTER SUCT 1200ML W/VALVE (MISCELLANEOUS) ×2 IMPLANT
CHLORAPREP W/TINT 26 (MISCELLANEOUS) ×3 IMPLANT
CLOSURE STERI-STRIP 1/2X4 (GAUZE/BANDAGES/DRESSINGS) ×1
CLSR STERI-STRIP ANTIMIC 1/2X4 (GAUZE/BANDAGES/DRESSINGS) ×2 IMPLANT
COVER BACK TABLE 60X90IN (DRAPES) ×3 IMPLANT
COVER MAYO STAND STRL (DRAPES) ×3 IMPLANT
COVER WAND RF STERILE (DRAPES) IMPLANT
DECANTER SPIKE VIAL GLASS SM (MISCELLANEOUS) IMPLANT
DERMABOND ADVANCED (GAUZE/BANDAGES/DRESSINGS) ×2
DERMABOND ADVANCED .7 DNX12 (GAUZE/BANDAGES/DRESSINGS) ×1 IMPLANT
DRAPE LAPAROTOMY 100X72 PEDS (DRAPES) ×3 IMPLANT
DRAPE UTILITY XL STRL (DRAPES) ×3 IMPLANT
DRSG TEGADERM 4X4.75 (GAUZE/BANDAGES/DRESSINGS) IMPLANT
ELECT COATED BLADE 2.86 ST (ELECTRODE) IMPLANT
ELECT REM PT RETURN 9FT ADLT (ELECTROSURGICAL) ×3
ELECTRODE REM PT RTRN 9FT ADLT (ELECTROSURGICAL) ×1 IMPLANT
GAUZE PACKING IODOFORM 1/4X15 (PACKING) IMPLANT
GAUZE SPONGE 4X4 12PLY STRL LF (GAUZE/BANDAGES/DRESSINGS) IMPLANT
GLOVE BIO SURGEON STRL SZ7 (GLOVE) ×3 IMPLANT
GLOVE BIOGEL PI IND STRL 7.5 (GLOVE) ×1 IMPLANT
GLOVE BIOGEL PI INDICATOR 7.5 (GLOVE) ×2
GOWN STRL REUS W/ TWL LRG LVL3 (GOWN DISPOSABLE) ×3 IMPLANT
GOWN STRL REUS W/TWL LRG LVL3 (GOWN DISPOSABLE) ×9
KIT MARKER MARGIN INK (KITS) IMPLANT
NDL HYPO 25X1 1.5 SAFETY (NEEDLE) ×1 IMPLANT
NEEDLE HYPO 25X1 1.5 SAFETY (NEEDLE) ×3 IMPLANT
NS IRRIG 1000ML POUR BTL (IV SOLUTION) ×2 IMPLANT
PACK BASIN DAY SURGERY FS (CUSTOM PROCEDURE TRAY) ×3 IMPLANT
PENCIL SMOKE EVACUATOR (MISCELLANEOUS) ×3 IMPLANT
SLEEVE SCD COMPRESS KNEE MED (MISCELLANEOUS) ×3 IMPLANT
SPONGE LAP 4X18 RFD (DISPOSABLE) ×3 IMPLANT
SUT ETHILON 2 0 FS 18 (SUTURE) IMPLANT
SUT MNCRL AB 4-0 PS2 18 (SUTURE) ×3 IMPLANT
SUT SILK 2 0 SH (SUTURE) IMPLANT
SUT VIC AB 2-0 SH 27 (SUTURE) ×3
SUT VIC AB 2-0 SH 27XBRD (SUTURE) IMPLANT
SUT VIC AB 3-0 SH 27 (SUTURE) ×3
SUT VIC AB 3-0 SH 27X BRD (SUTURE) IMPLANT
SUT VICRYL 3-0 CR8 SH (SUTURE) IMPLANT
SUT VICRYL 4-0 PS2 18IN ABS (SUTURE) IMPLANT
SWAB COLLECTION DEVICE MRSA (MISCELLANEOUS) IMPLANT
SWAB CULTURE ESWAB REG 1ML (MISCELLANEOUS) IMPLANT
SYR CONTROL 10ML LL (SYRINGE) ×3 IMPLANT
TOWEL GREEN STERILE FF (TOWEL DISPOSABLE) ×3 IMPLANT
TUBE CONNECTING 20'X1/4 (TUBING) ×1
TUBE CONNECTING 20X1/4 (TUBING) ×1 IMPLANT
UNDERPAD 30X36 HEAVY ABSORB (UNDERPADS AND DIAPERS) IMPLANT
YANKAUER SUCT BULB TIP NO VENT (SUCTIONS) ×2 IMPLANT

## 2020-03-13 NOTE — H&P (Signed)
  79 yof who is mother of one of my breast cancer patients who passed (TC). she since June has noted some pain and a mass in right hip. this is getting bigger and now can see it also. no infection ever. no real trauma ever either. [place] of this area in June shows multiple cystic masses with largest measuring 3.3x5.4 cm in size. an MR of right hip in August shows encapsulated lesion in right buttock that measures 3.2x3.8 cm. she is unable to lie on that side and would like to consider excision   Past Surgical History  Appendectomy  Cesarean Section - Multiple  Hysterectomy (not due to cancer) - Partial  Tonsillectomy   Diagnostic Studies History  Colonoscopy  1-5 years ago Mammogram  within last year Pap Smear  1-5 years ago  Allergies  Latex  Allergies Reconciled   Medication History  Rosuvastatin Calcium (10MG  Tablet, Oral) Active. Medications Reconciled  Social History  Alcohol use  Occasional alcohol use. Caffeine use  Carbonated beverages, Coffee, Tea. No drug use  Tobacco use  Former smoker.  Family History  Alcohol Abuse  Brother, Father. Breast Cancer  Daughter. Cerebrovascular Accident  Mother. Migraine Headache  Mother. Respiratory Condition  Father.  Pregnancy / Birth History Age at menarche  60 years. Age of menopause  59-61 Contraceptive History  Oral contraceptives. Gravida  2 Length (months) of breastfeeding  3-6 Maternal age  24-25 Para  2 Regular periods   Other Problems Diverticulosis  Hypercholesterolemia  Kidney Stone  Migraine Headache    Review of Systems  General Not Present- Appetite Loss, Chills, Fatigue, Fever, Night Sweats, Weight Gain and Weight Loss. Skin Not Present- Change in Wart/Mole, Dryness, Hives, Jaundice, New Lesions, Non-Healing Wounds, Rash and Ulcer. HEENT Not Present- Earache, Hearing Loss, Hoarseness, Nose Bleed, Oral Ulcers, Ringing in the Ears, Seasonal Allergies, Sinus Pain, Sore  Throat, Visual Disturbances, Wears glasses/contact lenses and Yellow Eyes. Respiratory Not Present- Bloody sputum, Chronic Cough, Difficulty Breathing, Snoring and Wheezing. Breast Not Present- Breast Mass, Breast Pain, Nipple Discharge and Skin Changes. Cardiovascular Not Present- Chest Pain, Difficulty Breathing Lying Down, Leg Cramps, Palpitations, Rapid Heart Rate, Shortness of Breath and Swelling of Extremities. Gastrointestinal Present- Hemorrhoids. Not Present- Abdominal Pain, Bloating, Bloody Stool, Change in Bowel Habits, Chronic diarrhea, Constipation, Difficulty Swallowing, Excessive gas, Gets full quickly at meals, Indigestion, Nausea, Rectal Pain and Vomiting. Female Genitourinary Not Present- Frequency, Nocturia, Painful Urination, Pelvic Pain and Urgency. Musculoskeletal Not Present- Back Pain, Joint Pain, Joint Stiffness, Muscle Pain, Muscle Weakness and Swelling of Extremities. Neurological Not Present- Decreased Memory, Fainting, Headaches, Numbness, Seizures, Tingling, Tremor, Trouble walking and Weakness. Psychiatric Not Present- Anxiety, Bipolar, Change in Sleep Pattern, Depression, Fearful and Frequent crying. Endocrine Not Present- Cold Intolerance, Excessive Hunger, Hair Changes, Heat Intolerance, Hot flashes and New Diabetes. Hematology Not Present- Blood Thinners, Easy Bruising, Excessive bleeding, Gland problems, HIV and Persistent Infections.    Physical Exam Abdomen Note: right buttock lateral there is a 6x5 cm mobile nontender mass present   Assessment & Plan  HIP MASS, RIGHT (R22.41) Story: Right hip/buttock mass excision discussed observation but due to enlarging and symptoms we elected to excise this. discussed excision with risks, recovery will send to path. will schedule

## 2020-03-13 NOTE — Anesthesia Procedure Notes (Signed)
Procedure Name: Intubation Date/Time: 03/13/2020 10:08 AM Performed by: Lavonia Dana, CRNA Pre-anesthesia Checklist: Patient identified, Emergency Drugs available, Suction available and Patient being monitored Patient Re-evaluated:Patient Re-evaluated prior to induction Oxygen Delivery Method: Circle system utilized Preoxygenation: Pre-oxygenation with 100% oxygen Induction Type: IV induction Ventilation: Mask ventilation without difficulty Laryngoscope Size: Mac and 3 Grade View: Grade II Tube type: Oral Number of attempts: 1 Airway Equipment and Method: Stylet and Bite block Placement Confirmation: ETT inserted through vocal cords under direct vision,  positive ETCO2 and breath sounds checked- equal and bilateral Secured at: 22 cm Tube secured with: Tape Dental Injury: Teeth and Oropharynx as per pre-operative assessment

## 2020-03-13 NOTE — Discharge Instructions (Signed)
[facility] POST [facility] INSTRUCTIONS  Always review your discharge instruction sheet given to you by the facility where your surgery was performed. IF YOU HAVE DISABILITY OR FAMILY LEAVE FORMS, YOU MUST BRING THEM TO THE OFFICE FOR PROCESSING.   DO NOT GIVE THEM TO YOUR DOCTOR.  1. A  prescription for pain medication may be given to you upon discharge.  Take your pain medication as prescribed, if needed.  If narcotic pain medicine is not needed, then you may take acetaminophen (Tylenol), naprosyn (Alleve) or ibuprofen (Advil) as needed. 2. Take your usually prescribed medications unless otherwise directed. 3. If you need a refill on your pain medication, please contact your pharmacy.  They will contact our office to request authorization. Prescriptions will not be filled after 5 pm or on week-ends. 4. You should follow a light diet the first 24 hours after arrival home, such as soup and crackers, etc.  Be sure to include lots of fluids daily.  Resume your normal diet the day after surgery. 5. Most patients will experience some swelling and bruising.  Ice packs  will help.  Swelling and bruising can take several days to resolve.  6. It is common to experience some constipation if taking pain medication after surgery.  Increasing fluid intake and taking a stool softener (such as Colace) will usually help or prevent this problem from occurring.  A mild laxative (Milk of Magnesia or Miralax) should be taken according to package directions if there are no bowel movements after 48 hours. 7. Unless discharge instructions indicate otherwise, you may remove your bandages 48 hours after surgery, and you may shower at that time.  You may have steri-strips (small skin tapes) in place directly over the incision.  These strips should be left on the skin for 7-10 days and will come off on their own.  If your surgeon used skin glue on the incision, you may shower in 24 hours.  The glue will flake off  over the next 2-3 weeks.  Any sutures or staples will be removed at the office during your follow-up visit. 8. ACTIVITIES:  You may resume regular (light) daily activities beginning the next day--such as daily self-care, walking, climbing stairs--gradually increasing activities as tolerated.  You may have sexual intercourse when it is comfortable.   a. You may drive when you are no longer taking prescription pain medication, you can comfortably wear a seatbelt, and you can safely maneuver your car and apply brakes. b. RETURN TO WORK:  __________________________________________________________ 9. You should see your doctor in the office for a follow-up appointment approximately 3 weeks after your surgery.  Make sure that you call for this appointment within a day or two after you arrive home to insure a convenient appointment time. 10. OTHER INSTRUCTIONS:  __________________________________________________________________________________________________________________________________________________________________________________________  WHEN TO CALL YOUR DOCTOR: 1. Fever over 101.0 2. Nausea and/or vomiting 3. Extreme swelling or bruising 4. Continued bleeding from incision. 5. Increased pain, redness, or drainage from the incision  The clinic staff is available to answer your questions during regular business hours.  Please don't hesitate to call and ask to speak to one of the nurses for clinical concerns.  If you have a medical emergency, go to the nearest emergency room or call 911.  A surgeon from [facility] is always on call at the hospital   [address] [place], [place], [place]  [address] ?  [address], [place], [place]   [address] [phone] ?  [phone] ? FAX [phone] Web site: [email or web address]  No Tylenol until 2:27 pm   Post Anesthesia Home Care Instructions  Activity: Get plenty of rest for the remainder of the day. A  responsible individual must stay with you for 24 hours following the procedure.  For the next 24 hours, DO NOT: -Drive a car -Paediatric nurse -Drink alcoholic beverages -Take any medication unless instructed by your physician -Make any legal decisions or sign important papers.  Meals: Start with liquid foods such as gelatin or soup. Progress to regular foods as tolerated. Avoid greasy, spicy, heavy foods. If nausea and/or vomiting occur, drink only clear liquids until the nausea and/or vomiting subsides. Call your physician if vomiting continues.  Special Instructions/Symptoms: Your throat may feel dry or sore from the anesthesia or the breathing tube placed in your throat during surgery. If this causes discomfort, gargle with warm salt water. The discomfort should disappear within 24 hours.  If you had a scopolamine patch placed behind your ear for the management of post- operative nausea and/or vomiting:  1. The medication in the patch is effective for 72 hours, after which it should be removed.  Wrap patch in a tissue and discard in the trash. Wash hands thoroughly with soap and water. 2. You may remove the patch earlier than 72 hours if you experience unpleasant side effects which may include dry mouth, dizziness or visual disturbances. 3. Avoid touching the patch. Wash your hands with soap and water after contact with the patch.

## 2020-03-13 NOTE — Transfer of Care (Signed)
Immediate Anesthesia Transfer of Care Note  Patient: Bonnie Brady  Procedure(s) Performed: EXCISION OF RIGHT HIP MASS (Right Hip)  Patient Location: PACU  Anesthesia Type:General  Level of Consciousness: awake, alert  and oriented  Airway & Oxygen Therapy: Patient Spontanous Breathing and Patient connected to face mask oxygen  Post-op Assessment: Report given to RN and Post -op Vital signs reviewed and stable  Post vital signs: Reviewed and stable  Last Vitals:  Vitals Value Taken Time  BP 137/77 03/13/20 1052  Temp 36.4 C 03/13/20 1052  Pulse 95 03/13/20 1054  Resp 24 03/13/20 1054  SpO2 100 % 03/13/20 1054  Vitals shown include unvalidated device data.  Last Pain:  Vitals:   03/13/20 0825  TempSrc: Oral  PainSc: 0-No pain         Complications: No complications documented.

## 2020-03-13 NOTE — Op Note (Signed)
Preoperative diagnosis: right hip mass Postoperative diagnosis: right hip lipoma Procedure: excision of 11x6x4 cm lipoma, suprafascial Surgeon: Dr Serita Grammes Anes: general EBL minimal Complications none Drains none Specimens: right hip mass to pathology clinically c/w lipoma Sponge and needle count correct dispo recovery stable.  Indications: 55 yof who is mother of one of my breast cancer patients who passed (TC). she since June has noted some pain and a mass in right hip. this is getting bigger and now can see it also. no infection ever. no real trauma ever either. [place] of this area in June shows multiple cystic masses with largest measuring 3.3x5.4 cm in size. an MR of right hip in August shows encapsulated lesion in right buttock that measures 3.2x3.8 cm. she is unable to lie on that side and would like to consider excision  Procedure: After informed consent obtained patient was taken to OR. She was given antibiotics. SCDs were in place. She was placed under general anesthesia without complication. She was rolled into left lateral position with a beanbag and appropriately padded.  She was then prepped and draped in standard sterile surgical fashion. A timeout was performed.  I infiltrated marcaine and then made a vertical incision above the mass. I dissected down and entered the pocket. Clinically it was a lipoma that just popped out when I entered the cavity.  Hemostasis obtained. I then closed the deep layers with 2-0 vicryl. The skin was closed with 3-0 vicryl and 4-0 monocryl. Glue and steristrips were applied.  She tolerated well and was transferred to pacu stable.

## 2020-03-13 NOTE — Anesthesia Preprocedure Evaluation (Addendum)
Anesthesia Evaluation  Patient identified by MRN, date of birth, ID band Patient awake    Reviewed: Allergy & Precautions, NPO status , Patient's Chart, lab work & pertinent test results  Airway Mallampati: II  TM Distance: >3 FB Neck ROM: Full    Dental no notable dental hx. (+) Teeth Intact   Pulmonary former smoker,  Covid + 10/2018 hospitalized, recovered   Pulmonary exam normal breath sounds clear to auscultation       Cardiovascular negative cardio ROS Normal cardiovascular exam Rhythm:Regular Rate:Normal  EKG NSR, LAFB, poor R wave progression, possible old AWMI  Echo 11/10/2015 -Left ventricle: The cavity size was normal. Wall thickness was normal. Systolic function was normal. The estimated ejection fraction was in the range of 55% to 60%. Wall motion was normal; there were no regional wall motion abnormalities. Doppler parameters are consistent with abnormal left ventricular relaxation (grade 1 diastolic dysfunction).  - Mitral valve: There was trivial regurgitation.   Myocardial Perfusion Scan 01/28/2016  The left ventricular ejection fraction is normal (55-65%).  Nuclear stress EF: 65%. There are no wall motion abnormalities  Upsloping ST segment depression ST segment depression of 1 mm was noted during stress in the III and aVF leads. Nondiagnostic  7 minutes and 30 seconds exercise time-good  This is a low risk study. No perfusion defects, no ischemia     Neuro/Psych negative neurological ROS  negative psych ROS   GI/Hepatic negative GI ROS, Neg liver ROS,   Endo/Other    Renal/GU Renal disease  negative genitourinary   Musculoskeletal Right hip mass   Abdominal   Peds  Hematology negative hematology ROS (+)   Anesthesia Other Findings   Reproductive/Obstetrics                           Anesthesia Physical Anesthesia Plan  ASA: II  Anesthesia Plan: General    Post-op Pain Management:    Induction: Intravenous  PONV Risk Score and Plan: 4 or greater and Ondansetron, Treatment may vary due to age or medical condition and Dexamethasone  Airway Management Planned: LMA  Additional Equipment:   Intra-op Plan:   Post-operative Plan: Extubation in OR  Informed Consent: I have reviewed the patients History and Physical, chart, labs and discussed the procedure including the risks, benefits and alternatives for the proposed anesthesia with the patient or authorized representative who has indicated his/her understanding and acceptance.     Dental advisory given  Plan Discussed with: CRNA, Surgeon and Anesthesiologist  Anesthesia Plan Comments:         Anesthesia Quick Evaluation

## 2020-03-13 NOTE — Anesthesia Postprocedure Evaluation (Signed)
Anesthesia Post Note  Patient: POETRY CERRO  Procedure(s) Performed: EXCISION OF RIGHT HIP MASS (Right Hip)     Patient location during evaluation: PACU Anesthesia Type: General Level of consciousness: awake and alert Pain management: pain level controlled Vital Signs Assessment: post-procedure vital signs reviewed and stable Respiratory status: spontaneous breathing, nonlabored ventilation and respiratory function stable Cardiovascular status: blood pressure returned to baseline and stable Postop Assessment: no apparent nausea or vomiting Anesthetic complications: no   No complications documented.  Last Vitals:  Vitals:   03/13/20 1110 03/13/20 1120  BP: 131/84 122/66  Pulse: 98 91  Resp: 19 18  Temp:  36.4 C  SpO2: 99% 99%    Last Pain:  Vitals:   03/13/20 1120  TempSrc:   PainSc: 1                  Javante Nilsson A.

## 2020-03-13 NOTE — Interval H&P Note (Signed)
History and Physical Interval Note:  03/13/2020 9:24 AM  Bonnie Brady  has presented today for surgery, with the diagnosis of RIGHT HIP MASS.  The various methods of treatment have been discussed with the patient and family. After consideration of risks, benefits and other options for treatment, the patient has consented to  Procedure(s): EXCISION OF RIGHT HIP MASS (Right) as a surgical intervention.  The patient's history has been reviewed, patient examined, no change in status, stable for surgery.  I have reviewed the patient's chart and labs.  Questions were answered to the patient's satisfaction.     Rolm Bookbinder

## 2020-03-14 ENCOUNTER — Encounter (HOSPITAL_BASED_OUTPATIENT_CLINIC_OR_DEPARTMENT_OTHER): Payer: Self-pay | Admitting: General Surgery

## 2020-03-14 LAB — SURGICAL PATHOLOGY

## 2020-04-04 DIAGNOSIS — R3121 Asymptomatic microscopic hematuria: Secondary | ICD-10-CM | POA: Diagnosis not present

## 2020-04-04 DIAGNOSIS — N905 Atrophy of vulva: Secondary | ICD-10-CM | POA: Diagnosis not present

## 2020-04-29 DIAGNOSIS — Z780 Asymptomatic menopausal state: Secondary | ICD-10-CM | POA: Diagnosis not present

## 2020-04-29 DIAGNOSIS — Z01419 Encounter for gynecological examination (general) (routine) without abnormal findings: Secondary | ICD-10-CM | POA: Diagnosis not present

## 2020-04-29 DIAGNOSIS — N952 Postmenopausal atrophic vaginitis: Secondary | ICD-10-CM | POA: Diagnosis not present

## 2020-04-29 DIAGNOSIS — Z90710 Acquired absence of both cervix and uterus: Secondary | ICD-10-CM | POA: Diagnosis not present

## 2020-05-27 ENCOUNTER — Telehealth: Payer: Medicare Other | Admitting: Family

## 2020-05-28 ENCOUNTER — Telehealth (INDEPENDENT_AMBULATORY_CARE_PROVIDER_SITE_OTHER): Payer: Medicare Other | Admitting: Family

## 2020-05-28 ENCOUNTER — Other Ambulatory Visit: Payer: Self-pay

## 2020-05-28 DIAGNOSIS — J019 Acute sinusitis, unspecified: Secondary | ICD-10-CM

## 2020-05-28 MED ORDER — AMOXICILLIN-POT CLAVULANATE 875-125 MG PO TABS: 1.0000 | ORAL_TABLET | Freq: Two times a day (BID) | ORAL | 0 refills | Status: DC

## 2020-05-28 NOTE — Progress Notes (Deleted)
Subjective:    Patient ID: Bonnie Brady, female    DOB: 18-Sep-1950, 70 y.o.   MRN: [id number]  HPI   Sore throat 2 weeks ago. Now in sinuses and ears.  Nasal congestion, drainage, green when she blows her nose.  She denies fever,   BP Readings from Last 3 Encounters:  03/13/20 122/66  09/15/19 132/81  04/12/19 (!) 141/69    Review of Systems     Past Medical History:  Diagnosis Date  . Borderline diabetes   . COVID-19 10/2018  . Hyperlipidemia   . Kidney stone   . Osteopenia   . Tachycardia    normal cardiology workup, no palpitations in 5-6 yrs  . Varicose veins      Social History   Socioeconomic History  . Marital status: Married    Spouse name: Not on file  . Number of children: Not on file  . Years of education: Not on file  . Highest education level: Not on file  Occupational History  . Not on file  Tobacco Use  . Smoking status: Former Smoker    Types: Cigarettes    Quit date: 03/30/2008    Years since quitting: 12.1  . Smokeless tobacco: Never Used  Vaping Use  . Vaping Use: Not on file  Substance and Sexual Activity  . Alcohol use: Yes    Comment: occasional   . Drug use: No  . Sexual activity: Not on file  Other Topics Concern  . Not on file  Social History Narrative   Has worked as Engineering geologist- stopped working 2011.    Married   2 children   Enjoys Airline pilot      Social Determinants of Health   Financial Resource Strain: Not on file  Food Insecurity: Not on file  Transportation Needs: Not on file  Physical Activity: Not on file  Stress: Not on file  Social Connections: Not on file  Intimate Partner Violence: Not on file    Past Surgical History:  Procedure Laterality Date  . ABDOMINAL HYSTERECTOMY    . APPENDECTOMY  1971  . bladder strap  2006  . [place]  . laser of veins  2010   also in 2016 and 2017  . MASS EXCISION Right 03/13/2020   Procedure: EXCISION OF  RIGHT HIP MASS;  Surgeon: Rolm Bookbinder, MD;  Location: [facility];  Service: General;  Laterality: Right;  . TONSILLECTOMY  1958    Family History  Problem Relation Age of Onset  . Breast cancer Paternal Grandmother   . Stroke Mother   . Stroke Maternal Grandfather   . COPD Father   . COPD Brother   . Breast cancer Daughter     Allergies  Allergen Reactions  . Ampicillin Hives    Questionable. Had a rash  . Latex Other (See Comments)    Contact with powder.  . Tetanus Toxoids Other (See Comments)    Swelling / redness  . Nickel Rash    Blisters    Current Outpatient Medications on File Prior to Visit  Medication Sig Dispense Refill  . calcium carbonate (OS-CAL - DOSED IN MG OF ELEMENTAL CALCIUM) 1250 (500 Ca) MG tablet Take 1 tablet by mouth.    . Estradiol-Estriol-Progesterone (BIEST/PROGESTERONE) CREA Place onto the skin.    . Nutritional Supplements (JUICE PLUS FIBRE PO) Take by mouth.    . rosuvastatin (CRESTOR) 10 MG tablet Take 1 tablet (  10 mg total) by mouth daily. 90 tablet 1  . Zoster Vaccine Adjuvanted [facility]) injection Inject 0.5mg  IM now and again in 2-6 months. 0.5 mL 1   No current facility-administered medications on file prior to visit.    There were no vitals taken for this visit.   Objective:   Physical Exam        Assessment & Plan:

## 2020-05-28 NOTE — Progress Notes (Signed)
Virtual Visit via Video Note  I connected with Bonnie Brady on 05/28/20 at  8:00 AM EST by a video enabled telemedicine application and verified that I am speaking with the correct person using two identifiers.  Location: Patient: home Provider: office   I discussed the limitations of evaluation and management by telemedicine and the availability of in person appointments. The patient expressed understanding and agreed to proceed. Only the patient and myself were present for today's video call.   History of Present Illness:   Sore throat 2 weeks ago. Now it is  "in my sinuses and ears."  Reports + nasal congestion, and green drainage when she blows her nose.  She denies fever.    Observations/Objective:   Gen: Awake, alert, no acute distress Resp: Breathing is even and non-labored Psych: calm/pleasant demeanor Neuro: Alert and Oriented x 3, + facial symmetry, speech is clear.   Assessment and Plan:  Sinusitis- will rx with augmentin- she states that her rash with ampicillin was "questionable" and that she was able to tolerate augmentin without difficulty.  She is advised to continue supportive measures as needed: tylenol, sudafed, neti pot.   Follow Up Instructions:    I discussed the assessment and treatment plan with the patient. The patient was provided an opportunity to ask questions and all were answered. The patient agreed with the plan and demonstrated an understanding of the instructions.   The patient was advised to call back or seek an in-person evaluation if the symptoms worsen or if the condition fails to improve as anticipated.  Nance Pear, NP

## 2020-07-31 ENCOUNTER — Other Ambulatory Visit: Payer: Self-pay | Admitting: Family

## 2020-09-30 DIAGNOSIS — Z20822 Contact with and (suspected) exposure to covid-19: Secondary | ICD-10-CM | POA: Diagnosis not present

## 2020-10-12 DIAGNOSIS — M19012 Primary osteoarthritis, left shoulder: Secondary | ICD-10-CM | POA: Diagnosis not present

## 2020-10-12 DIAGNOSIS — S42292A Other displaced fracture of upper end of left humerus, initial encounter for closed fracture: Secondary | ICD-10-CM | POA: Diagnosis not present

## 2020-10-12 DIAGNOSIS — S42202A Unspecified fracture of upper end of left humerus, initial encounter for closed fracture: Secondary | ICD-10-CM | POA: Diagnosis not present

## 2020-10-12 DIAGNOSIS — M25512 Pain in left shoulder: Secondary | ICD-10-CM | POA: Diagnosis not present

## 2020-10-12 DIAGNOSIS — R7303 Prediabetes: Secondary | ICD-10-CM | POA: Diagnosis not present

## 2020-10-12 DIAGNOSIS — M79642 Pain in left hand: Secondary | ICD-10-CM | POA: Diagnosis not present

## 2020-10-12 DIAGNOSIS — M8580 Other specified disorders of bone density and structure, unspecified site: Secondary | ICD-10-CM | POA: Diagnosis not present

## 2020-10-12 DIAGNOSIS — Z87891 Personal history of nicotine dependence: Secondary | ICD-10-CM | POA: Diagnosis not present

## 2020-10-22 DIAGNOSIS — S42202A Unspecified fracture of upper end of left humerus, initial encounter for closed fracture: Secondary | ICD-10-CM | POA: Diagnosis not present

## 2020-10-22 DIAGNOSIS — M25512 Pain in left shoulder: Secondary | ICD-10-CM | POA: Diagnosis not present

## 2020-10-26 ENCOUNTER — Other Ambulatory Visit: Payer: Self-pay | Admitting: Family

## 2020-10-29 DIAGNOSIS — S42301A Unspecified fracture of shaft of humerus, right arm, initial encounter for closed fracture: Secondary | ICD-10-CM | POA: Diagnosis not present

## 2020-11-13 DIAGNOSIS — S42202D Unspecified fracture of upper end of left humerus, subsequent encounter for fracture with routine healing: Secondary | ICD-10-CM | POA: Diagnosis not present

## 2020-11-18 DIAGNOSIS — M25612 Stiffness of left shoulder, not elsewhere classified: Secondary | ICD-10-CM | POA: Diagnosis not present

## 2020-11-18 DIAGNOSIS — M25512 Pain in left shoulder: Secondary | ICD-10-CM | POA: Diagnosis not present

## 2020-11-20 ENCOUNTER — Ambulatory Visit: Payer: Medicare Other | Admitting: Family

## 2020-11-20 DIAGNOSIS — M25512 Pain in left shoulder: Secondary | ICD-10-CM | POA: Diagnosis not present

## 2020-11-20 DIAGNOSIS — M25612 Stiffness of left shoulder, not elsewhere classified: Secondary | ICD-10-CM | POA: Diagnosis not present

## 2020-11-22 ENCOUNTER — Ambulatory Visit (INDEPENDENT_AMBULATORY_CARE_PROVIDER_SITE_OTHER): Payer: Medicare Other | Admitting: Family

## 2020-11-22 ENCOUNTER — Other Ambulatory Visit: Payer: Self-pay

## 2020-11-22 ENCOUNTER — Telehealth: Payer: Self-pay | Admitting: Family

## 2020-11-22 VITALS — BP 127/84 | HR 77 | Temp 98.2°F | Resp 16 | Wt 171.0 lb

## 2020-11-22 DIAGNOSIS — S42292D Other displaced fracture of upper end of left humerus, subsequent encounter for fracture with routine healing: Secondary | ICD-10-CM

## 2020-11-22 DIAGNOSIS — E7800 Pure hypercholesterolemia, unspecified: Secondary | ICD-10-CM

## 2020-11-22 DIAGNOSIS — S42292A Other displaced fracture of upper end of left humerus, initial encounter for closed fracture: Secondary | ICD-10-CM | POA: Insufficient documentation

## 2020-11-22 DIAGNOSIS — R7303 Prediabetes: Secondary | ICD-10-CM

## 2020-11-22 LAB — LIPID PANEL
Cholesterol: 179 mg/dL (ref 0–200)
HDL: 47.4 mg/dL
LDL Cholesterol: 92 mg/dL (ref 0–99)
NonHDL: 131.97
Total CHOL/HDL Ratio: 4
Triglycerides: 199 mg/dL — ABNORMAL HIGH (ref 0.0–149.0)
VLDL: 39.8 mg/dL (ref 0.0–40.0)

## 2020-11-22 LAB — COMPREHENSIVE METABOLIC PANEL
ALT: 17 U/L (ref 0–35)
AST: 15 U/L (ref 0–37)
Albumin: 4.4 g/dL (ref 3.5–5.2)
Alkaline Phosphatase: 91 U/L (ref 39–117)
BUN: 24 mg/dL — ABNORMAL HIGH (ref 6–23)
CO2: 26 mEq/L (ref 19–32)
Calcium: 10 mg/dL (ref 8.4–10.5)
Chloride: 106 mEq/L (ref 96–112)
Creatinine, Ser: 0.98 mg/dL (ref 0.40–1.20)
GFR: 58.85 mL/min — ABNORMAL LOW (ref >60.00)
Glucose, Bld: 83 mg/dL (ref 70–99)
Potassium: 4 mEq/L (ref 3.5–5.1)
Sodium: 142 mEq/L (ref 135–145)
Total Bilirubin: 0.5 mg/dL (ref 0.2–1.2)
Total Protein: 6.9 g/dL (ref 6.0–8.3)

## 2020-11-22 MED ORDER — ROSUVASTATIN CALCIUM 10 MG PO TABS: 10.0000 mg | ORAL_TABLET | Freq: Every day | ORAL | 1 refills | Status: DC

## 2020-11-22 NOTE — Telephone Encounter (Signed)
My apologies, but I meant to order an A1C when she was here today and forgot.  Would she be willing to return to the lab at her convenience another day to complete?

## 2020-11-22 NOTE — Assessment & Plan Note (Addendum)
Lab Results  Component Value Date   CHOL 177 02/07/2019   HDL 44.30 02/07/2019   LDLCALC 71 09/24/2016   LDLDIRECT 93.0 02/07/2019   TRIG 316.0 (H) 02/07/2019   CHOLHDL 4 02/07/2019    Tolerating statin. Continue crestor '10mg'$ . Obtain cmet, lipid panel.

## 2020-11-22 NOTE — Assessment & Plan Note (Signed)
Lab Results  Component Value Date   HGBA1C 6.3 04/12/2019

## 2020-11-22 NOTE — Telephone Encounter (Signed)
Patient was scheduled to come in for labs on 12-12-20 at he request.

## 2020-11-22 NOTE — Patient Instructions (Signed)
Please complete lab work prior to leaving.   

## 2020-11-22 NOTE — Telephone Encounter (Signed)
Please call [facility] and request copy of mammogram.

## 2020-11-22 NOTE — Assessment & Plan Note (Signed)
She is maintained in a sling and continues to follow with orthopedics.

## 2020-11-22 NOTE — Progress Notes (Addendum)
Subjective:   By signing my name below, I, Bonnie Brady, attest that this documentation has been prepared under the direction and in the presence of [name] NP. 11/22/2020    Patient ID: Bonnie Brady, female    DOB: Aug 04, 1950, 70 y.o.   MRN: [id number]  Chief Complaint  Patient presents with   Hyperlipidemia    Here for follow up, needs refill    HPI Patient is in today for a office visit.  She broke her arm 10/12/2020. She notes that while carrying luggage she fell and broke her arm. She is seeing a orthopedist and a physical therapist to manage her arm.  She complains that it feels like her scapula is out of place and is in pain since the weeks after her injury. Her fracture is in the upper head of the humerus. She has showed it to her orthopedist and was told it was due to muscle atrophy. She continues thinking there is some other reason for its pain.  Cholesterol- She is requesting to have her cholesterol checked during her next lab work. She continues taking 10 mg Crestor daily PO and reports no new issues while taking it. She is requesting a refill for it as well.  Lab Results  Component Value Date   CHOL 177 02/07/2019   HDL 44.30 02/07/2019   LDLCALC 71 09/24/2016   LDLDIRECT 93.0 02/07/2019   TRIG 316.0 (H) 02/07/2019   CHOLHDL 4 02/07/2019   Immunizations- She has 2 Covid-19 vaccines at this time.  Mammogram- She reports having a mammogram completed this year.    Health Maintenance Due  Topic Date Due   COVID-19 Vaccine (1) Never done   Hepatitis C Screening  Never done   Zoster Vaccines- Shingrix (1 of 2) Never done   INFLUENZA VACCINE  10/28/2020    Past Medical History:  Diagnosis Date   Borderline diabetes    COVID-19 10/2018   Hyperlipidemia    Kidney stone    Osteopenia    Tachycardia    normal cardiology workup, no palpitations in 5-6 yrs   Varicose veins     Past Surgical History:  Procedure Laterality Date   ABDOMINAL  HYSTERECTOMY     APPENDECTOMY  1971   bladder strap  2006   CESAREAN SECTION  1978 and 1979   laser of veins  2010   also in 2016 and 2017   MASS EXCISION Right 03/13/2020   Procedure: EXCISION OF RIGHT HIP MASS;  Surgeon: Rolm Bookbinder, MD;  Location: [place];  Service: General;  Laterality: Right;   TONSILLECTOMY  1958    Family History  Problem Relation Age of Onset   Breast cancer Paternal Grandmother    Stroke Mother    Stroke Maternal Grandfather    COPD Father    COPD Brother    Breast cancer Daughter     Social History   Socioeconomic History   Marital status: Married    Spouse name: Not on file   Number of children: Not on file   Years of education: Not on file   Highest education level: Not on file  Occupational History   Not on file  Tobacco Use   Smoking status: Former    Types: Cigarettes    Quit date: 03/30/2008    Years since quitting: 12.6   Smokeless tobacco: Never  Vaping Use   Vaping Use: Not on file  Substance and Sexual Activity   Alcohol use: Yes  Comment: occasional    Drug use: No   Sexual activity: Not on file  Other Topics Concern   Not on file  Social History Narrative   Has worked as Psychologist, 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    Married   2 children   Enjoys Airline pilot      Social Determinants of Health   Financial Resource Strain: Not on file  Food Insecurity: Not on file  Transportation Needs: Not on file  Physical Activity: Not on file  Stress: Not on file  Social Connections: Not on file  Intimate Partner Violence: Not on file    Outpatient Medications Prior to Visit  Medication Sig Dispense Refill   Estradiol-Estriol-Progesterone (BIEST/PROGESTERONE) CREA Place onto the skin.     Nutritional Supplements (JUICE PLUS FIBRE PO) Take by mouth.     Zoster Vaccine Adjuvanted [facility]) injection Inject 0.35m IM now and again in 2-6 months. 0.5 mL 1   rosuvastatin  (CRESTOR) 10 MG tablet TAKE 1 TABLET BY MOUTH EVERY DAY 90 tablet 0   amoxicillin-clavulanate (AUGMENTIN) 875-125 MG tablet Take 1 tablet by mouth 2 (two) times daily. 20 tablet 0   calcium carbonate (OS-CAL - DOSED IN MG OF ELEMENTAL CALCIUM) 1250 (500 Ca) MG tablet Take 1 tablet by mouth.     No facility-administered medications prior to visit.    Allergies  Allergen Reactions   Ampicillin Hives    Questionable. Had a rash   Latex Other (See Comments)    Contact with powder.   Tetanus Toxoids Other (See Comments)    Swelling / redness   Nickel Rash    Blisters    Review of Systems  Musculoskeletal:  Positive for myalgias (Left scapula pain).      Objective:    Physical Exam Constitutional:      General: She is not in acute distress.    Appearance: Normal appearance. She is not ill-appearing.  HENT:     Head: Normocephalic and atraumatic.     Right Ear: External ear normal.     Left Ear: External ear normal.  Eyes:     Extraocular Movements: Extraocular movements intact.     Pupils: Pupils are equal, round, and reactive to light.  Cardiovascular:     Rate and Rhythm: Normal rate and regular rhythm.     Heart sounds: Normal heart sounds. No murmur heard.   No gallop.  Pulmonary:     Effort: Pulmonary effort is normal. No respiratory distress.     Breath sounds: Normal breath sounds. No wheezing or rales.  Musculoskeletal:     Comments: Left scapula is slightly more prominent than right, no tenderness noted  Skin:    General: Skin is warm and dry.  Neurological:     Mental Status: She is alert and oriented to person, place, and time.  Psychiatric:        Behavior: Behavior normal.        Judgment: Judgment normal.    BP 127/84 (BP Location: Right Arm, Patient Position: Sitting, Cuff Size: Small)   Pulse 77   Temp 98.2 F (36.8 C) (Oral)   Resp 16   Wt 171 lb (77.6 kg)   SpO2 98%   BMI 27.60 kg/m  Wt Readings from Last 3 Encounters:  11/22/20 171 lb (77.6  kg)  03/13/20 180 lb 12.4 oz (82 kg)  09/15/19 175 lb (79.4 kg)       Assessment & Plan:   Problem List Items  Addressed This Visit       Unprioritized   Hyperlipidemia - Primary    Lab Results  Component Value Date   CHOL 177 02/07/2019   HDL 44.30 02/07/2019   LDLCALC 71 09/24/2016   LDLDIRECT 93.0 02/07/2019   TRIG 316.0 (H) 02/07/2019   CHOLHDL 4 02/07/2019   Tolerating statin. Continue crestor 100m. Obtain cmet, lipid panel.       Relevant Medications   rosuvastatin (CRESTOR) 10 MG tablet   Other Relevant Orders   Lipid panel   Comp Met (CMET)   Closed fracture of head of left humerus    She is maintained in a sling and continues to follow with orthopedics.       Borderline diabetes    Lab Results  Component Value Date   HGBA1C 6.3 04/12/2019       Pt will return to complete A1C- see phone note.    Meds ordered this encounter  Medications   rosuvastatin (CRESTOR) 10 MG tablet    Sig: Take 1 tablet (10 mg total) by mouth daily.    Dispense:  90 tablet    Refill:  1    Order Specific Question:   Supervising Provider    Answer:   BPenni HomansA [[id number]]    I, MDebbrah AlarNP, personally preformed the services described in this documentation.  All medical record entries made by the scribe were at my direction and in my presence.  I have reviewed the chart and discharge instructions (if applicable) and agree that the record reflects my personal performance and is accurate and complete. 11/22/2020   I,Bonnie Brady,acting as a sEducation administratorfor MNance Pear NP.,have documented all relevant documentation on the behalf of MNance Pear NP,as directed by  MNance Pear NP while in the presence of MNance Pear NP.   MNance Pear NP

## 2020-11-22 NOTE — Telephone Encounter (Signed)
Records release form faxed [employer]

## 2020-11-28 DIAGNOSIS — Z20822 Contact with and (suspected) exposure to covid-19: Secondary | ICD-10-CM | POA: Diagnosis not present

## 2020-12-05 DIAGNOSIS — M25512 Pain in left shoulder: Secondary | ICD-10-CM | POA: Diagnosis not present

## 2020-12-05 DIAGNOSIS — M25612 Stiffness of left shoulder, not elsewhere classified: Secondary | ICD-10-CM | POA: Diagnosis not present

## 2020-12-11 DIAGNOSIS — M25612 Stiffness of left shoulder, not elsewhere classified: Secondary | ICD-10-CM | POA: Diagnosis not present

## 2020-12-11 DIAGNOSIS — M25512 Pain in left shoulder: Secondary | ICD-10-CM | POA: Diagnosis not present

## 2020-12-12 ENCOUNTER — Other Ambulatory Visit: Payer: Self-pay

## 2020-12-12 ENCOUNTER — Other Ambulatory Visit (INDEPENDENT_AMBULATORY_CARE_PROVIDER_SITE_OTHER): Payer: Medicare Other

## 2020-12-12 DIAGNOSIS — M7502 Adhesive capsulitis of left shoulder: Secondary | ICD-10-CM | POA: Diagnosis not present

## 2020-12-12 DIAGNOSIS — R7303 Prediabetes: Secondary | ICD-10-CM

## 2020-12-12 DIAGNOSIS — S42202A Unspecified fracture of upper end of left humerus, initial encounter for closed fracture: Secondary | ICD-10-CM | POA: Diagnosis not present

## 2020-12-12 LAB — HEMOGLOBIN A1C: Hgb A1c MFr Bld: 5.8 % (ref 4.6–6.5)

## 2020-12-13 DIAGNOSIS — M25512 Pain in left shoulder: Secondary | ICD-10-CM | POA: Diagnosis not present

## 2020-12-13 DIAGNOSIS — M25612 Stiffness of left shoulder, not elsewhere classified: Secondary | ICD-10-CM | POA: Diagnosis not present

## 2020-12-17 DIAGNOSIS — M25512 Pain in left shoulder: Secondary | ICD-10-CM | POA: Diagnosis not present

## 2020-12-17 DIAGNOSIS — M25612 Stiffness of left shoulder, not elsewhere classified: Secondary | ICD-10-CM | POA: Diagnosis not present

## 2020-12-20 DIAGNOSIS — M25612 Stiffness of left shoulder, not elsewhere classified: Secondary | ICD-10-CM | POA: Diagnosis not present

## 2020-12-20 DIAGNOSIS — M25512 Pain in left shoulder: Secondary | ICD-10-CM | POA: Diagnosis not present

## 2021-01-01 DIAGNOSIS — M25612 Stiffness of left shoulder, not elsewhere classified: Secondary | ICD-10-CM | POA: Diagnosis not present

## 2021-01-01 DIAGNOSIS — M25512 Pain in left shoulder: Secondary | ICD-10-CM | POA: Diagnosis not present

## 2021-01-17 DIAGNOSIS — M25612 Stiffness of left shoulder, not elsewhere classified: Secondary | ICD-10-CM | POA: Diagnosis not present

## 2021-01-17 DIAGNOSIS — M25512 Pain in left shoulder: Secondary | ICD-10-CM | POA: Diagnosis not present

## 2021-01-20 DIAGNOSIS — M25512 Pain in left shoulder: Secondary | ICD-10-CM | POA: Diagnosis not present

## 2021-01-20 DIAGNOSIS — M25612 Stiffness of left shoulder, not elsewhere classified: Secondary | ICD-10-CM | POA: Diagnosis not present

## 2021-01-22 DIAGNOSIS — M25612 Stiffness of left shoulder, not elsewhere classified: Secondary | ICD-10-CM | POA: Diagnosis not present

## 2021-01-22 DIAGNOSIS — M25512 Pain in left shoulder: Secondary | ICD-10-CM | POA: Diagnosis not present

## 2021-01-27 DIAGNOSIS — M25612 Stiffness of left shoulder, not elsewhere classified: Secondary | ICD-10-CM | POA: Diagnosis not present

## 2021-01-27 DIAGNOSIS — M25512 Pain in left shoulder: Secondary | ICD-10-CM | POA: Diagnosis not present

## 2021-01-29 DIAGNOSIS — M25612 Stiffness of left shoulder, not elsewhere classified: Secondary | ICD-10-CM | POA: Diagnosis not present

## 2021-01-29 DIAGNOSIS — M25512 Pain in left shoulder: Secondary | ICD-10-CM | POA: Diagnosis not present

## 2021-01-29 DIAGNOSIS — M7502 Adhesive capsulitis of left shoulder: Secondary | ICD-10-CM | POA: Diagnosis not present

## 2021-01-29 DIAGNOSIS — S42202D Unspecified fracture of upper end of left humerus, subsequent encounter for fracture with routine healing: Secondary | ICD-10-CM | POA: Diagnosis not present

## 2021-02-03 DIAGNOSIS — Z23 Encounter for immunization: Secondary | ICD-10-CM | POA: Diagnosis not present

## 2021-02-04 DIAGNOSIS — M25512 Pain in left shoulder: Secondary | ICD-10-CM | POA: Diagnosis not present

## 2021-02-04 DIAGNOSIS — M25612 Stiffness of left shoulder, not elsewhere classified: Secondary | ICD-10-CM | POA: Diagnosis not present

## 2021-02-14 DIAGNOSIS — M25612 Stiffness of left shoulder, not elsewhere classified: Secondary | ICD-10-CM | POA: Diagnosis not present

## 2021-02-14 DIAGNOSIS — M25512 Pain in left shoulder: Secondary | ICD-10-CM | POA: Diagnosis not present

## 2021-02-18 DIAGNOSIS — M25612 Stiffness of left shoulder, not elsewhere classified: Secondary | ICD-10-CM | POA: Diagnosis not present

## 2021-02-18 DIAGNOSIS — M25512 Pain in left shoulder: Secondary | ICD-10-CM | POA: Diagnosis not present

## 2021-02-25 DIAGNOSIS — M25512 Pain in left shoulder: Secondary | ICD-10-CM | POA: Diagnosis not present

## 2021-02-25 DIAGNOSIS — M25612 Stiffness of left shoulder, not elsewhere classified: Secondary | ICD-10-CM | POA: Diagnosis not present

## 2021-02-27 DIAGNOSIS — M25612 Stiffness of left shoulder, not elsewhere classified: Secondary | ICD-10-CM | POA: Diagnosis not present

## 2021-02-27 DIAGNOSIS — M25512 Pain in left shoulder: Secondary | ICD-10-CM | POA: Diagnosis not present

## 2021-03-04 DIAGNOSIS — M25612 Stiffness of left shoulder, not elsewhere classified: Secondary | ICD-10-CM | POA: Diagnosis not present

## 2021-03-04 DIAGNOSIS — M25512 Pain in left shoulder: Secondary | ICD-10-CM | POA: Diagnosis not present

## 2021-03-05 ENCOUNTER — Telehealth (INDEPENDENT_AMBULATORY_CARE_PROVIDER_SITE_OTHER): Payer: Medicare Other | Admitting: Medical

## 2021-03-05 VITALS — Temp 101.0°F

## 2021-03-05 DIAGNOSIS — B349 Viral infection, unspecified: Secondary | ICD-10-CM | POA: Diagnosis not present

## 2021-03-05 DIAGNOSIS — J029 Acute pharyngitis, unspecified: Secondary | ICD-10-CM

## 2021-03-05 DIAGNOSIS — U071 COVID-19: Secondary | ICD-10-CM | POA: Diagnosis not present

## 2021-03-05 DIAGNOSIS — R509 Fever, unspecified: Secondary | ICD-10-CM

## 2021-03-05 MED ORDER — BENZONATATE 100 MG PO CAPS: 100.0000 mg | ORAL_CAPSULE | Freq: Three times a day (TID) | ORAL | 0 refills | Status: DC | PRN

## 2021-03-05 MED ORDER — AZITHROMYCIN 250 MG PO TABS: ORAL_TABLET | ORAL | 0 refills | Status: AC

## 2021-03-05 NOTE — Patient Instructions (Addendum)
Patient with sore throat, fever and myalgias.  Strep throat versus viral syndrome.  Patient expressing significant doubt that she has either COVID or the flu.  Expresses that she had COVID first year of pandemic before vaccines were out.  She was hospitalized and had significant illness.  Compares this illness to that and I doubt COVID.  Also states does not feel like she has the flu.  Patient lives about 40 minutes from our office or getting flu test that impractical.   I decided to prescribe azithromycin to cover for strep.  Prescribed benzonatate for cough and Flonase for nasal congestion.  Did recommend testing for COVID today and again on Friday morning.  If you get positive test let me know as I can give antiviral medication within first 5 days of symptom onset.  Follow-up in 5 to 7 days or sooner if needed.  For fever and body aches can use Tylenol over-the-counter.

## 2021-03-05 NOTE — Progress Notes (Signed)
Subjective:    Patient ID: Bonnie Brady, female    DOB: 03/11/1951, 70 y.o.   MRN: [id number]  HPI  Virtual Visit via Video Note  I connected with Bonnie Brady on 03/05/21 at  9:40 AM EST by a video enabled telemedicine application and verified that I am speaking with the correct person using two identifiers.  Location: Patient: home(in [place] so getting  Provider: home   I discussed the limitations of evaluation and management by telemedicine and the availability of in person appointments. The patient expressed understanding and agreed to proceed.  History of Present Illness: Pt has some recent faint pnd from sinus and one day of st. Pt has some white dc. She state has no tonsils.   Pt had strep in past 1-2 times in remote past.  Pt has no neck stiffness and no enlarged neck lymph nodes.  Mild nasal congestion. Fever of 101.6.   Some bodyaches all over.   Pt had 2 covid vaccines. Pt had covid 1st year before.     Pt had flu vaccine.   Observations/Objective: General-no acute distress, pleasant, oriented. Lungs- on inspection lungs appear unlabored. Neck- no tracheal deviation or jvd on inspection. Neuro- gross motor function appears intact.   Assessment and Plan: Patient Instructions  Patient with sore throat, fever and myalgias.  Strep throat versus viral syndrome.  Patient expressing significant doubt that she has either COVID or the flu.  Expresses that she had COVID first year of pandemic before vaccines were out.  She was hospitalized and had significant illness.  Compares this illness to that and I doubt COVID.  Also states does not feel like she has the flu.  Patient lives about 40 minutes from our office or getting flu test that impractical.   I decided to prescribe azithromycin to cover for strep.  Prescribed benzonatate for cough and Flonase for nasal congestion.  Did recommend testing for COVID today and again on Friday morning.  If you get  positive test let me know as I can give antiviral medication within first 5 days of symptom onset.  Follow-up in 5 to 7 days or sooner if needed.    Mackie Pai, PA-C   Follow Up Instructions:    I discussed the assessment and treatment plan with the patient. The patient was provided an opportunity to ask questions and all were answered. The patient agreed with the plan and demonstrated an understanding of the instructions.   The patient was advised to call back or seek an in-person evaluation if the symptoms worsen or if the condition fails to improve as anticipated.  Time spent with patient today was  25 minutes which consisted of chart revdiew, discussing diagnosis, work up treatment and documentation.    Mackie Pai, PA-C   Review of Systems  Constitutional:  Positive for fever. Negative for chills and fatigue.  HENT:  Positive for congestion and sore throat. Negative for ear pain, sinus pressure and trouble swallowing.   Respiratory:  Positive for cough. Negative for chest tightness and wheezing.   Cardiovascular:  Negative for chest pain and palpitations.  Gastrointestinal:  Negative for abdominal pain.  Genitourinary:  Negative for dysuria.  Musculoskeletal:  Negative for back pain and neck stiffness.       Mild body aches.  Neurological:  Negative for dizziness, speech difficulty, weakness and numbness.  Hematological:  Negative for adenopathy. Does not bruise/bleed easily.  Psychiatric/Behavioral:  Negative for behavioral problems and confusion.  Past Medical History:  Diagnosis Date   Borderline diabetes    COVID-19 10/2018   Hyperlipidemia    Kidney stone    Osteopenia    Tachycardia    normal cardiology workup, no palpitations in 5-6 yrs   Varicose veins      Social History   Socioeconomic History   Marital status: Married    Spouse name: Not on file   Number of children: Not on file   Years of education: Not on file   Highest education level:  Not on file  Occupational History   Not on file  Tobacco Use   Smoking status: Former    Types: Cigarettes    Quit date: 03/30/2008    Years since quitting: 12.9   Smokeless tobacco: Never  Vaping Use   Vaping Use: Not on file  Substance and Sexual Activity   Alcohol use: Yes    Comment: occasional    Drug use: No   Sexual activity: Not on file  Other Topics Concern   Not on file  Social History Narrative   Has worked as Psychologist, 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    Married   2 children   Enjoys Airline pilot      Social Determinants of Health   Financial Resource Strain: Not on file  Food Insecurity: Not on file  Transportation Needs: Not on file  Physical Activity: Not on file  Stress: Not on file  Social Connections: Not on file  Intimate Partner Violence: Not on file    Past Surgical History:  Procedure Laterality Date   ABDOMINAL HYSTERECTOMY     APPENDECTOMY  1971   bladder strap  2006   [place] of veins  2010   also in 2016 and 2017   MASS EXCISION Right 03/13/2020   Procedure: EXCISION OF RIGHT HIP MASS;  Surgeon: Rolm Bookbinder, MD;  Location: [place];  Service: General;  Laterality: Right;   TONSILLECTOMY  1958    Family History  Problem Relation Age of Onset   Breast cancer Paternal Grandmother    Stroke Mother    Stroke Maternal Grandfather    COPD Father    COPD Brother    Breast cancer Daughter     Allergies  Allergen Reactions   Ampicillin Hives    Questionable. Had a rash   Latex Other (See Comments)    Contact with powder.   Tetanus Toxoids Other (See Comments)    Swelling / redness   Nickel Rash    Blisters    Current Outpatient Medications on File Prior to Visit  Medication Sig Dispense Refill   amoxicillin-clavulanate (AUGMENTIN) 875-125 MG tablet Take 1 tablet by mouth 2 (two) times daily. 20 tablet 0   Estradiol-Estriol-Progesterone  (BIEST/PROGESTERONE) CREA Place onto the skin.     Nutritional Supplements (JUICE PLUS FIBRE PO) Take by mouth.     rosuvastatin (CRESTOR) 10 MG tablet Take 1 tablet (10 mg total) by mouth daily. 90 tablet 1   Zoster Vaccine Adjuvanted [facility]) injection Inject 0.5mg  IM now and again in 2-6 months. 0.5 mL 1   No current facility-administered medications on file prior to visit.    Temp (!) 101 F (38.3 C)       Objective:   Physical Exam        Assessment & Plan:

## 2021-03-10 DIAGNOSIS — M25512 Pain in left shoulder: Secondary | ICD-10-CM | POA: Diagnosis not present

## 2021-03-10 DIAGNOSIS — M25612 Stiffness of left shoulder, not elsewhere classified: Secondary | ICD-10-CM | POA: Diagnosis not present

## 2021-03-11 DIAGNOSIS — S42202D Unspecified fracture of upper end of left humerus, subsequent encounter for fracture with routine healing: Secondary | ICD-10-CM | POA: Diagnosis not present

## 2021-04-03 DIAGNOSIS — M25512 Pain in left shoulder: Secondary | ICD-10-CM | POA: Diagnosis not present

## 2021-04-03 DIAGNOSIS — M25612 Stiffness of left shoulder, not elsewhere classified: Secondary | ICD-10-CM | POA: Diagnosis not present

## 2021-04-07 DIAGNOSIS — Z1231 Encounter for screening mammogram for malignant neoplasm of breast: Secondary | ICD-10-CM | POA: Diagnosis not present

## 2021-04-08 DIAGNOSIS — M25512 Pain in left shoulder: Secondary | ICD-10-CM | POA: Diagnosis not present

## 2021-04-08 DIAGNOSIS — M25612 Stiffness of left shoulder, not elsewhere classified: Secondary | ICD-10-CM | POA: Diagnosis not present

## 2021-04-15 DIAGNOSIS — M25612 Stiffness of left shoulder, not elsewhere classified: Secondary | ICD-10-CM | POA: Diagnosis not present

## 2021-04-15 DIAGNOSIS — M25512 Pain in left shoulder: Secondary | ICD-10-CM | POA: Diagnosis not present

## 2021-04-25 DIAGNOSIS — M25612 Stiffness of left shoulder, not elsewhere classified: Secondary | ICD-10-CM | POA: Diagnosis not present

## 2021-04-25 DIAGNOSIS — M25512 Pain in left shoulder: Secondary | ICD-10-CM | POA: Diagnosis not present

## 2021-04-29 DIAGNOSIS — M25512 Pain in left shoulder: Secondary | ICD-10-CM | POA: Diagnosis not present

## 2021-04-29 DIAGNOSIS — M25612 Stiffness of left shoulder, not elsewhere classified: Secondary | ICD-10-CM | POA: Diagnosis not present

## 2021-04-30 DIAGNOSIS — Z90710 Acquired absence of both cervix and uterus: Secondary | ICD-10-CM | POA: Diagnosis not present

## 2021-04-30 DIAGNOSIS — Z01419 Encounter for gynecological examination (general) (routine) without abnormal findings: Secondary | ICD-10-CM | POA: Diagnosis not present

## 2021-04-30 DIAGNOSIS — R32 Unspecified urinary incontinence: Secondary | ICD-10-CM | POA: Diagnosis not present

## 2021-05-06 DIAGNOSIS — M25512 Pain in left shoulder: Secondary | ICD-10-CM | POA: Diagnosis not present

## 2021-05-06 DIAGNOSIS — M25612 Stiffness of left shoulder, not elsewhere classified: Secondary | ICD-10-CM | POA: Diagnosis not present

## 2021-05-14 ENCOUNTER — Other Ambulatory Visit: Payer: Self-pay | Admitting: Family

## 2021-05-14 DIAGNOSIS — M25512 Pain in left shoulder: Secondary | ICD-10-CM | POA: Diagnosis not present

## 2021-05-14 DIAGNOSIS — M25612 Stiffness of left shoulder, not elsewhere classified: Secondary | ICD-10-CM | POA: Diagnosis not present

## 2021-05-21 DIAGNOSIS — M25512 Pain in left shoulder: Secondary | ICD-10-CM | POA: Diagnosis not present

## 2021-05-21 DIAGNOSIS — M25612 Stiffness of left shoulder, not elsewhere classified: Secondary | ICD-10-CM | POA: Diagnosis not present

## 2021-05-26 DIAGNOSIS — M25612 Stiffness of left shoulder, not elsewhere classified: Secondary | ICD-10-CM | POA: Diagnosis not present

## 2021-05-26 DIAGNOSIS — M25512 Pain in left shoulder: Secondary | ICD-10-CM | POA: Diagnosis not present

## 2021-05-28 DIAGNOSIS — D239 Other benign neoplasm of skin, unspecified: Secondary | ICD-10-CM | POA: Diagnosis not present

## 2021-05-28 DIAGNOSIS — L570 Actinic keratosis: Secondary | ICD-10-CM | POA: Diagnosis not present

## 2021-05-29 ENCOUNTER — Emergency Department (HOSPITAL_COMMUNITY): Payer: Medicare Other

## 2021-05-29 ENCOUNTER — Other Ambulatory Visit: Payer: Self-pay

## 2021-05-29 ENCOUNTER — Encounter (HOSPITAL_COMMUNITY): Payer: Self-pay

## 2021-05-29 ENCOUNTER — Emergency Department (HOSPITAL_COMMUNITY)
Admission: EM | Admit: 2021-05-29 | Discharge: 2021-05-29 | Disposition: A | Discharge status: Home / Self Care | Payer: Medicare Other | Attending: Emergency Medicine | Admitting: Emergency Medicine

## 2021-05-29 DIAGNOSIS — R0789 Other chest pain: Secondary | ICD-10-CM | POA: Diagnosis not present

## 2021-05-29 DIAGNOSIS — Z79899 Other long term (current) drug therapy: Secondary | ICD-10-CM | POA: Diagnosis not present

## 2021-05-29 DIAGNOSIS — Z91040 Latex allergy status: Secondary | ICD-10-CM | POA: Insufficient documentation

## 2021-05-29 DIAGNOSIS — R079 Chest pain, unspecified: Secondary | ICD-10-CM | POA: Insufficient documentation

## 2021-05-29 DIAGNOSIS — Z7982 Long term (current) use of aspirin: Secondary | ICD-10-CM | POA: Diagnosis not present

## 2021-05-29 LAB — BASIC METABOLIC PANEL
Anion gap: 11 (ref 5–15)
BUN: 18 mg/dL (ref 8–23)
CO2: 24 mmol/L (ref 22–32)
Calcium: 9.5 mg/dL (ref 8.9–10.3)
Chloride: 107 mmol/L (ref 98–111)
Creatinine, Ser: 1.05 mg/dL — ABNORMAL HIGH (ref 0.44–1.00)
GFR, Estimated: 57 mL/min — ABNORMAL LOW (ref >60)
Glucose, Bld: 108 mg/dL — ABNORMAL HIGH (ref 70–99)
Potassium: 3.3 mmol/L — ABNORMAL LOW (ref 3.5–5.1)
Sodium: 142 mmol/L (ref 135–145)

## 2021-05-29 LAB — CBC
HCT: 41.1 % (ref 36.0–46.0)
Hemoglobin: 13.8 g/dL (ref 12.0–15.0)
MCH: 32.4 pg (ref 26.0–34.0)
MCHC: 33.6 g/dL (ref 30.0–36.0)
MCV: 96.5 fL (ref 80.0–100.0)
Platelets: 223 10*3/uL (ref 150–400)
RBC: 4.26 MIL/uL (ref 3.87–5.11)
RDW: 12.8 % (ref 11.5–15.5)
WBC: 8.9 10*3/uL (ref 4.0–10.5)
nRBC: 0 % (ref 0.0–0.2)

## 2021-05-29 LAB — TROPONIN I (HIGH SENSITIVITY)
Troponin I (High Sensitivity): 5 ng/L (ref <18)
Troponin I (High Sensitivity): 7 ng/L (ref <18)

## 2021-05-29 NOTE — ED Triage Notes (Signed)
Reports left sided chest pain with no radiation beginning around 0130 while getting ready for bed. Took 650mg  ASA pta.  ?

## 2021-05-29 NOTE — ED Provider Notes (Signed)
?[facility] ?Provider Note ? ? ?CSN: [id number] ?Arrival date & time: 05/29/21  0263 ? ?  ? ?History ? ?Chief Complaint  ?Patient presents with  ? Chest Pain  ? ? ?KINSLEIGH LUDOLPH is a 71 y.o. female. ? ?Patient presents with left-sided chest pain constant since yesterday evening.  Worse with position.  Patient denies fevers, cough, syncope, pleuritic component, shortness of breath, diaphoresis, vomiting or radiation.  Patient took aspirin prior to arrival.  Patient had reassuring cardiac work-up with Dr. Alvester Chou approximately 6 years ago.  No history of ACS.  Patient is non-smoker.  Cholesterol history.  Symptoms currently mild.  No recent exertional component. ? ? ?  ? ?Home Medications ?Prior to Admission medications   ?Medication Sig Start Date End Date Taking? Authorizing Provider  ?aspirin 325 MG tablet Take 650 mg by mouth daily as needed for mild pain.   Yes [provider]  ?Estradiol-Estriol-Progesterone (BIEST/PROGESTERONE) CREA Place 1 application onto the skin daily.   Yes [provider]  ?naproxen sodium (ALEVE) 220 MG tablet Take 220 mg by mouth 2 (two) times daily as needed (pain).   Yes [provider]  ?Nutritional Supplements (JUICE PLUS FIBRE PO) Take 4 tablets by mouth in the morning and at bedtime.   Yes [provider]  ?Omega-3 Fatty Acids (OMEGA 3 PO) Take 1 capsule by mouth in the morning and at bedtime.   Yes [provider]  ?rosuvastatin (CRESTOR) 10 MG tablet TAKE 1 TABLET BY MOUTH EVERY DAY ?Patient taking differently: Take 10 mg by mouth at bedtime. 05/14/21  Yes [name], NP  ?Tetrahydrozoline HCl (VISINE OP) Place 1 drop into both eyes daily as needed (dry eyes).   Yes [provider]  ?benzonatate (TESSALON) 100 MG capsule Take 1 capsule (100 mg total) by mouth 3 (three) times daily as needed for cough. ?Patient not taking: Reported on 05/29/2021 03/05/21   Saguier, Percell Miller, PA-C  ?    ? ?Allergies    ?Latex, Tetanus toxoids, and Nickel   ? ?Review of Systems   ?Review of Systems  ?Constitutional:  Negative for chills and fever.  ?HENT:  Negative for congestion.   ?Eyes:  Negative for visual disturbance.  ?Respiratory:  Negative for shortness of breath.   ?Cardiovascular:  Positive for chest pain.  ?Gastrointestinal:  Negative for abdominal pain and vomiting.  ?Genitourinary:  Negative for dysuria and flank pain.  ?Musculoskeletal:  Negative for back pain, neck pain and neck stiffness.  ?Skin:  Negative for rash.  ?Neurological:  Negative for light-headedness and headaches.  ? ?Physical Exam ?Updated Vital Signs ?BP 129/77 (BP Location: Left Arm)   Pulse 74   Temp 97.8 ?F (36.6 ?C) (Oral)   Resp 18   Ht 5\' 6"  (1.676 m)   Wt 79.4 kg   SpO2 100%   BMI 28.25 kg/m?  ?Physical Exam ?Vitals and nursing note reviewed.  ?Constitutional:   ?   General: She is not in acute distress. ?   Appearance: She is well-developed.  ?HENT:  ?   Head: Normocephalic and atraumatic.  ?   Mouth/Throat:  ?   Mouth: Mucous membranes are moist.  ?Eyes:  ?   General:     ?   Right eye: No discharge.     ?   Left eye: No discharge.  ?   Conjunctiva/sclera: Conjunctivae normal.  ?Neck:  ?   Trachea: No tracheal deviation.  ?Cardiovascular:  ?   Rate  and Rhythm: Normal rate and regular rhythm.  ?   Heart sounds: No murmur heard. ?Pulmonary:  ?   Effort: Pulmonary effort is normal.  ?   Breath sounds: Normal breath sounds.  ?Abdominal:  ?   General: There is no distension.  ?   Palpations: Abdomen is soft.  ?   Tenderness: There is no abdominal tenderness. There is no guarding.  ?Musculoskeletal:  ?   Cervical back: Normal range of motion and neck supple. No rigidity.  ?   Right lower leg: No tenderness. No edema.  ?   Left lower leg: No tenderness. No edema.  ?Skin: ?   General: Skin is warm.  ?   Capillary Refill: Capillary refill takes less than 2 seconds.  ?   Findings: No rash.  ?Neurological:  ?   General: No focal  deficit present.  ?   Mental Status: She is alert.  ?   Cranial Nerves: No cranial nerve deficit.  ?Psychiatric:     ?   Mood and Affect: Mood normal.  ? ? ?ED Results / Procedures / Treatments   ?Labs ?(all labs ordered are listed, but only abnormal results are displayed) ?Labs Reviewed  ?BASIC METABOLIC PANEL - Abnormal; Notable for the following components:  ?    Result Value  ? Potassium 3.3 (*)   ? Glucose, Bld 108 (*)   ? Creatinine, Ser 1.05 (*)   ? GFR, Estimated 57 (*)   ? All other components within normal limits  ?CBC  ?TROPONIN I (HIGH SENSITIVITY)  ?TROPONIN I (HIGH SENSITIVITY)  ? ? ?EKG ?EKG Interpretation ? ?Date/Time:  Thursday May 29 2021 03:48:13 EST ?Ventricular Rate:  73 ?PR Interval:  158 ?QRS Duration: 100 ?QT Interval:  408 ?QTC Calculation: 449 ?R Axis:   -25 ?Text Interpretation: Normal sinus rhythm Left axis deviation Abnormal ECG When compared with ECG of 21-Nov-2018 00:45, PREVIOUS ECG IS PRESENT overall similar previous Confirmed by Elnora Morrison [id number]) on 05/29/2021 11:16:36 AM ? ?Radiology ?DG Chest 2 View ? ?Result Date: 05/29/2021 ?CLINICAL DATA:  Chest pain. EXAM: CHEST - 2 VIEW COMPARISON:  12/22/2018. FINDINGS: The heart size and mediastinal contours are within normal limits. Interstitial prominence is unchanged. Both lungs are clear. No acute osseous abnormality. IMPRESSION: No active cardiopulmonary disease. Electronically Signed   By: Brett Fairy M.D.   On: 05/29/2021 04:10   ? ?Procedures ?Procedures  ? ? ?Medications Ordered in ED ?Medications - No data to display ? ?ED Course/ Medical Decision Making/ A&P ?  ?                        ?Medical Decision Making ?Amount and/or Complexity of Data Reviewed ?Labs: ordered. ?Radiology: ordered. ? ? ?Patient presents with left-sided chest discomfort differential includes ACS, pulm embolism, musculoskeletal, reflux, esophageal spasm, pericarditis, other.  Patient symptoms have improved significantly with observation and waiting in  the ER.  Patient is EKG no signs of new ischemia, overall similar to previous.  Blood work ordered and reviewed 2 troponins negative, mild potassium low 3.3, electrolytes otherwise unremarkable. ?Chest x-ray reviewed independently no signs of significant pleural effusion or cardiomegaly. ?Reviewed medical records from 2017 and 2020 when patient had stress test, echo and EKG.  EKG overall similar and stress test unremarkable normal ejection fraction. ? ?Discussed with cardiology coordinator who will help arrange follow-up/stress test for this patient.  Patient comfortable this plan.  Discussed this with patient's husband in the room as  well. ? ? ? ? ? ? ? ? ?Final Clinical Impression(s) / ED Diagnoses ?Final diagnoses:  ?Acute chest pain  ? ? ?Rx / DC Orders ?ED Discharge Orders   ? ? None  ? ?  ? ? ?  ?Elnora Morrison, MD ?05/29/21 1215 ? ?

## 2021-05-29 NOTE — Discharge Instructions (Addendum)
Return for persistent new or worsening signs or symptoms. ?Follow-up to arrange stress test.  Cardiology should call you. ?

## 2021-05-29 NOTE — ED Provider Triage Note (Signed)
Emergency Medicine Provider Triage Evaluation Note ? ?[name] , a 71 y.o. female  was evaluated in triage.  Pt complains of chest pain onset tonight. Pain has been constant, described as "just a pain". Pain is nonradiating, intermittent, waxing/waning. Aggravated when lying flat and alleviated with sitting upright. Denies fever, syncope, diaphoresis, vomiting, arm pain. Took 2 tablets of 325mg  ASA prior to arrival. Had reassuring cardiac work up with Dr. Gwenlyn Found ~10 years ago, per patient. No hx of ACS. Nonsmoker. ? ?Review of Systems  ?Positive: As above ?Negative: As above ? ?Physical Exam  ?BP (!) 160/93 (BP Location: Right Arm)   Pulse 78   Temp 98 ?F (36.7 ?C)   Resp 18   SpO2 100%  ?Gen:   Awake, no distress   ?Resp:  Normal effort  ?MSK:   Moves extremities without difficulty  ?Other:  Lungs CTAB. Heart RRR.  ? ?Medical Decision Making  ?Medically screening exam initiated at 3:55 AM.  Appropriate orders placed.  WAFA MARTES was informed that the remainder of the evaluation will be completed by another provider, this initial triage assessment does not replace that evaluation, and the importance of remaining in the ED until their evaluation is complete. ? ?Chest pain - plan for CXR, labs, EKG ?  ?Antonietta Breach, PA-C ?05/29/21 8871 ? ?

## 2021-06-03 DIAGNOSIS — M25612 Stiffness of left shoulder, not elsewhere classified: Secondary | ICD-10-CM | POA: Diagnosis not present

## 2021-06-03 DIAGNOSIS — M25512 Pain in left shoulder: Secondary | ICD-10-CM | POA: Diagnosis not present

## 2021-06-04 ENCOUNTER — Ambulatory Visit: Payer: Medicare Other | Admitting: Internal Medicine

## 2021-06-09 DIAGNOSIS — M25512 Pain in left shoulder: Secondary | ICD-10-CM | POA: Diagnosis not present

## 2021-06-09 DIAGNOSIS — M25612 Stiffness of left shoulder, not elsewhere classified: Secondary | ICD-10-CM | POA: Diagnosis not present

## 2021-06-13 DIAGNOSIS — H1045 Other chronic allergic conjunctivitis: Secondary | ICD-10-CM | POA: Diagnosis not present

## 2021-06-16 DIAGNOSIS — M25612 Stiffness of left shoulder, not elsewhere classified: Secondary | ICD-10-CM | POA: Diagnosis not present

## 2021-06-16 DIAGNOSIS — M25512 Pain in left shoulder: Secondary | ICD-10-CM | POA: Diagnosis not present

## 2021-06-17 ENCOUNTER — Ambulatory Visit: Payer: Medicare Other | Admitting: Cardiovascular Disease

## 2021-06-23 DIAGNOSIS — M25512 Pain in left shoulder: Secondary | ICD-10-CM | POA: Diagnosis not present

## 2021-06-23 DIAGNOSIS — M25612 Stiffness of left shoulder, not elsewhere classified: Secondary | ICD-10-CM | POA: Diagnosis not present

## 2021-06-25 DIAGNOSIS — Z20822 Contact with and (suspected) exposure to covid-19: Secondary | ICD-10-CM | POA: Diagnosis not present

## 2021-06-27 ENCOUNTER — Encounter: Payer: Self-pay | Admitting: Cardiovascular Disease

## 2021-06-27 ENCOUNTER — Ambulatory Visit (INDEPENDENT_AMBULATORY_CARE_PROVIDER_SITE_OTHER): Payer: Medicare Other | Admitting: Cardiovascular Disease

## 2021-06-27 VITALS — BP 127/68 | HR 79 | Ht 66.5 in | Wt 175.8 lb

## 2021-06-27 DIAGNOSIS — E7800 Pure hypercholesterolemia, unspecified: Secondary | ICD-10-CM | POA: Diagnosis not present

## 2021-06-27 DIAGNOSIS — R9431 Abnormal electrocardiogram [ECG] [EKG]: Secondary | ICD-10-CM

## 2021-06-27 DIAGNOSIS — R0789 Other chest pain: Secondary | ICD-10-CM | POA: Diagnosis not present

## 2021-06-27 NOTE — Assessment & Plan Note (Signed)
History of hyperlipidemia on statin therapy with lipid profile performed 11/22/2020 revealed a total cholesterol 179, LDL 92 HDL 47 ?

## 2021-06-27 NOTE — Patient Instructions (Signed)
Medication Instructions:  ?Your physician recommends that you continue on your current medications as directed. Please refer to the Current Medication list given to you today. ? ?*If you need a refill on your cardiac medications before your next appointment, please call your pharmacy* ? ? ?Testing/Procedures: ?Dr. Gwenlyn Found has ordered a CT coronary calcium score.  ? ?Test locations:  ?[facility] ([address] [place], [place] [address]) ?[facility] ([address] [place], [place] [address])  ? ?This is $99 out of pocket. ? ? ?Coronary CalciumScan ?A coronary calcium scan is an imaging test used to look for deposits of calcium and other fatty materials (plaques) in the inner lining of the blood vessels of the heart (coronary arteries). These deposits of calcium and plaques can partly clog and narrow the coronary arteries without producing any symptoms or warning signs. This puts a person at risk for a heart attack. This test can detect these deposits before symptoms develop. ?Tell a health care provider about: ?Any allergies you have. ?All medicines you are taking, including vitamins, herbs, eye drops, creams, and over-the-counter medicines. ?Any problems you or family members have had with anesthetic medicines. ?Any blood disorders you have. ?Any surgeries you have had. ?Any medical conditions you have. ?Whether you are pregnant or may be pregnant. ?What are the risks? ?Generally, this is a safe procedure. However, problems may occur, including: ?Harm to a pregnant woman and her unborn baby. This test involves the use of radiation. Radiation exposure can be dangerous to a pregnant woman and her unborn baby. If you are pregnant, you generally should not have this procedure done. ?Slight increase in the risk of cancer. This is because of the radiation involved in the test. ?What happens before the procedure? ?No preparation is needed for this procedure. ?What happens during the procedure? ?You will  undress and remove any jewelry around your neck or chest. ?You will put on a hospital gown. ?Sticky electrodes will be placed on your chest. The electrodes will be connected to an electrocardiogram (ECG) machine to record a tracing of the electrical activity of your heart. ?A CT scanner will take pictures of your heart. During this time, you will be asked to lie still and hold your breath for 2-3 seconds while a picture of your heart is being taken. ?The procedure may vary among health care providers and hospitals. ?What happens after the procedure? ?You can get dressed. ?You can return to your normal activities. ?It is up to you to get the results of your test. Ask your health care provider, or the department that is doing the test, when your results will be ready. ?Summary ?A coronary calcium scan is an imaging test used to look for deposits of calcium and other fatty materials (plaques) in the inner lining of the blood vessels of the heart (coronary arteries). ?Generally, this is a safe procedure. Tell your health care provider if you are pregnant or may be pregnant. ?No preparation is needed for this procedure. ?A CT scanner will take pictures of your heart. ?You can return to your normal activities after the scan is done. ?This information is not intended to replace advice given to you by your health care provider. Make sure you discuss any questions you have with your health care provider. ?Document Released: 09/12/2007 Document Revised: 02/03/2016 Document Reviewed: 02/03/2016 ?Elsevier Interactive Patient Education ? 2017 Elsevier Inc. ? ? ? ?Follow-Up: ?At [facility], you and your health needs are our priority.  As part of our  continuing mission to provide you with exceptional heart care, we have created designated Provider Care Teams.  These Care Teams include your primary Cardiologist (physician) and Advanced Practice Providers (APPs -  Physician Assistants and Nurse Practitioners) who all work  together to provide you with the care you need, when you need it. ? ?We recommend signing up for the patient portal called "MyChart".  Sign up information is provided on this After Visit Summary.  MyChart is used to connect with patients for Virtual Visits (Telemedicine).  Patients are able to view lab/test results, encounter notes, upcoming appointments, etc.  Non-urgent messages can be sent to your provider as well.   ?To learn more about what you can do with MyChart, go to [email or web address].   ? ?Your next appointment:   ?We will see you on an as needed basis. ? ?Provider:   ?Quay Burow, MD ?

## 2021-06-27 NOTE — Assessment & Plan Note (Signed)
Patient was seen in the ER on 05/29/2021 with atypical chest pain.  Her enzymes were flat.  The pain resolved spontaneously.  She did have a Myoview stress test performed 01/28/2016 which was low risk and nonischemic.  She had no recurrent symptoms.  I am going to get a coronary calcium score to further evaluate ?

## 2021-06-27 NOTE — Progress Notes (Signed)
? ? ? ?06/27/2021 ?[name]   ?07/27/1950  ?[id number] ? ?Primary Physician [name], NP ?Primary Cardiologist: Lorretta Harp MD Lupe Carney, [place] ? ?HPI:  Bonnie Brady is a 71 y.o.  mild to moderately overweight married Caucasian female mother of 2, grandmother and 6 grandchildren whose husband Zenia Resides is also patient of mine. Her daughter Baxter Flattery was a NP who worked with me in the past but unfortunately she passed away from terminal cancer.  She was referred by her PCP, [name], for cardiac evaluation because of new onset tachycardia palpitations and presyncope.  I last saw her in the office 09/11/2016.Marland Kitchen  Her only risk factor is treated hyperlipidemia. She's never had a heart attack or stroke. She denies chest pain or shortness of breath. She did drink several cups of coffee a day and does have a remote history of tobacco abuse having smoked 40 pack years and stopped 7 years ago. Her 2-D echo Myoview stress tests were normal. Her monitor showed no evidence for tachycardia or bradycardia arrhythmias although she did have a short episode of AV dissociation. I did begin her on beta blockers initially and when I saw her back 6 months ago her symptoms have resolved. She has  slowly weaned herself off the beta blocker and remains asymptomatic. ? ?Since I saw her 5 years ago she has done well.  She was seen in the emergency room on 05/29/2021 with atypical chest pain with a negative work-up.  She said no recurrent symptoms.  The pain was somewhat positional.  She has a chronically abnormal EKG. ? ? ?Current Meds  ?Medication Sig  ? Estradiol-Estriol-Progesterone (BIEST/PROGESTERONE) CREA Place 1 application onto the skin daily.  ? Nutritional Supplements (JUICE PLUS FIBRE PO) Take 4 tablets by mouth in the morning and at bedtime.  ? Omega-3 Fatty Acids (OMEGA 3 PO) Take 1 capsule by mouth in the morning and at bedtime.  ? rosuvastatin (CRESTOR) 10 MG tablet TAKE 1 TABLET BY MOUTH EVERY DAY  (Patient taking differently: Take 10 mg by mouth at bedtime.)  ? Tetrahydrozoline HCl (VISINE OP) Place 1 drop into both eyes daily as needed (dry eyes).  ?  ? ?Allergies  ?Allergen Reactions  ? Latex Rash  ?  Contact with powder.  ? Tetanus Toxoids Other (See Comments)  ?  Swelling / redness  ? Nickel Rash  ?  Blisters  ? ? ?Social History  ? ?Socioeconomic History  ? Marital status: Married  ?  Spouse name: Not on file  ? Number of children: Not on file  ? Years of education: Not on file  ? Highest education level: Not on file  ?Occupational History  ? Not on file  ?Tobacco Use  ? Smoking status: Former  ?  Types: Cigarettes  ?  Quit date: 03/30/2008  ?  Years since quitting: 13.2  ? Smokeless tobacco: Never  ?Vaping Use  ? Vaping Use: Not on file  ?Substance and Sexual Activity  ? Alcohol use: Yes  ?  Comment: occasional   ? Drug use: No  ? Sexual activity: Not on file  ?Other Topics Concern  ? Not on file  ?Social History Narrative  ? Has worked as Engineering geologist- stopped working 2011.   ? Married  ? 2 children  ? Enjoys geneology  ? Camping/hiking  ?   ? ?Social Determinants of Health  ? ?Financial Resource Strain: Not on file  ?Food Insecurity: Not on file  ?Transportation  Needs: Not on file  ?Physical Activity: Not on file  ?Stress: Not on file  ?Social Connections: Not on file  ?Intimate Partner Violence: Not on file  ?  ? ?Review of Systems: ?General: negative for chills, fever, night sweats or weight changes.  ?Cardiovascular: negative for chest pain, dyspnea on exertion, edema, orthopnea, palpitations, paroxysmal nocturnal dyspnea or shortness of breath ?Dermatological: negative for rash ?Respiratory: negative for cough or wheezing ?Urologic: negative for hematuria ?Abdominal: negative for nausea, vomiting, diarrhea, bright red blood per rectum, melena, or hematemesis ?Neurologic: negative for visual changes, syncope, or dizziness ?All other systems reviewed and are otherwise negative  except as noted above. ? ? ? ?Blood pressure 127/68, pulse 79, height 5' 6.5" (1.689 m), weight 175 lb 12.8 oz (79.7 kg), SpO2 97 %.  ?General appearance: alert and no distress ?Neck: no adenopathy, no carotid bruit, no JVD, supple, symmetrical, trachea midline, and thyroid not enlarged, symmetric, no tenderness/mass/nodules ?Lungs: clear to auscultation bilaterally ?Heart: regular rate and rhythm, S1, S2 normal, no murmur, click, rub or gallop ?Extremities: extremities normal, atraumatic, no cyanosis or edema ?Pulses: 2+ and symmetric ?Skin: Skin color, texture, turgor normal. No rashes or lesions ?Neurologic: Grossly normal ? ?EKG sinus rhythm at 79 with left intrafascicular block and poor R wave progression.  I personally reviewed this EKG. ? ?ASSESSMENT AND PLAN:  ? ?Abnormal EKG ?Left anterior fascicular block and poor R wave progression which were old ? ?Hyperlipidemia ?History of hyperlipidemia on statin therapy with lipid profile performed 11/22/2020 revealed a total cholesterol 179, LDL 92 HDL 47 ? ?Palpitations ?History of palpitations in the past which are no longer a clinical issue ? ?Atypical chest pain ?Patient was seen in the ER on 05/29/2021 with atypical chest pain.  Her enzymes were flat.  The pain resolved spontaneously.  She did have a Myoview stress test performed 01/28/2016 which was low risk and nonischemic.  She had no recurrent symptoms.  I am going to get a coronary calcium score to further evaluate ? ? ? ? ?Lorretta Harp MD FACP,FACC,FAHA, FSCAI ?06/27/2021 ?4:44 PM ?

## 2021-06-27 NOTE — Assessment & Plan Note (Signed)
Left anterior fascicular block and poor R wave progression which were old ?

## 2021-06-27 NOTE — Assessment & Plan Note (Signed)
History of palpitations in the past which are no longer a clinical issue ?

## 2021-07-03 DIAGNOSIS — M25512 Pain in left shoulder: Secondary | ICD-10-CM | POA: Diagnosis not present

## 2021-07-03 DIAGNOSIS — M25612 Stiffness of left shoulder, not elsewhere classified: Secondary | ICD-10-CM | POA: Diagnosis not present

## 2021-07-17 DIAGNOSIS — M25612 Stiffness of left shoulder, not elsewhere classified: Secondary | ICD-10-CM | POA: Diagnosis not present

## 2021-07-17 DIAGNOSIS — M25512 Pain in left shoulder: Secondary | ICD-10-CM | POA: Diagnosis not present

## 2021-07-24 DIAGNOSIS — Z20822 Contact with and (suspected) exposure to covid-19: Secondary | ICD-10-CM | POA: Diagnosis not present

## 2021-07-24 DIAGNOSIS — M25612 Stiffness of left shoulder, not elsewhere classified: Secondary | ICD-10-CM | POA: Diagnosis not present

## 2021-07-24 DIAGNOSIS — M25512 Pain in left shoulder: Secondary | ICD-10-CM | POA: Diagnosis not present

## 2021-07-29 DIAGNOSIS — M25612 Stiffness of left shoulder, not elsewhere classified: Secondary | ICD-10-CM | POA: Diagnosis not present

## 2021-07-29 DIAGNOSIS — M25512 Pain in left shoulder: Secondary | ICD-10-CM | POA: Diagnosis not present

## 2021-08-14 DIAGNOSIS — M25512 Pain in left shoulder: Secondary | ICD-10-CM | POA: Diagnosis not present

## 2021-08-14 DIAGNOSIS — M25612 Stiffness of left shoulder, not elsewhere classified: Secondary | ICD-10-CM | POA: Diagnosis not present

## 2021-08-18 DIAGNOSIS — M25512 Pain in left shoulder: Secondary | ICD-10-CM | POA: Diagnosis not present

## 2021-08-18 DIAGNOSIS — M25612 Stiffness of left shoulder, not elsewhere classified: Secondary | ICD-10-CM | POA: Diagnosis not present

## 2021-08-21 ENCOUNTER — Ambulatory Visit
Admission: RE | Admit: 2021-08-21 | Discharge: 2021-08-21 | Disposition: A | Discharge status: Home / Self Care | Payer: Self-pay | Source: Ambulatory Visit | Attending: Cardiovascular Disease | Admitting: Cardiovascular Disease

## 2021-08-21 DIAGNOSIS — E7800 Pure hypercholesterolemia, unspecified: Secondary | ICD-10-CM

## 2021-08-21 DIAGNOSIS — R9431 Abnormal electrocardiogram [ECG] [EKG]: Secondary | ICD-10-CM

## 2021-09-04 ENCOUNTER — Other Ambulatory Visit: Payer: Self-pay

## 2021-09-04 ENCOUNTER — Telehealth: Payer: Self-pay | Admitting: Cardiovascular Disease

## 2021-09-04 DIAGNOSIS — E785 Hyperlipidemia, unspecified: Secondary | ICD-10-CM

## 2021-09-04 MED ORDER — ROSUVASTATIN CALCIUM 20 MG PO TABS: 20.0000 mg | ORAL_TABLET | Freq: Every day | ORAL | 3 refills | Status: DC

## 2021-09-04 NOTE — Telephone Encounter (Signed)
Patient calling for CT results. 

## 2021-09-04 NOTE — Telephone Encounter (Addendum)
Called pt to go over CT results and Dr. Kennon Holter recommendations. Crestor 20 mg sent to pharmacy. Orders for repeat labs ordered. Pt verbalized understanding. Pt does want to know if she shpoudl monitor the amount of calcium she consumes. She takes an OTC calcium supplement daily. Will get message to Dr. Gwenlyn Found for review.

## 2021-09-05 NOTE — Telephone Encounter (Signed)
Called pt to relay Dr. Kennon Holter message. She verbalized understanding. No further questions expressed at this time.

## 2021-10-01 ENCOUNTER — Telehealth (INDEPENDENT_AMBULATORY_CARE_PROVIDER_SITE_OTHER): Payer: Medicare Other | Admitting: Family

## 2021-10-01 VITALS — Temp 97.4°F

## 2021-10-01 DIAGNOSIS — B370 Candidal stomatitis: Secondary | ICD-10-CM | POA: Insufficient documentation

## 2021-10-01 DIAGNOSIS — J029 Acute pharyngitis, unspecified: Secondary | ICD-10-CM | POA: Diagnosis not present

## 2021-10-01 MED ORDER — NYSTATIN 100000 UNIT/ML MT SUSP: 5.0000 mL | Freq: Four times a day (QID) | OROMUCOSAL | 0 refills | Status: DC

## 2021-10-01 NOTE — Assessment & Plan Note (Signed)
New. Rx with nystatin suspension.

## 2021-10-01 NOTE — Progress Notes (Signed)
MyChart Video Visit    Virtual Visit via Video Note   This visit type was conducted due to national recommendations for restrictions regarding the COVID-19 Pandemic (e.g. social distancing) in an effort to limit this patient's exposure and mitigate transmission in our community. This patient is at least at moderate risk for complications without adequate follow up. This format is felt to be most appropriate for this patient at this time. Physical exam was limited by quality of the video and audio technology used for the visit. CMA was able to get the patient set up on a video visit.  Patient location: Home Patient and provider in visit Provider location: Office  I discussed the limitations of evaluation and management by telemedicine and the availability of in person appointments. The patient expressed understanding and agreed to proceed.  Visit Date: 10/01/2021  Today's healthcare provider: Nance Pear, NP     Subjective:    Patient ID: Bonnie Brady, female    DOB: 1950-04-18, 71 y.o.   MRN: [id number]  Chief Complaint  Patient presents with   Sore Throat    Complains of sore throat with oral pain.     HPI Patient is in today for a follow up virtual visit.  She complains of mild sore throat. She denies having any pus drainage. She also has white spots on her tongue and the roof of her mouth is sore.    Past Medical History:  Diagnosis Date   Borderline diabetes    COVID-19 10/2018   Hyperlipidemia    Kidney stone    Osteopenia    Tachycardia    normal cardiology workup, no palpitations in 5-6 yrs   Varicose veins     Past Surgical History:  Procedure Laterality Date   ABDOMINAL HYSTERECTOMY     APPENDECTOMY  1971   bladder strap  2006   CESAREAN SECTION  1978 and 1979   laser of veins  2010   also in 2016 and 2017   MASS EXCISION Right 03/13/2020   Procedure: EXCISION OF RIGHT HIP MASS;  Surgeon: Rolm Bookbinder, MD;  Location: [place];  Service: General;  Laterality: Right;   TONSILLECTOMY  1958    Family History  Problem Relation Age of Onset   Breast cancer Paternal Grandmother    Stroke Mother    Stroke Maternal Grandfather    COPD Father    COPD Brother    Breast cancer Daughter     Social History   Socioeconomic History   Marital status: Married    Spouse name: Not on file   Number of children: Not on file   Years of education: Not on file   Highest education level: Not on file  Occupational History   Not on file  Tobacco Use   Smoking status: Former    Types: Cigarettes    Quit date: 03/30/2008    Years since quitting: 13.5   Smokeless tobacco: Never  Vaping Use   Vaping Use: Not on file  Substance and Sexual Activity   Alcohol use: Yes    Comment: occasional    Drug use: No   Sexual activity: Not on file  Other Topics Concern   Not on file  Social History Narrative   Has worked as Psychologist, 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    Married   2 children   Enjoys Airline pilot      Social Determinants of Health   Financial  Resource Strain: Not on file  Food Insecurity: Not on file  Transportation Needs: Not on file  Physical Activity: Not on file  Stress: Not on file  Social Connections: Not on file  Intimate Partner Violence: Not on file    Outpatient Medications Prior to Visit  Medication Sig Dispense Refill   Estradiol-Estriol-Progesterone (BIEST/PROGESTERONE) CREA Place 1 application onto the skin daily.     Nutritional Supplements (JUICE PLUS FIBRE PO) Take 4 tablets by mouth in the morning and at bedtime.     Omega-3 Fatty Acids (OMEGA 3 PO) Take 1 capsule by mouth in the morning and at bedtime.     rosuvastatin (CRESTOR) 20 MG tablet Take 1 tablet (20 mg total) by mouth daily. 90 tablet 3   Tetrahydrozoline HCl (VISINE OP) Place 1 drop into both eyes daily as needed (dry eyes).     No facility-administered medications prior to visit.     Allergies  Allergen Reactions   Latex Rash    Contact with powder.   Tetanus Toxoids Other (See Comments)    Swelling / redness   Nickel Rash    Blisters    Review of Systems  HENT:  Positive for sore throat.        (+)white coating on tongue (+)soreness on roof of mouth (-)pus drainage from throat       Objective:    Physical Exam HENT:     Mouth/Throat:     Comments: White coating on tongue    Temp (!) 97.4 F (36.3 C) (Oral)  Wt Readings from Last 3 Encounters:  06/27/21 175 lb 12.8 oz (79.7 kg)  05/29/21 175 lb (79.4 kg)  11/22/20 171 lb (77.6 kg)    Gen: Awake, alert, no acute distress ENT: white coating on tongue Resp: Breathing is even and non-labored Psych: calm/pleasant demeanor Neuro: Alert and Oriented x 3, + facial symmetry, speech is clear.    Assessment & Plan:   Problem List Items Addressed This Visit       Unprioritized   Sore throat - Primary    Mild denies exudates. Could be viral versus due to post nasal drip or thrush. She is advised to call if symptoms worsen or if symptoms do not improve.       Oral thrush    New. Rx with nystatin suspension.       Relevant Medications   nystatin (MYCOSTATIN) 100000 UNIT/ML suspension      Meds ordered this encounter  Medications   nystatin (MYCOSTATIN) 100000 UNIT/ML suspension    Sig: Take 5 mLs (500,000 Units total) by mouth 4 (four) times daily.    Dispense:  140 mL    Refill:  0    Order Specific Question:   Supervising Provider    Answer:   Penni Homans A [[id number]]    I discussed the assessment and treatment plan with the patient. The patient was provided an opportunity to ask questions and all were answered. The patient agreed with the plan and demonstrated an understanding of the instructions.   The patient was advised to call back or seek an in-person evaluation if the symptoms worsen or if the condition fails to improve as anticipated.  I provided 20 minutes of face-to-face  time during this encounter.   I,Shehryar Multimedia programmer as a Education administrator for [employer], NP.,have documented all relevant documentation on the behalf of Nance Pear, NP,as directed by  Nance Pear, NP while in the presence of Nance Pear,  NP.   Nance Pear, NP [facility] [phone] (phone) [phone] (fax)  [place]

## 2021-10-01 NOTE — Assessment & Plan Note (Signed)
Mild denies exudates. Could be viral versus due to post nasal drip or thrush. She is advised to call if symptoms worsen or if symptoms do not improve.

## 2021-11-11 ENCOUNTER — Other Ambulatory Visit: Payer: Self-pay | Admitting: Family

## 2021-12-08 DIAGNOSIS — E785 Hyperlipidemia, unspecified: Secondary | ICD-10-CM | POA: Diagnosis not present

## 2021-12-09 LAB — LIPID PANEL
Chol/HDL Ratio: 3.6 ratio (ref 0.0–4.4)
Cholesterol, Total: 142 mg/dL (ref 100–199)
HDL: 39 mg/dL — ABNORMAL LOW (ref >39)
LDL Chol Calc (NIH): 66 mg/dL (ref 0–99)
Triglycerides: 229 mg/dL — ABNORMAL HIGH (ref 0–149)
VLDL Cholesterol Cal: 37 mg/dL (ref 5–40)

## 2022-01-02 ENCOUNTER — Ambulatory Visit (INDEPENDENT_AMBULATORY_CARE_PROVIDER_SITE_OTHER): Payer: Medicare Other | Admitting: *Deleted

## 2022-01-02 ENCOUNTER — Encounter: Payer: Self-pay | Admitting: Family

## 2022-01-02 ENCOUNTER — Ambulatory Visit (INDEPENDENT_AMBULATORY_CARE_PROVIDER_SITE_OTHER): Payer: Medicare Other | Admitting: Family

## 2022-01-02 VITALS — BP 125/75 | HR 85 | Ht 66.5 in | Wt 180.2 lb

## 2022-01-02 VITALS — BP 122/72 | HR 75 | Temp 97.9°F | Resp 18 | Ht 66.0 in | Wt 180.5 lb

## 2022-01-02 DIAGNOSIS — E7800 Pure hypercholesterolemia, unspecified: Secondary | ICD-10-CM | POA: Diagnosis not present

## 2022-01-02 DIAGNOSIS — Z0000 Encounter for general adult medical examination without abnormal findings: Secondary | ICD-10-CM

## 2022-01-02 DIAGNOSIS — R7303 Prediabetes: Secondary | ICD-10-CM | POA: Diagnosis not present

## 2022-01-02 LAB — COMPREHENSIVE METABOLIC PANEL
ALT: 19 U/L (ref 0–35)
AST: 22 U/L (ref 0–37)
Albumin: 4.3 g/dL (ref 3.5–5.2)
Alkaline Phosphatase: 76 U/L (ref 39–117)
BUN: 23 mg/dL (ref 6–23)
CO2: 26 mEq/L (ref 19–32)
Calcium: 9.5 mg/dL (ref 8.4–10.5)
Chloride: 108 mEq/L (ref 96–112)
Creatinine, Ser: 1.04 mg/dL (ref 0.40–1.20)
GFR: 54.37 mL/min — ABNORMAL LOW (ref >60.00)
Glucose, Bld: 99 mg/dL (ref 70–99)
Potassium: 4.5 mEq/L (ref 3.5–5.1)
Sodium: 142 mEq/L (ref 135–145)
Total Bilirubin: 0.5 mg/dL (ref 0.2–1.2)
Total Protein: 6.5 g/dL (ref 6.0–8.3)

## 2022-01-02 LAB — HEMOGLOBIN A1C: Hgb A1c MFr Bld: 6.2 % (ref 4.6–6.5)

## 2022-01-02 NOTE — Assessment & Plan Note (Signed)
Lab Results  Component Value Date   CHOL 142 12/08/2021   HDL 39 (L) 12/08/2021   LDLCALC 66 12/08/2021   LDLDIRECT 93.0 02/07/2019   TRIG 229 (H) 12/08/2021   CHOLHDL 3.6 12/08/2021   LDL at goal, continue statin. Will continue crestor '20mg'$ .

## 2022-01-02 NOTE — Assessment & Plan Note (Signed)
Lab Results  Component Value Date   HGBA1C 5.8 12/12/2020   HGBA1C 6.3 04/12/2019   Lab Results  Component Value Date   LDLCALC 66 12/08/2021   CREATININE 1.05 (H) 05/29/2021   Last A1C was improved. Obtain follow up A1C. Continue diabetic diet.

## 2022-01-02 NOTE — Progress Notes (Signed)
Subjective:   Bonnie Brady is a 71 y.o. female who presents for Medicare Annual (Subsequent) preventive examination.  Review of Systems    Defer to PCP Cardiac Risk Factors include: advanced age (>38mn, >>63women);dyslipidemia     Objective:    Today's Vitals   01/02/22 0902  BP: 125/75  Pulse: 85  Weight: 180 lb 3.2 oz (81.7 kg)  Height: 5' 6.5" (1.689 m)   Body mass index is 28.65 kg/m.     01/02/2022    9:11 AM 05/29/2021    3:58 AM 03/13/2020    8:22 AM 03/07/2020   10:00 AM 11/21/2018   11:04 PM 11/20/2018   10:55 PM  Advanced Directives  Does Patient Have a Medical Advance Directive? No No No No No No  Would patient like information on creating a medical advance directive? No - Patient declined No - Patient declined No - Patient declined No - Patient declined No - Patient declined     Current Medications (verified) Outpatient Encounter Medications as of 01/02/2022  Medication Sig   Estradiol-Estriol-Progesterone (BIEST/PROGESTERONE) CREA Place 1 application onto the skin daily.   Nutritional Supplements (JUICE PLUS FIBRE PO) Take 4 tablets by mouth in the morning and at bedtime.   nystatin (MYCOSTATIN) 100000 UNIT/ML suspension Take 5 mLs (500,000 Units total) by mouth 4 (four) times daily.   Omega-3 Fatty Acids (OMEGA 3 PO) Take 1 capsule by mouth in the morning and at bedtime.   rosuvastatin (CRESTOR) 20 MG tablet Take 1 tablet (20 mg total) by mouth daily.   Tetrahydrozoline HCl (VISINE OP) Place 1 drop into both eyes daily as needed (dry eyes).   No facility-administered encounter medications on file as of 01/02/2022.    Allergies (verified) Latex, Tetanus toxoids, and Nickel   History: Past Medical History:  Diagnosis Date   Borderline diabetes    COVID-19 10/2018   Hyperlipidemia    Kidney stone    Osteopenia    Tachycardia    normal cardiology workup, no palpitations in 5-6 yrs   Varicose veins    Past Surgical History:  Procedure Laterality  Date   ABDOMINAL HYSTERECTOMY     APPENDECTOMY  1971   bladder strap  2006   CESAREAN SECTION  1978 and 1979   laser of veins  2010   also in 2016 and 2017   MASS EXCISION Right 03/13/2020   Procedure: EXCISION OF RIGHT HIP MASS;  Surgeon: WRolm Bookbinder MD;  [facility]  Service: General;  Laterality: Right;   TONSILLECTOMY  1958   Family History  Problem Relation Age of Onset   Breast cancer Paternal Grandmother    Stroke Mother    Stroke Maternal Grandfather    COPD Father    COPD Brother    Breast cancer Daughter    Social History   Socioeconomic History   Marital status: Married    Spouse name: Not on file   Number of children: Not on file   Years of education: Not on file   Highest education level: Not on file  Occupational History   Not on file  Tobacco Use   Smoking status: Former    Types: Cigarettes    Quit date: 03/30/2008    Years since quitting: 13.7   Smokeless tobacco: Never  Vaping Use   Vaping Use: Not on file  Substance and Sexual Activity   Alcohol use: Yes    Comment: occasional    Drug use: No  Sexual activity: Not on file  Other Topics Concern   Not on file  Social History Narrative   Has worked as Psychologist, 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    Married   2 children   Enjoys Airline pilot      Social Determinants of Health   Financial Resource Strain: Low Risk  (01/02/2022)   Overall Financial Resource Strain (CARDIA)    Difficulty of Paying Living Expenses: Not hard at all  Food Insecurity: No Food Insecurity (01/02/2022)   Hunger Vital Sign    Worried About Running Out of Food in the Last Year: Never true    Ran Out of Food in the Last Year: Never true  Transportation Needs: No Transportation Needs (01/02/2022)   PRAPARE - Hydrologist (Medical): No    Lack of Transportation (Non-Medical): No  Physical Activity: Insufficiently Active (01/02/2022)    Exercise Vital Sign    Days of Exercise per Week: 7 days    Minutes of Exercise per Session: 20 min  Stress: No Stress Concern Present (01/02/2022)   [place]    Feeling of Stress : Not at all  Social Connections: [place] (01/02/2022)   Social Connection and Isolation Panel [NHANES]    Frequency of Communication with Friends and Family: More than three times a week    Frequency of Social Gatherings with Friends and Family: Three times a week    Attends Religious Services: More than 4 times per year    Active Member of Clubs or Organizations: Yes    Attends Music therapist: More than 4 times per year    Marital Status: Married    Tobacco Counseling Counseling given: Not Answered   Clinical Intake:  Pre-visit preparation completed: Yes  Pain : No/denies pain   How often do you need to have someone help you when you read instructions, pamphlets, or other written materials from your doctor or pharmacy?: 1 - Never  Diabetic? No  Activities of Daily Living    01/02/2022    9:12 AM  In your present state of health, do you have any difficulty performing the following activities:  Hearing? 0  Vision? 0  Difficulty concentrating or making decisions? 0  Walking or climbing stairs? 0  Dressing or bathing? 0  Doing errands, shopping? 0  Preparing Food and eating ? N  Using the Toilet? N  In the past six months, have you accidently leaked urine? Y  Do you have problems with loss of bowel control? N  Managing your Medications? N  Managing your Finances? N  Housekeeping or managing your Housekeeping? N    Patient Care Team: [name], NP as PCP - General (Internal Medicine) Servando Salina, MD as Consulting Physician (Obstetrics and Gynecology) Roseanne Kaufman, MD as Consulting Physician (Orthopedic Surgery) Lorretta Harp, MD as Consulting Physician (Cardiology) Juanita Craver, MD as Consulting Physician (Gastroenterology)  Indicate any recent Medical Services you may have received from other than [facility] providers in the past year (date may be approximate).     Assessment:   This is a routine wellness examination for Bonnie Brady.  Hearing/Vision screen No results found.  Dietary issues and exercise activities discussed: Current Exercise Habits: Home exercise routine, Type of exercise: walking, Time (Minutes): 20, Frequency (Times/Week): 7, Weekly Exercise (Minutes/Week): 140, Intensity: Moderate, Exercise limited by: None identified   Goals Addressed   None    Depression  Screen    01/02/2022    9:12 AM 11/22/2020    9:56 AM 09/15/2019   10:31 AM 09/20/2017    2:50 PM 09/24/2016    4:32 PM  PHQ 2/9 Scores  PHQ - 2 Score 0 0 0 0 0  PHQ- 9 Score    0 0    Fall Risk    01/02/2022    9:11 AM 11/22/2020    9:56 AM 09/15/2019   10:30 AM 09/20/2017    2:08 PM 09/24/2016   11:32 AM  Fall Risk   Falls in the past year? 0 0 0 No No  Number falls in past yr: 0 0 0    Injury with Fall? 0 0 0    Risk for fall due to : No Fall Risks      Follow up Falls evaluation completed        FALL RISK PREVENTION PERTAINING TO THE HOME:  Any stairs in or around the home? Yes  If so, are there any without handrails? No  Home free of loose throw rugs in walkways, pet beds, electrical cords, etc? Yes  Adequate lighting in your home to reduce risk of falls? Yes   ASSISTIVE DEVICES UTILIZED TO PREVENT FALLS:  Life alert? No  Use of a cane, walker or w/c? No  Grab bars in the bathroom? No  Shower chair or bench in shower? No  Elevated toilet seat or a handicapped toilet? No   TIMED UP AND GO:  Was the test performed? Yes .  Length of time to ambulate 10 feet: 5 sec.   Gait steady and fast without use of assistive device  Cognitive Function:    09/24/2016    1:11 PM 09/24/2016   11:56 AM  MMSE - Mini Mental State Exam  Orientation to time 5   Orientation to  time comments  yes  Orientation to Place 5   Registration 3   Attention/ Calculation 5   Recall 3   Language- name 2 objects 2   Language- repeat 1   Language- follow 3 step command 3   Language- read & follow direction 1   Write a sentence 1   Copy design 1   Total score 30         01/02/2022    9:15 AM  6CIT Screen  What Year? 0 points  What month? 0 points  What time? 0 points  Count back from 20 0 points  Months in reverse 0 points  Repeat phrase 0 points  Total Score 0 points    Immunizations Immunization History  Administered Date(s) Administered   Fluad Quad(high Dose 65+) 12/29/2018   Influenza, High Dose Seasonal PF 01/13/2017, 01/12/2020   Influenza, Seasonal, Injecte, Preservative Fre 01/08/2014   Influenza-Unspecified 12/29/2015   Pneumococcal Conjugate-13 09/24/2016   Pneumococcal Polysaccharide-23 10/07/2017   Tdap 03/30/2006   Zoster, Live 11/16/2011    TDAP status: Up to date  Flu Vaccine status: Due, Education has been provided regarding the importance of this vaccine. Advised may receive this vaccine at local pharmacy or Health Dept. Aware to provide a copy of the vaccination record if obtained from local pharmacy or Health Dept. Verbalized acceptance and understanding.  Pneumococcal vaccine status: Up to date  Covid-19 vaccine status: Information provided on how to obtain vaccines.   Qualifies for Shingles Vaccine? Yes   Zostavax completed Yes   Shingrix Completed?: No.    Education has been provided regarding the importance of this vaccine.  Patient has been advised to call insurance company to determine out of pocket expense if they have not yet received this vaccine. Advised may also receive vaccine at local pharmacy or Health Dept. Verbalized acceptance and understanding.  Screening Tests Health Maintenance  Topic Date Due   COVID-19 Vaccine (1) Never done   Hepatitis C Screening  Never done   Zoster Vaccines- Shingrix (1 of 2) Never done    INFLUENZA VACCINE  10/28/2021   MAMMOGRAM  02/19/2022   COLONOSCOPY (Pts 45-86yr Insurance coverage will need to be confirmed)  10/09/2024   Pneumonia Vaccine 70 Years old  Completed   DEXA SCAN  Completed   HPV VACCINES  Aged Out    Health Maintenance  Health Maintenance Due  Topic Date Due   COVID-19 Vaccine (1) Never done   Hepatitis C Screening  Never done   Zoster Vaccines- Shingrix (1 of 2) Never done   INFLUENZA VACCINE  10/28/2021    Colorectal cancer screening: Type of screening: Colonoscopy. Completed 10/10/14. Repeat every 10 years  Mammogram status: Completed 02/20/20. Repeat every year  Bone Density status: Completed 02/20/20. Results reflect: Bone density results: OSTEOPOROSIS. Repeat every 2 years.  Lung Cancer Screening: (Low Dose CT Chest recommended if Age 70-80years, 30 pack-year currently smoking OR have quit w/in 15years.) does not qualify.   Lung Cancer Screening Referral: N/a  Additional Screening:  Hepatitis C Screening: does qualify; Completed N/a  Vision Screening: Recommended annual ophthalmology exams for early detection of glaucoma and other disorders of the eye. Is the patient up to date with their annual eye exam?  Yes  Who is the provider or what is the name of the office in which the patient attends annual eye exams? Dr. WEllin MayhewIf pt is not established with a provider, would they like to be referred to a provider to establish care? No .   Dental Screening: Recommended annual dental exams for proper oral hygiene  Community Resource Referral / Chronic Care Management: CRR required this visit?  No   CCM required this visit?  No      Plan:     I have personally reviewed and noted the following in the patient's chart:   Medical and social history Use of alcohol, tobacco or illicit drugs  Current medications and supplements including opioid prescriptions. Patient is not currently taking opioid prescriptions. Functional ability and  status Nutritional status Physical activity Advanced directives List of other physicians Hospitalizations, surgeries, and ER visits in previous 12 months Vitals Screenings to include cognitive, depression, and falls Referrals and appointments  In addition, I have reviewed and discussed with patient certain preventive protocols, quality metrics, and best practice recommendations. A written personalized care plan for preventive services as well as general preventive health recommendations were provided to patient.     BBeatris Ship CLa Luz  01/02/2022   Nurse Notes: None

## 2022-01-02 NOTE — Progress Notes (Signed)
Subjective:   By signing my name below, I, Carylon Perches, attest that this documentation has been prepared under the direction and in the presence of Karie Chimera, NP 01/02/2022    Patient ID: Bonnie Brady, female    DOB: 03-05-1951, 71 y.o.   MRN: [id number]  Chief Complaint  Patient presents with   Follow-up    HPI Patient is in today for an office visit.  Cholesterol: Patients cholesterol is normal this visit. She is currently taking 20 mg of Crestor  Lab Results  Component Value Date   CHOL 142 12/08/2021   HDL 39 (L) 12/08/2021   LDLCALC 66 12/08/2021   LDLDIRECT 93.0 02/07/2019   TRIG 229 (H) 12/08/2021   CHOLHDL 3.6 12/08/2021    Blood sugar: Her A1c levels are improving since her last visit.. Lab Results  Component Value Date   HGBA1C 5.8 12/12/2020    Immunizations: She is interested in receiving an influenza vaccine at a later date. Health Maintenance Due  Topic Date Due   COVID-19 Vaccine (1) Never done   Hepatitis C Screening  Never done   Zoster Vaccines- Shingrix (1 of 2) Never done   INFLUENZA VACCINE  10/28/2021    Past Medical History:  Diagnosis Date   Borderline diabetes    COVID-19 10/2018   Hyperlipidemia    Kidney stone    Osteopenia    Tachycardia    normal cardiology workup, no palpitations in 5-6 yrs   Varicose veins     Past Surgical History:  Procedure Laterality Date   ABDOMINAL HYSTERECTOMY     APPENDECTOMY  1971   bladder strap  2006   CESAREAN SECTION  1978 and 1979   laser of veins  2010   also in 2016 and 2017   MASS EXCISION Right 03/13/2020   Procedure: EXCISION OF RIGHT HIP MASS;  Surgeon: Rolm Bookbinder, MD;  Location: [facility];  Service: General;  Laterality: Right;   TONSILLECTOMY  1958    Family History  Problem Relation Age of Onset   Breast cancer Paternal Grandmother    Stroke Mother    Stroke Maternal Grandfather    COPD Father    COPD Brother    Breast cancer Daughter      Social History   Socioeconomic History   Marital status: Married    Spouse name: Not on file   Number of children: Not on file   Years of education: Not on file   Highest education level: Not on file  Occupational History   Not on file  Tobacco Use   Smoking status: Former    Types: Cigarettes    Quit date: 03/30/2008    Years since quitting: 13.7   Smokeless tobacco: Never  Vaping Use   Vaping Use: Not on file  Substance and Sexual Activity   Alcohol use: Yes    Comment: occasional    Drug use: No   Sexual activity: Not on file  Other Topics Concern   Not on file  Social History Narrative   Has worked as Psychologist, 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    Married   2 children   Enjoys Airline pilot      Social Determinants of Health   Financial Resource Strain: Low Risk  (01/02/2022)   Overall Financial Resource Strain (CARDIA)    Difficulty of Paying Living Expenses: Not hard at all  Food Insecurity: No Food Insecurity (01/02/2022)   Hunger Vital  Sign    Worried About Charity fundraiser in the Last Year: Never true    [place] in the Last Year: Never true  Transportation Needs: No Transportation Needs (01/02/2022)   PRAPARE - Hydrologist (Medical): No    Lack of Transportation (Non-Medical): No  Physical Activity: Insufficiently Active (01/02/2022)   Exercise Vital Sign    Days of Exercise per Week: 7 days    Minutes of Exercise per Session: 20 min  Stress: No Stress Concern Present (01/02/2022)   [place]    Feeling of Stress : Not at all  Social Connections: [place] (01/02/2022)   Social Connection and Isolation Panel [NHANES]    Frequency of Communication with Friends and Family: More than three times a week    Frequency of Social Gatherings with Friends and Family: Three times a week    Attends Religious Services: More  than 4 times per year    Active Member of Clubs or Organizations: Yes    Attends Archivist Meetings: More than 4 times per year    Marital Status: Married  Human resources officer Violence: Not At Risk (01/02/2022)   Humiliation, Afraid, Rape, and Kick questionnaire    Fear of Current or Ex-Partner: No    Emotionally Abused: No    Physically Abused: No    Sexually Abused: No    Outpatient Medications Prior to Visit  Medication Sig Dispense Refill   Estradiol-Estriol-Progesterone (BIEST/PROGESTERONE) CREA Place 1 application onto the skin daily.     Nutritional Supplements (JUICE PLUS FIBRE PO) Take 4 tablets by mouth in the morning and at bedtime.     Omega-3 Fatty Acids (OMEGA 3 PO) Take 1 capsule by mouth in the morning and at bedtime.     rosuvastatin (CRESTOR) 20 MG tablet Take 1 tablet (20 mg total) by mouth daily. 90 tablet 3   nystatin (MYCOSTATIN) 100000 UNIT/ML suspension Take 5 mLs (500,000 Units total) by mouth 4 (four) times daily. 140 mL 0   Tetrahydrozoline HCl (VISINE OP) Place 1 drop into both eyes daily as needed (dry eyes).     No facility-administered medications prior to visit.    Allergies  Allergen Reactions   Latex Rash    Contact with powder.   Tetanus Toxoids Other (See Comments)    Swelling / redness   Nickel Rash    Blisters    ROS     See HPI  Exam   Physical Exam Constitutional:      General: She is not in acute distress.    Appearance: Normal appearance. She is not ill-appearing.  HENT:     Head: Normocephalic and atraumatic.     Right Ear: External ear normal.     Left Ear: External ear normal.  Eyes:     Extraocular Movements: Extraocular movements intact.     Pupils: Pupils are equal, round, and reactive to light.  Cardiovascular:     Rate and Rhythm: Normal rate and regular rhythm.     Heart sounds: Normal heart sounds. No murmur heard.    No gallop.  Pulmonary:     Effort: Pulmonary effort is normal. No respiratory  distress.     Breath sounds: Normal breath sounds. No wheezing or rales.  Skin:    General: Skin is warm and dry.  Neurological:     Mental Status: She is alert and oriented to person, place, and  time.  Psychiatric:        Mood and Affect: Mood normal.        Behavior: Behavior normal.        Judgment: Judgment normal.     BP 122/72 (BP Location: Left Arm, Patient Position: Sitting, Cuff Size: Normal)   Pulse 75   Temp 97.9 F (36.6 C) (Oral)   Resp 18   Ht '5\' 6"'  (1.676 m)   Wt 180 lb 8 oz (81.9 kg)   SpO2 98%   BMI 29.13 kg/m  Wt Readings from Last 3 Encounters:  01/02/22 180 lb 8 oz (81.9 kg)  01/02/22 180 lb 3.2 oz (81.7 kg)  06/27/21 175 lb 12.8 oz (79.7 kg)       Assessment & Plan:   Problem List Items Addressed This Visit       Unprioritized   Hyperlipidemia    Lab Results  Component Value Date   CHOL 142 12/08/2021   HDL 39 (L) 12/08/2021   LDLCALC 66 12/08/2021   LDLDIRECT 93.0 02/07/2019   TRIG 229 (H) 12/08/2021   CHOLHDL 3.6 12/08/2021  LDL at goal, continue statin. Will continue crestor 63m.       Relevant Orders   Comp Met (CMET)   Borderline diabetes - Primary    Lab Results  Component Value Date   HGBA1C 5.8 12/12/2020   HGBA1C 6.3 04/12/2019   Lab Results  Component Value Date   LDLCALC 66 12/08/2021   CREATININE 1.05 (H) 05/29/2021  Last A1C was improved. Obtain follow up A1C. Continue diabetic diet.        Relevant Orders   Hemoglobin A1c   Comp Met (CMET)    No orders of the defined types were placed in this encounter.   I, MNance Pear NP, personally preformed the services described in this documentation.  All medical record entries made by the scribe were at my direction and in my presence.  I have reviewed the chart and discharge instructions (if applicable) and agree that the record reflects my personal performance and is accurate and complete. 01/02/2022   I,Amber Collins,acting as a scribe for MNance Pear NP.,have documented all relevant documentation on the behalf of MNance Pear NP,as directed by  MNance Pear NP while in the presence of MNance Pear NP.      MNance Pear NP

## 2022-01-02 NOTE — Patient Instructions (Signed)
Bonnie Brady , Thank you for taking time to come for your Medicare Wellness Visit. I appreciate your ongoing commitment to your health goals. Please review the following plan we discussed and let me know if I can assist you in the future.   These are the goals we discussed:  Goals   None     This is a list of the screening recommended for you and due dates:  Health Maintenance  Topic Date Due   COVID-19 Vaccine (1) Never done   Hepatitis C Screening: USPSTF Recommendation to screen - Ages 76-79 yo.  Never done   Zoster (Shingles) Vaccine (1 of 2) Never done   Flu Shot  10/28/2021   Mammogram  02/19/2022   Colon Cancer Screening  10/09/2024   Pneumonia Vaccine  Completed   DEXA scan (bone density measurement)  Completed   HPV Vaccine  Aged Out     Next appointment: Follow up in one year for your annual wellness visit    Preventive Care 68 Years and Older, Female Preventive care refers to lifestyle choices and visits with your health care provider that can promote health and wellness. What does preventive care include? A yearly physical exam. This is also called an annual well check. Dental exams once or twice a year. Routine eye exams. Ask your health care provider how often you should have your eyes checked. Personal lifestyle choices, including: Daily care of your teeth and gums. Regular physical activity. Eating a healthy diet. Avoiding tobacco and drug use. Limiting alcohol use. Practicing safe sex. Taking low-dose aspirin every day. Taking vitamin and mineral supplements as recommended by your health care provider. What happens during an annual well check? The services and screenings done by your health care provider during your annual well check will depend on your age, overall health, lifestyle risk factors, and family history of disease. Counseling  Your health care provider may ask you questions about your: Alcohol use. Tobacco use. Drug use. Emotional  well-being. Home and relationship well-being. Sexual activity. Eating habits. History of falls. Memory and ability to understand (cognition). Work and work Statistician. Reproductive health. Screening  You may have the following tests or measurements: Height, weight, and BMI. Blood pressure. Lipid and cholesterol levels. These may be checked every 5 years, or more frequently if you are over 79 years old. Skin check. Lung cancer screening. You may have this screening every year starting at age 70 if you have a 30-pack-year history of smoking and currently smoke or have quit within the past 15 years. Fecal occult blood test (FOBT) of the stool. You may have this test every year starting at age 34. Flexible sigmoidoscopy or colonoscopy. You may have a sigmoidoscopy every 5 years or a colonoscopy every 10 years starting at age 54. Hepatitis C blood test. Hepatitis B blood test. Sexually transmitted disease (STD) testing. Diabetes screening. This is done by checking your blood sugar (glucose) after you have not eaten for a while (fasting). You may have this done every 1-3 years. Bone density scan. This is done to screen for osteoporosis. You may have this done starting at age 33. Mammogram. This may be done every 1-2 years. Talk to your health care provider about how often you should have regular mammograms. Talk with your health care provider about your test results, treatment options, and if necessary, the need for more tests. Vaccines  Your health care provider may recommend certain vaccines, such as: Influenza vaccine. This is recommended every year. Tetanus,  diphtheria, and acellular pertussis (Tdap, Td) vaccine. You may need a Td booster every 10 years. Zoster vaccine. You may need this after age 55. Pneumococcal 13-valent conjugate (PCV13) vaccine. One dose is recommended after age 50. Pneumococcal polysaccharide (PPSV23) vaccine. One dose is recommended after age 76. Talk to your  health care provider about which screenings and vaccines you need and how often you need them. This information is not intended to replace advice given to you by your health care provider. Make sure you discuss any questions you have with your health care provider. Document Released: 04/12/2015 Document Revised: 12/04/2015 Document Reviewed: 01/15/2015 Elsevier Interactive Patient Education  2017 [place] Prevention in the Home Falls can cause injuries. They can happen to people of all ages. There are many things you can do to make your home safe and to help prevent falls. What can I do on the outside of my home? Regularly fix the edges of walkways and driveways and fix any cracks. Remove anything that might make you trip as you walk through a door, such as a raised step or threshold. Trim any bushes or trees on the path to your home. Use bright outdoor lighting. Clear any walking paths of anything that might make someone trip, such as rocks or tools. Regularly check to see if handrails are loose or broken. Make sure that both sides of any steps have handrails. Any raised decks and porches should have guardrails on the edges. Have any leaves, snow, or ice cleared regularly. Use sand or salt on walking paths during winter. Clean up any spills in your garage right away. This includes oil or grease spills. What can I do in the bathroom? Use night lights. Install grab bars by the toilet and in the tub and shower. Do not use towel bars as grab bars. Use non-skid mats or decals in the tub or shower. If you need to sit down in the shower, use a plastic, non-slip stool. Keep the floor dry. Clean up any water that spills on the floor as soon as it happens. Remove soap buildup in the tub or shower regularly. Attach bath mats securely with double-sided non-slip rug tape. Do not have throw rugs and other things on the floor that can make you trip. What can I do in the bedroom? Use night  lights. Make sure that you have a light by your bed that is easy to reach. Do not use any sheets or blankets that are too big for your bed. They should not hang down onto the floor. Have a firm chair that has side arms. You can use this for support while you get dressed. Do not have throw rugs and other things on the floor that can make you trip. What can I do in the kitchen? Clean up any spills right away. Avoid walking on wet floors. Keep items that you use a lot in easy-to-reach places. If you need to reach something above you, use a strong step stool that has a grab bar. Keep electrical cords out of the way. Do not use floor polish or wax that makes floors slippery. If you must use wax, use non-skid floor wax. Do not have throw rugs and other things on the floor that can make you trip. What can I do with my stairs? Do not leave any items on the stairs. Make sure that there are handrails on both sides of the stairs and use them. Fix handrails that are broken or loose. Make  sure that handrails are as long as the stairways. Check any carpeting to make sure that it is firmly attached to the stairs. Fix any carpet that is loose or worn. Avoid having throw rugs at the top or bottom of the stairs. If you do have throw rugs, attach them to the floor with carpet tape. Make sure that you have a light switch at the top of the stairs and the bottom of the stairs. If you do not have them, ask someone to add them for you. What else can I do to help prevent falls? Wear shoes that: Do not have high heels. Have rubber bottoms. Are comfortable and fit you well. Are closed at the toe. Do not wear sandals. If you use a stepladder: Make sure that it is fully opened. Do not climb a closed stepladder. Make sure that both sides of the stepladder are locked into place. Ask someone to hold it for you, if possible. Clearly mark and make sure that you can see: Any grab bars or handrails. First and last  steps. Where the edge of each step is. Use tools that help you move around (mobility aids) if they are needed. These include: Canes. Walkers. Scooters. Crutches. Turn on the lights when you go into a dark area. Replace any light bulbs as soon as they burn out. Set up your furniture so you have a clear path. Avoid moving your furniture around. If any of your floors are uneven, fix them. If there are any pets around you, be aware of where they are. Review your medicines with your doctor. Some medicines can make you feel dizzy. This can increase your chance of falling. Ask your doctor what other things that you can do to help prevent falls. This information is not intended to replace advice given to you by your health care provider. Make sure you discuss any questions you have with your health care provider. Document Released: 01/10/2009 Document Revised: 08/22/2015 Document Reviewed: 04/20/2014 Elsevier Interactive Patient Education  2017 [employer].

## 2022-02-03 ENCOUNTER — Ambulatory Visit (INDEPENDENT_AMBULATORY_CARE_PROVIDER_SITE_OTHER): Payer: Medicare Other | Admitting: Family Medicine

## 2022-02-03 ENCOUNTER — Encounter: Payer: Self-pay | Admitting: Family Medicine

## 2022-02-03 VITALS — BP 128/90 | HR 80 | Temp 98.1°F | Resp 18 | Ht 66.0 in | Wt 180.0 lb

## 2022-02-03 DIAGNOSIS — G4452 New daily persistent headache (NDPH): Secondary | ICD-10-CM | POA: Diagnosis not present

## 2022-02-03 LAB — CBC WITH DIFFERENTIAL/PLATELET
Basophils Absolute: 0 10*3/uL (ref 0.0–0.1)
Basophils Relative: 0.5 % (ref 0.0–3.0)
Eosinophils Absolute: 0 10*3/uL (ref 0.0–0.7)
Eosinophils Relative: 0.6 % (ref 0.0–5.0)
HCT: 45.3 % (ref 36.0–46.0)
Hemoglobin: 14.8 g/dL (ref 12.0–15.0)
Lymphocytes Relative: 27 % (ref 12.0–46.0)
Lymphs Abs: 1.9 10*3/uL (ref 0.7–4.0)
MCHC: 32.7 g/dL (ref 30.0–36.0)
MCV: 93.1 fl (ref 78.0–100.0)
Monocytes Absolute: 0.6 10*3/uL (ref 0.1–1.0)
Monocytes Relative: 8.3 % (ref 3.0–12.0)
Neutro Abs: 4.4 10*3/uL (ref 1.4–7.7)
Neutrophils Relative %: 63.6 % (ref 43.0–77.0)
Platelets: 225 10*3/uL (ref 150.0–400.0)
RBC: 4.87 Mil/uL (ref 3.87–5.11)
RDW: 14.1 % (ref 11.5–15.5)
WBC: 6.9 10*3/uL (ref 4.0–10.5)

## 2022-02-03 LAB — SEDIMENTATION RATE: Sed Rate: 4 mm/hr (ref 0–30)

## 2022-02-03 LAB — COMPREHENSIVE METABOLIC PANEL
ALT: 18 U/L (ref 0–35)
AST: 18 U/L (ref 0–37)
Albumin: 4.6 g/dL (ref 3.5–5.2)
Alkaline Phosphatase: 79 U/L (ref 39–117)
BUN: 19 mg/dL (ref 6–23)
CO2: 27 mEq/L (ref 19–32)
Calcium: 10 mg/dL (ref 8.4–10.5)
Chloride: 106 mEq/L (ref 96–112)
Creatinine, Ser: 1.01 mg/dL (ref 0.40–1.20)
GFR: 56.28 mL/min — ABNORMAL LOW (ref >60.00)
Glucose, Bld: 92 mg/dL (ref 70–99)
Potassium: 4.6 mEq/L (ref 3.5–5.1)
Sodium: 141 mEq/L (ref 135–145)
Total Bilirubin: 0.4 mg/dL (ref 0.2–1.2)
Total Protein: 6.8 g/dL (ref 6.0–8.3)

## 2022-02-03 NOTE — Patient Instructions (Signed)
General Headache Without Cause A headache is pain or discomfort felt around the head or neck area. There are many causes and types of headaches. A few common types include: Tension headaches. Migraine headaches. Cluster headaches. Chronic daily headaches. Sometimes, the specific cause of a headache may not be found. Follow these instructions at home: Watch your condition for any changes. Let your health care provider know about them. Take these steps to help with your condition: Managing pain     Take over-the-counter and prescription medicines only as told by your health care provider. Treatment may include medicines for pain that are taken by mouth or applied to the skin. Lie down in a dark, quiet room when you have a headache. Keep lights dim if bright lights bother you or make your headaches worse. If directed, put ice on your head and neck area: Put ice in a plastic bag. Place a towel between your skin and the bag. Leave the ice on for 20 minutes, 2-3 times per day. Remove the ice if your skin turns bright red. This is very important. If you cannot feel pain, heat, or cold, you have a greater risk of damage to the area. If directed, apply heat to the affected area. Use the heat source that your health care provider recommends, such as a moist heat pack or a heating pad. Place a towel between your skin and the heat source. Leave the heat on for 20-30 minutes. Remove the heat if your skin turns bright red. This is especially important if you are unable to feel pain, heat, or cold. You have a greater risk of getting burned. Eating and drinking Eat meals on a regular schedule. If you drink alcohol: Limit how much you have to: 0-1 drink a day for women who are not pregnant. 0-2 drinks a day for men. Know how much alcohol is in a drink. In the [place] one drink equals one 12 oz bottle of beer (355 mL), one 5 oz glass of wine (148 mL), or one 1 oz glass of hard liquor (44 mL). Stop  drinking caffeine, or decrease the amount of caffeine you drink. Drink enough fluid to keep your urine pale yellow. General instructions  Keep a headache journal to help find out what may trigger your headaches. For example, write down: What you eat and drink. How much sleep you get. Any change to your diet or medicines. Try massage or other relaxation techniques. Limit stress. Sit up straight, and do not tense your muscles. Do not use any products that contain nicotine or tobacco. These products include cigarettes, chewing tobacco, and vaping devices, such as e-cigarettes. If you need help quitting, ask your health care provider. Exercise regularly as told by your health care provider. Sleep on a regular schedule. Get 7-9 hours of sleep each night, or the amount recommended by your health care provider. Keep all follow-up visits. This is important. Contact a health care provider if: Medicine does not help your symptoms. You have a headache that is different from your usual headache. You have nausea or you vomit. You have a fever. Get help right away if: Your headache: Becomes severe quickly. Gets worse after moderate to intense physical activity. You have any of these symptoms: Repeated vomiting. Pain or stiffness in your neck. Changes to your vision. Pain in an eye or ear. Problems with speech. Muscular weakness or loss of muscle control. Loss of balance or coordination. You feel faint or pass out. You have confusion. You have   a seizure. These symptoms may represent a serious problem that is an emergency. Do not wait to see if the symptoms will go away. Get medical help right away. Call your local emergency services (911 in the [place] Do not drive yourself to the hospital. Summary A headache is pain or discomfort felt around the head or neck area. There are many causes and types of headaches. In some cases, the cause may not be found. Keep a headache journal to help find out  what may trigger your headaches. Watch your condition for any changes. Let your health care provider know about them. Contact a health care provider if you have a headache that is different from the usual headache, or if your symptoms are not helped by medicine. Get help right away if your headache becomes severe, you vomit, you have a loss of vision, you lose your balance, or you have a seizure. This information is not intended to replace advice given to you by your health care provider. Make sure you discuss any questions you have with your health care provider. Document Revised: 08/14/2020 Document Reviewed: 08/14/2020 Elsevier Patient Education  2023 Elsevier Inc.  

## 2022-02-03 NOTE — Progress Notes (Signed)
Subjective:   By signing my name below, I, Bonnie Brady, attest that this documentation has been prepared under the direction and in the presence of [name], DO. 02/03/2022    Patient ID: Bonnie Brady, female    DOB: 16-Jan-1951, 71 y.o.   MRN: [id number]  Chief Complaint  Patient presents with  . Head Pain    Pt states having pain on the right side of her head. Pt states having pain and pressure on the right side. Pt states no falls or injuries. No headache or blurred vision.     HPI Patient is in today for a office visit.   She complains of constant pain on her right scalp since 01/29/2022. The next day her hair stylist found her scalp was red. The day after her pain worsened. Her pain has not moved since it started. Her pain started as intermittent then worsened to constant pain. She feels the pain is right under her scalp. She had nausea yesterday. She denies changes in speech or facial drooping, vision changes, vomiting, neck pain, prior injuries to her scalp.  She is taking aleve and found no relief in her symptoms.  Her blood pressure is slightly elevated during this visit . BP Readings from Last 3 Encounters:  02/03/22 (!) 128/90  01/02/22 122/72  01/02/22 125/75   Pulse Readings from Last 3 Encounters:  02/03/22 80  01/02/22 75  01/02/22 85     Past Medical History:  Diagnosis Date  . Borderline diabetes   . COVID-19 10/2018  . Hyperlipidemia   . Kidney stone   . Osteopenia   . Tachycardia    normal cardiology workup, no palpitations in 5-6 yrs  . Varicose veins     Past Surgical History:  Procedure Laterality Date  . ABDOMINAL HYSTERECTOMY    . APPENDECTOMY  1971  . bladder strap  2006  . [place]  . laser of veins  2010   also in 2016 and 2017  . MASS EXCISION Right 03/13/2020   Procedure: EXCISION OF RIGHT HIP MASS;  Surgeon: Rolm Bookbinder, MD;  Location: [facility];  Service: General;   Laterality: Right;  . TONSILLECTOMY  1958    Family History  Problem Relation Age of Onset  . Breast cancer Paternal Grandmother   . Stroke Mother   . Stroke Maternal Grandfather   . COPD Father   . COPD Brother   . Breast cancer Daughter     Social History   Socioeconomic History  . Marital status: Married    Spouse name: Not on file  . Number of children: Not on file  . Years of education: Not on file  . Highest education level: Not on file  Occupational History  . Not on file  Tobacco Use  . Smoking status: Former    Types: Cigarettes    Quit date: 03/30/2008    Years since quitting: 13.8  . Smokeless tobacco: Never  Vaping Use  . Vaping Use: Not on file  Substance and Sexual Activity  . Alcohol use: Yes    Comment: occasional   . Drug use: No  . Sexual activity: Not on file  Other Topics Concern  . Not on file  Social History Narrative   Has worked as Engineering geologist- stopped working 2011.    Married   2 children   Enjoys Airline pilot      Social Determinants of  Health   Financial Resource Strain: Low Risk  (01/02/2022)   Overall Financial Resource Strain (CARDIA)   . Difficulty of Paying Living Expenses: Not hard at all  Food Insecurity: No Food Insecurity (01/02/2022)   Hunger Vital Sign   . Worried About Charity fundraiser in the Last Year: Never true   . Ran Out of Food in the Last Year: Never true  Transportation Needs: No Transportation Needs (01/02/2022)   PRAPARE - Transportation   . Lack of Transportation (Medical): No   . Lack of Transportation (Non-Medical): No  Physical Activity: Insufficiently Active (01/02/2022)   Exercise Vital Sign   . Days of Exercise per Week: 7 days   . Minutes of Exercise per Session: 20 min  Stress: No Stress Concern Present (01/02/2022)   [place]   . Feeling of Stress : Not at all  Social Connections: Socially  Integrated (01/02/2022)   Social Connection and Isolation Panel [NHANES]   . Frequency of Communication with Friends and Family: More than three times a week   . Frequency of Social Gatherings with Friends and Family: Three times a week   . Attends Religious Services: More than 4 times per year   . Active Member of Clubs or Organizations: Yes   . Attends Archivist Meetings: More than 4 times per year   . Marital Status: Married  Human resources officer Violence: Not At Risk (01/02/2022)   Humiliation, Afraid, Rape, and Kick questionnaire   . Fear of Current or Ex-Partner: No   . Emotionally Abused: No   . Physically Abused: No   . Sexually Abused: No    Outpatient Medications Prior to Visit  Medication Sig Dispense Refill  . Estradiol-Estriol-Progesterone (BIEST/PROGESTERONE) CREA Place 1 application onto the skin daily.    . Nutritional Supplements (JUICE PLUS FIBRE PO) Take 4 tablets by mouth in the morning and at bedtime.    . Omega-3 Fatty Acids (OMEGA 3 PO) Take 1 capsule by mouth in the morning and at bedtime.    . rosuvastatin (CRESTOR) 20 MG tablet Take 1 tablet (20 mg total) by mouth daily. 90 tablet 3   No facility-administered medications prior to visit.    Allergies  Allergen Reactions  . Latex Rash    Contact with powder.  . Tetanus Toxoids Other (See Comments)    Swelling / redness  . Nickel Rash    Blisters    Review of Systems  Constitutional:  Negative for chills, fever and malaise/fatigue.  HENT:  Negative for congestion and hearing loss.   Eyes:  Negative for discharge.       (-)vision changes  Respiratory:  Negative for cough, sputum production and shortness of breath.   Cardiovascular:  Negative for chest pain, palpitations and leg swelling.  Gastrointestinal:  Negative for abdominal pain, blood in stool, constipation, diarrhea, heartburn, nausea and vomiting.  Genitourinary:  Negative for dysuria, frequency, hematuria and urgency.  Musculoskeletal:   Negative for back pain, falls, myalgias and neck pain.  Skin:  Negative for rash.       (+)right side scalp tenderness and errythematous   Neurological:  Positive for tingling. Negative for dizziness, tremors, sensory change, speech change, loss of consciousness, weakness and headaches.       (-)facial drooping Cn 2-12 intact  Pain R parietal area -- no rash just errythema   Endo/Heme/Allergies:  Negative for environmental allergies. Does not bruise/bleed easily.  Psychiatric/Behavioral:  Negative for  depression and suicidal ideas. The patient is not nervous/anxious and does not have insomnia.        Objective:    Physical Exam Vitals and nursing note reviewed.  Constitutional:      General: She is not in acute distress.    Appearance: Normal appearance. She is well-developed. She is not ill-appearing.  HENT:     Head: Normocephalic and atraumatic.     Right Ear: External ear normal.     Left Ear: External ear normal.  Eyes:     Extraocular Movements: Extraocular movements intact.     Conjunctiva/sclera: Conjunctivae normal.     Pupils: Pupils are equal, round, and reactive to light.  Neck:     Thyroid: No thyromegaly.     Vascular: No carotid bruit or JVD.  Cardiovascular:     Rate and Rhythm: Normal rate and regular rhythm.     Heart sounds: Normal heart sounds. No murmur heard.    No gallop.  Pulmonary:     Effort: Pulmonary effort is normal. No respiratory distress.     Breath sounds: Normal breath sounds. No wheezing or rales.  Chest:     Chest wall: No tenderness.  Musculoskeletal:     Cervical back: Normal range of motion and neck supple.     Comments: 5/5 strength in both upper and lower extremities  Skin:    General: Skin is warm and dry.     Comments: Redness on right side scalp.  Neurological:     Mental Status: She is alert and oriented to person, place, and time.  Psychiatric:        Judgment: Judgment normal.    BP (!) 128/90 (BP Location: Left Arm,  Patient Position: Sitting, Cuff Size: Normal)   Pulse 80   Temp 98.1 F (36.7 C) (Oral)   Resp 18   Ht '5\' 6"'$  (1.676 m)   Wt 180 lb (81.6 kg)   SpO2 95%   BMI 29.05 kg/m  Wt Readings from Last 3 Encounters:  02/03/22 180 lb (81.6 kg)  01/02/22 180 lb 8 oz (81.9 kg)  01/02/22 180 lb 3.2 oz (81.7 kg)    Diabetic Foot Exam - Simple   No data filed    Lab Results  Component Value Date   WBC 8.9 05/29/2021   HGB 13.8 05/29/2021   HCT 41.1 05/29/2021   PLT 223 05/29/2021   GLUCOSE 99 01/02/2022   CHOL 142 12/08/2021   TRIG 229 (H) 12/08/2021   HDL 39 (L) 12/08/2021   LDLDIRECT 93.0 02/07/2019   LDLCALC 66 12/08/2021   ALT 19 01/02/2022   AST 22 01/02/2022   NA 142 01/02/2022   K 4.5 01/02/2022   CL 108 01/02/2022   CREATININE 1.04 01/02/2022   BUN 23 01/02/2022   CO2 26 01/02/2022   TSH 2.27 02/07/2019   HGBA1C 6.2 01/02/2022    Lab Results  Component Value Date   TSH 2.27 02/07/2019   Lab Results  Component Value Date   WBC 8.9 05/29/2021   HGB 13.8 05/29/2021   HCT 41.1 05/29/2021   MCV 96.5 05/29/2021   PLT 223 05/29/2021   Lab Results  Component Value Date   NA 142 01/02/2022   K 4.5 01/02/2022   CO2 26 01/02/2022   GLUCOSE 99 01/02/2022   BUN 23 01/02/2022   CREATININE 1.04 01/02/2022   BILITOT 0.5 01/02/2022   ALKPHOS 76 01/02/2022   AST 22 01/02/2022   ALT 19 01/02/2022  PROT 6.5 01/02/2022   ALBUMIN 4.3 01/02/2022   CALCIUM 9.5 01/02/2022   ANIONGAP 11 05/29/2021   GFR 54.37 (L) 01/02/2022   Lab Results  Component Value Date   CHOL 142 12/08/2021   Lab Results  Component Value Date   HDL 39 (L) 12/08/2021   Lab Results  Component Value Date   LDLCALC 66 12/08/2021   Lab Results  Component Value Date   TRIG 229 (H) 12/08/2021   Lab Results  Component Value Date   CHOLHDL 3.6 12/08/2021   Lab Results  Component Value Date   HGBA1C 6.2 01/02/2022       Assessment & Plan:   Problem List Items Addressed This Visit    None    No orders of the defined types were placed in this encounter.   I, Bonnie Reeves Dam, personally preformed the services described in this documentation.  All medical record entries made by the scribe were at my direction and in my presence.  I have reviewed the chart and discharge instructions (if applicable) and agree that the record reflects my personal performance and is accurate and complete. 02/03/2022   I,Bonnie Brady,acting as a scribe for [name], DO.,have documented all relevant documentation on the behalf of [name], DO,as directed by  [name], DO while in the presence of [name], DO.   Bonnie [employer]

## 2022-02-04 ENCOUNTER — Ambulatory Visit (HOSPITAL_BASED_OUTPATIENT_CLINIC_OR_DEPARTMENT_OTHER)
Admission: RE | Admit: 2022-02-04 | Discharge: 2022-02-04 | Disposition: A | Discharge status: Home / Self Care | Payer: Medicare Other | Source: Ambulatory Visit | Attending: Family Medicine | Admitting: Family Medicine

## 2022-02-04 ENCOUNTER — Other Ambulatory Visit: Payer: Self-pay | Admitting: Family Medicine

## 2022-02-04 ENCOUNTER — Encounter (HOSPITAL_BASED_OUTPATIENT_CLINIC_OR_DEPARTMENT_OTHER): Payer: Self-pay

## 2022-02-04 ENCOUNTER — Ambulatory Visit (HOSPITAL_BASED_OUTPATIENT_CLINIC_OR_DEPARTMENT_OTHER): Admission: RE | Admit: 2022-02-04 | Payer: Medicare Other | Source: Ambulatory Visit

## 2022-02-04 DIAGNOSIS — G4452 New daily persistent headache (NDPH): Secondary | ICD-10-CM | POA: Diagnosis not present

## 2022-02-04 DIAGNOSIS — I6381 Other cerebral infarction due to occlusion or stenosis of small artery: Secondary | ICD-10-CM | POA: Diagnosis not present

## 2022-02-04 DIAGNOSIS — R519 Headache, unspecified: Secondary | ICD-10-CM | POA: Diagnosis not present

## 2022-02-05 ENCOUNTER — Other Ambulatory Visit: Payer: Self-pay | Admitting: Family Medicine

## 2022-02-05 ENCOUNTER — Encounter: Payer: Self-pay | Admitting: Cardiovascular Disease

## 2022-02-05 DIAGNOSIS — G459 Transient cerebral ischemic attack, unspecified: Secondary | ICD-10-CM

## 2022-02-05 NOTE — Telephone Encounter (Signed)
Left detailed voicemail on  patient's cell phone. Per Dr. Kennon Holter neurology referral: Dr. Bunnie Pion at [facility]. Phone [phone].

## 2022-02-05 NOTE — Telephone Encounter (Signed)
New Message:     Patient said she had a CT yesterday and was told she need to see a Neurologist. She would like for Dr Gwenlyn Found to refer her to a Neurologist please.

## 2022-02-05 NOTE — Telephone Encounter (Signed)
Spoke with patient who asked for Dr. Gwenlyn Found to recommend a neurologist for her.  Per CT head /Dr. Kendrick Fries chase, DO: "Looks like pt had a mini stroke in the past --- would recommend taking aspirin 325 daily and refer to neuro."  Please advise of neuro referral.

## 2022-02-10 ENCOUNTER — Ambulatory Visit (INDEPENDENT_AMBULATORY_CARE_PROVIDER_SITE_OTHER): Payer: Medicare Other | Admitting: Neurology

## 2022-02-10 ENCOUNTER — Encounter: Payer: Self-pay | Admitting: Neurology

## 2022-02-10 ENCOUNTER — Telehealth: Payer: Self-pay | Admitting: Neurology

## 2022-02-10 VITALS — BP 139/77 | HR 73 | Ht 66.0 in | Wt 179.0 lb

## 2022-02-10 DIAGNOSIS — I63412 Cerebral infarction due to embolism of left middle cerebral artery: Secondary | ICD-10-CM

## 2022-02-10 DIAGNOSIS — G4452 New daily persistent headache (NDPH): Secondary | ICD-10-CM

## 2022-02-10 DIAGNOSIS — R9402 Abnormal brain scan: Secondary | ICD-10-CM

## 2022-02-10 NOTE — Patient Instructions (Signed)
I had a long discussion with the patient with to her new onset daily persistent headache which fortunately appears to have resolved and discussed results of available labs brain imaging and answered questions.  She denies any history suggestive of stroke or TIA abnormal CT scan brain further evaluation neck.  Reduce the dose to 81 mg daily and aggressive risk factor modification blood pressure goal below 140/90, lipids with LDL cholesterol goal below 70 mg diabetes with hemoglobin A1c goal below 6.5  She was advised to eat a healthy diet with lots of fruits, vegetables, whole grains and be active and exercise regularly and lose weight.  She will return for follow-up in the future in 3 months or call earlier if necessary.  Stroke Prevention Some medical conditions and behaviors can lead to a higher chance of having a stroke. You can help prevent a stroke by eating healthy, exercising, not smoking, and managing any medical conditions you have. Stroke is a leading cause of functional impairment. Primary prevention is particularly important because a majority of strokes are first-time events. Stroke changes the lives of not only those who experience a stroke but also their family and other caregivers. How can this condition affect me? A stroke is a medical emergency and should be treated right away. A stroke can lead to brain damage and can sometimes be life-threatening. If a person gets medical treatment right away, there is a better chance of surviving and recovering from a stroke. What can increase my risk? The following medical conditions may increase your risk of a stroke: Cardiovascular disease. High blood pressure (hypertension). Diabetes. High cholesterol. Sickle cell disease. Blood clotting disorders (hypercoagulable state). Obesity. Sleep disorders (obstructive sleep apnea). Other risk factors include: Being older than age 63. Having a history of blood clots, stroke, or mini-stroke  (transient ischemic attack, TIA). Genetic factors, such as race, ethnicity, or a family history of stroke. Smoking cigarettes or using other tobacco products. Taking birth control pills, especially if you also use tobacco. Heavy use of alcohol or drugs, especially cocaine and methamphetamine. Physical inactivity. What actions can I take to prevent this? Manage your health conditions High cholesterol levels. Eating a healthy diet is important for preventing high cholesterol. If cholesterol cannot be managed through diet alone, you may need to take medicines. Take any prescribed medicines to control your cholesterol as told by your health care provider. Hypertension. To reduce your risk of stroke, try to keep your blood pressure below 130/80. Eating a healthy diet and exercising regularly are important for controlling blood pressure. If these steps are not enough to manage your blood pressure, you may need to take medicines. Take any prescribed medicines to control hypertension as told by your health care provider. Ask your health care provider if you should monitor your blood pressure at home. Have your blood pressure checked every year, even if your blood pressure is normal. Blood pressure increases with age and some medical conditions. Diabetes. Eating a healthy diet and exercising regularly are important parts of managing your blood sugar (glucose). If your blood sugar cannot be managed through diet and exercise, you may need to take medicines. Take any prescribed medicines to control your diabetes as told by your health care provider. Get evaluated for obstructive sleep apnea. Talk to your health care provider about getting a sleep evaluation if you snore a lot or have excessive sleepiness. Make sure that any other medical conditions you have, such as atrial fibrillation or atherosclerosis, are managed. Nutrition Follow instructions  from your health care provider about what to eat or drink  to help manage your health condition. These instructions may include: Reducing your daily calorie intake. Limiting how much salt (sodium) you use to 1,500 milligrams (mg) each day. Using only healthy fats for cooking, such as olive oil, canola oil, or sunflower oil. Eating healthy foods. You can do this by: Choosing foods that are high in fiber, such as whole grains, and fresh fruits and vegetables. Eating at least 5 servings of fruits and vegetables a day. Try to fill one-half of your plate with fruits and vegetables at each meal. Choosing lean protein foods, such as lean cuts of meat, poultry without skin, fish, tofu, beans, and nuts. Eating low-fat dairy products. Avoiding foods that are high in sodium. This can help lower blood pressure. Avoiding foods that have saturated fat, trans fat, and cholesterol. This can help prevent high cholesterol. Avoiding processed and prepared foods. Counting your daily carbohydrate intake.  Lifestyle If you drink alcohol: Limit how much you have to: 0-1 drink a day for women who are not pregnant. 0-2 drinks a day for men. Know how much alcohol is in your drink. In the [place] one drink equals one 12 oz bottle of beer (375m), one 5 oz glass of wine (1467m, or one 1 oz glass of hard liquor (4423m Do not use any products that contain nicotine or tobacco. These products include cigarettes, chewing tobacco, and vaping devices, such as e-cigarettes. If you need help quitting, ask your health care provider. Avoid secondhand smoke. Do not use drugs. Activity  Try to stay at a healthy weight. Get at least 30 minutes of exercise on most days, such as: Fast walking. Biking. Swimming. Medicines Take over-the-counter and prescription medicines only as told by your health care provider. Aspirin or blood thinners (antiplatelets or anticoagulants) may be recommended to reduce your risk of forming blood clots that can lead to stroke. Avoid taking birth control  pills. Talk to your health care provider about the risks of taking birth control pills if: You are over 35 65ars old. You smoke. You get very bad headaches. You have had a blood clot. Where to find more information American Stroke Association: www.strokeassociation.org Get help right away if: You or a loved one has any symptoms of a stroke. "BE FAST" is an easy way to remember the main warning signs of a stroke: B - Balance. Signs are dizziness, sudden trouble walking, or loss of balance. E - Eyes. Signs are trouble seeing or a sudden change in vision. F - Face. Signs are sudden weakness or numbness of the face, or the face or eyelid drooping on one side. A - Arms. Signs are weakness or numbness in an arm. This happens suddenly and usually on one side of the body. S - Speech. Signs are sudden trouble speaking, slurred speech, or trouble understanding what people say. T - Time. Time to call emergency services. Write down what time symptoms started. You or a loved one has other signs of a stroke, such as: A sudden, severe headache with no known cause. Nausea or vomiting. Seizure. These symptoms may represent a serious problem that is an emergency. Do not wait to see if the symptoms will go away. Get medical help right away. Call your local emergency services (911 in the [place] Do not drive yourself to the hospital. Summary You can help to prevent a stroke by eating healthy, exercising, not smoking, limiting alcohol intake, and managing any medical conditions  you may have. Do not use any products that contain nicotine or tobacco. These include cigarettes, chewing tobacco, and vaping devices, such as e-cigarettes. If you need help quitting, ask your health care provider. Remember "BE FAST" for warning signs of a stroke. Get help right away if you or a loved one has any of these signs. This information is not intended to replace advice given to you by your health care provider. Make sure you  discuss any questions you have with your health care provider. Document Revised: 10/16/2019 Document Reviewed: 10/16/2019 Elsevier Patient Education  [place].

## 2022-02-10 NOTE — Progress Notes (Signed)
[facility] [address] [place]. [place] [address] [phone]       OFFICE CONSULT NOTE  [name] Date of Birth:  1950-06-14 Medical Record Number:  [id number]   Referring AT:[facility]  Reason for Referral: Abnormal CT scan and headache  HPI: [name] is a pleasant 71 year old Caucasian lady seen today for initial office consultation visit for abnormal CT scan of the head.  She is accompanied by her husband.  History is obtained  them and review of electronic medical records, referral notes and I personally reviewed pertinent available imaging films in PACS.  She has past medical history of hyperlipidemia, borderline diabetes, osteopenia and tachycardia.  She states that she woke up on 01/30/2022 with a feeling of irritation in the scalp on the top of the head.  He describes this as a constant pressure and discomfort.  He went for a hair dressing appointment and noticed that there was some redness at that site.  She took Aleve 200 mg 1 tablet this did not help.  This lasted for 10 straight days and was constant mild in intensity but not disabling.  She denied any loss of vision, blurred vision, scalp tenderness, jaw claudication.  She has no prior history of migraines headaches symptomatic.  Surprisingly this morning she noticed that the headache is gone and has not come back.  She saw her primary care physician who did lab work on 02/03/2022 and ESR was normal CMP and CBC were unremarkable.  CT scan of the head was done on 02/04/2022 which I personally reviewed shows no acute abnormality.  There is area of low density in the left internal capsule suggestive of remote age lacunar infarct.  There are also changes of age-related small vessel disease.  Patient denies any prior history of strokes or TIAs or focal extremity weakness, slurred speech right-sided weakness to go with subcortical infarct noted on CT scan.  She does however have family history of strokes in  her mother as well as maternal grandfather.  He has prior history of chest pain and tachycardia and sees Dr. Quay Burow.  Aspirin daily as well as Crestor.  Lab work on 01/02/2022 showed hemoglobin A1c to be borderline at 6.2 LDL cholesterol was 66 mg percent on 12/08/21.  She has no complaints today.     ROS:   14 system review of systems is positive for headache, scalp irritation, pressure and all other systems negative  PMH:  Past Medical History:  Diagnosis Date   Borderline diabetes    COVID-19 10/2018   Hyperlipidemia    Kidney stone    Osteopenia    Tachycardia    normal cardiology workup, no palpitations in 5-6 yrs   Varicose veins     Social History:  Social History   Socioeconomic History   Marital status: Married    Spouse name: Not on file   Number of children: Not on file   Years of education: Not on file   Highest education level: Not on file  Occupational History   Not on file  Tobacco Use   Smoking status: Former    Types: Cigarettes    Quit date: 03/30/2008    Years since quitting: 13.8   Smokeless tobacco: Never  Vaping Use   Vaping Use: Not on file  Substance and Sexual Activity   Alcohol use: Yes    Comment: occasional    Drug use: No   Sexual activity: Not on file  Other Topics  Concern   Not on file  Social History Narrative   Has worked as Engineering geologist- stopped working 2011.    Married   2 children   Enjoys Airline pilot      Social Determinants of Health   Financial Resource Strain: Low Risk  (01/02/2022)   Overall Financial Resource Strain (CARDIA)    Difficulty of Paying Living Expenses: Not hard at all  Food Insecurity: No Food Insecurity (01/02/2022)   Hunger Vital Sign    Worried About Running Out of Food in the Last Year: Never true    Ran Out of Food in the Last Year: Never true  Transportation Needs: No Transportation Needs (01/02/2022)   PRAPARE - Hydrologist  (Medical): No    Lack of Transportation (Non-Medical): No  Physical Activity: Insufficiently Active (01/02/2022)   Exercise Vital Sign    Days of Exercise per Week: 7 days    Minutes of Exercise per Session: 20 min  Stress: No Stress Concern Present (01/02/2022)   [place]    Feeling of Stress : Not at all  Social Connections: [place] (01/02/2022)   Social Connection and Isolation Panel [NHANES]    Frequency of Communication with Friends and Family: More than three times a week    Frequency of Social Gatherings with Friends and Family: Three times a week    Attends Religious Services: More than 4 times per year    Active Member of Clubs or Organizations: Yes    Attends Archivist Meetings: More than 4 times per year    Marital Status: Married  Human resources officer Violence: Not At Risk (01/02/2022)   Humiliation, Afraid, Rape, and Kick questionnaire    Fear of Current or Ex-Partner: No    Emotionally Abused: No    Physically Abused: No    Sexually Abused: No    Medications:   Current Outpatient Medications on File Prior to Visit  Medication Sig Dispense Refill   aspirin EC 325 MG tablet Take 325 mg by mouth daily.     Estradiol-Estriol-Progesterone (BIEST/PROGESTERONE) CREA Place 1 application onto the skin daily.     Nutritional Supplements (JUICE PLUS FIBRE PO) Take 4 tablets by mouth in the morning and at bedtime.     Omega-3 Fatty Acids (OMEGA 3 PO) Take 1 capsule by mouth in the morning and at bedtime.     rosuvastatin (CRESTOR) 20 MG tablet Take 1 tablet (20 mg total) by mouth daily. 90 tablet 3   No current facility-administered medications on file prior to visit.    Allergies:   Allergies  Allergen Reactions   Latex Rash    Contact with powder.   Tetanus Toxoids Other (See Comments)    Swelling / redness   Nickel Rash    Blisters    Physical Exam General: well developed, well  nourished pleasant middle-age Caucasian lady, seated, in no evident distress Head: head normocephalic and atraumatic.  No tenderness of superficial temporal arteries bilaterally. Neck: supple with no carotid or supraclavicular bruits Cardiovascular: regular rate and rhythm, no murmurs Musculoskeletal: no deformity Skin:  no rash/petichiae Vascular:  Normal pulses all extremities  Neurologic Exam Mental Status: Awake and fully alert. Oriented to place and time. Recent and remote memory intact. Attention span, concentration and fund of knowledge appropriate. Mood and affect appropriate.  Cranial Nerves: Fundoscopic exam reveals sharp disc margins. Pupils equal, briskly reactive to light.  Extraocular movements full without nystagmus. Visual fields full to confrontation. Hearing intact. Facial sensation intact. Face, tongue, palate moves normally and symmetrically.  Motor: Normal bulk and tone. Normal strength in all tested extremity muscles. Sensory.: intact to touch , pinprick , position and vibratory sensation.  Coordination: Rapid alternating movements normal in all extremities. Finger-to-nose and heel-to-shin performed accurately bilaterally. Gait and Station: Arises from chair without difficulty. Stance is normal. Gait demonstrates normal stride length and balance . Able to heel, toe and tandem walk with slight difficulty.  Reflexes: 1+ and symmetric. Toes downgoing.   NIHSS  0 Modified Rankin  0   ASSESSMENT: 71 year old Caucasian lady with new onset daily persistent headache of unclear etiology for the last 10 days with appears to have resolved.  Clinical symptomatology and lab work do not suggest temporal arteritis.  Abnormal brain imaging suggestive of left subcortical silent lacunar infarct with no clinical symptomatology to suggest prior TIA or stroke.  Vascular risk factors of mild hyperlipidemia and borderline diabetes only.     PLAN: I had a long discussion with the patient with  to her new onset daily persistent headache which fortunately appears to have resolved and discussed results of available labs brain imaging and answered questions.  She denies any history suggestive of stroke or TIA abnormal CT scan brain further evaluation neck.  Reduce the dose to 81 mg daily and aggressive risk factor modification blood pressure goal below 140/90, lipids with LDL cholesterol goal below 70 mg diabetes with hemoglobin A1c goal below 6.5  She was advised to eat a healthy diet with lots of fruits, vegetables, whole grains and be active and exercise regularly and lose weight.  She will return for follow-up in the future in 3 months or call earlier if necessary.  Greater than 50% time during this 45-minute consultation visit was spent on counseling and coordination of care about her new headache as well as abnormal CT scan and need for further evaluation and treatment and  Antony Contras, MD Note: This document was prepared with digital dictation and possible smart phrase technology. Any transcriptional errors that result from this process are unintentional.

## 2022-02-10 NOTE — Telephone Encounter (Signed)
medicare no auth required sent to [facility] [phone]

## 2022-02-17 ENCOUNTER — Telehealth: Payer: Self-pay | Admitting: Neurology

## 2022-02-17 NOTE — Telephone Encounter (Signed)
error 

## 2022-02-17 NOTE — Telephone Encounter (Signed)
Pt said head pain has come back. Today have gotten worse with pain level 7. Have taken 3 Advil tablets around 12pm, relief some of the pain. Would like a call from the nurse

## 2022-02-24 DIAGNOSIS — Z23 Encounter for immunization: Secondary | ICD-10-CM | POA: Diagnosis not present

## 2022-03-07 ENCOUNTER — Ambulatory Visit
Admission: RE | Admit: 2022-03-07 | Discharge: 2022-03-07 | Disposition: A | Discharge status: Home / Self Care | Payer: Medicare Other | Source: Ambulatory Visit | Attending: Neurology | Admitting: Neurology

## 2022-03-07 DIAGNOSIS — I63412 Cerebral infarction due to embolism of left middle cerebral artery: Secondary | ICD-10-CM | POA: Diagnosis not present

## 2022-03-07 DIAGNOSIS — G4452 New daily persistent headache (NDPH): Secondary | ICD-10-CM | POA: Diagnosis not present

## 2022-03-07 DIAGNOSIS — R9402 Abnormal brain scan: Secondary | ICD-10-CM | POA: Diagnosis not present

## 2022-03-07 MED ORDER — GADOPICLENOL 0.5 MMOL/ML IV SOLN
7.5000 mL | Freq: Once | INTRAVENOUS | Status: AC | PRN
  Administered 2022-03-07: 7.5 mL via INTRAVENOUS

## 2022-03-21 NOTE — Progress Notes (Signed)
Kindly inform the patient that MRI scan of the brain shows age-related changes of hardening of the arteries.  No definite stroke or any worrisome finding

## 2022-03-21 NOTE — Progress Notes (Signed)
Kindly inform the patient that MR angiogram study of the brain shows blockage of one of the blood vessels towards the top of the brain on the right and a smaller vessel towards the back on the left.  Continue medical therapy for now.

## 2022-03-21 NOTE — Progress Notes (Signed)
Kindly inform the patient that MR angiogram study of the neck shows no major blockages of either carotid arteries.

## 2022-04-23 ENCOUNTER — Ambulatory Visit (INDEPENDENT_AMBULATORY_CARE_PROVIDER_SITE_OTHER): Payer: Medicare Other | Admitting: Family Medicine

## 2022-04-23 ENCOUNTER — Ambulatory Visit (HOSPITAL_BASED_OUTPATIENT_CLINIC_OR_DEPARTMENT_OTHER)
Admission: RE | Admit: 2022-04-23 | Discharge: 2022-04-23 | Disposition: A | Discharge status: Home / Self Care | Payer: Medicare Other | Source: Ambulatory Visit | Attending: Family Medicine | Admitting: Family Medicine

## 2022-04-23 ENCOUNTER — Encounter: Payer: Self-pay | Admitting: Family Medicine

## 2022-04-23 VITALS — BP 128/83 | HR 76 | Temp 97.8°F | Resp 16 | Ht 66.5 in | Wt 178.0 lb

## 2022-04-23 DIAGNOSIS — S99912A Unspecified injury of left ankle, initial encounter: Secondary | ICD-10-CM

## 2022-04-23 DIAGNOSIS — J0140 Acute pansinusitis, unspecified: Secondary | ICD-10-CM | POA: Diagnosis not present

## 2022-04-23 DIAGNOSIS — M25572 Pain in left ankle and joints of left foot: Secondary | ICD-10-CM | POA: Diagnosis not present

## 2022-04-23 DIAGNOSIS — S82832A Other fracture of upper and lower end of left fibula, initial encounter for closed fracture: Secondary | ICD-10-CM | POA: Diagnosis not present

## 2022-04-23 MED ORDER — AMOXICILLIN-POT CLAVULANATE 875-125 MG PO TABS: 1.0000 | ORAL_TABLET | Freq: Two times a day (BID) | ORAL | 0 refills | Status: AC

## 2022-04-23 NOTE — Patient Instructions (Signed)
Sinusitis: - Given duration, adding Augmentin. - Continue supportive measures including rest, hydration, humidifier use, steam showers, warm compresses to sinuses, warm liquids with lemon and honey, and over-the-counter cough, cold, and analgesics as needed.   Ankle pain: - Xray given duration of pain - In negative, recommend lace up brace for 2-3 weeks, ankle range of motion exercises, ice/heat, rest, elevation

## 2022-04-23 NOTE — Progress Notes (Signed)
Acute Office Visit  Subjective:     Patient ID: Bonnie Brady, female    DOB: [birth year], 72 y.o.   MRN: [id number]  Chief Complaint  Patient presents with   Sinus Problem   Ankle Pain    left    Sinus Problem  Ankle Pain      Upper Respiratory Infection: Patient complains of symptoms of a URI. Symptoms include left ear pain, congestion, and bilateral frontal/maxillary sinus pressure radiating into teeth, headaches, rhinorrhea, fevers last week, fatigue, mild cough, postnasal drainage, sore throat. . Onset of symptoms was 9 days ago, gradually worsening since that time.  She is drinking plenty of fluids. Evaluation to date: none. Treatment to date:  Sudafed PE, Zyrtec , Netti Pot.    Ankle Pain: Patient complains of left ankle pain.  Onset of the symptoms was 10 days ago. Inciting event: injured while walking in snow/ice (fell) . Current symptoms include pain with eversion of the foot, swelling, and inability to bear weight for more than a few minutes .  Aggravating symptoms: any weight bearing. Patient's pain has gradually been worsening. Patient has had no prior ankle problems. Previous visits for this problem: none.  Evaluation to date: none.  Treatment to date: ice and Aleve, rest .    ROS All review of systems negative except what is listed in the HPI        Objective:    BP 128/83   Pulse 76   Temp 97.8 F (36.6 C)   Resp 16   Ht 5' 6.5" (1.689 m)   Wt 178 lb (80.7 kg)   SpO2 99%   BMI 28.30 kg/m    Physical Exam Vitals reviewed.  Constitutional:      Appearance: Normal appearance.  HENT:     Head: Normocephalic and atraumatic.     Nose: Congestion present.  Eyes:     Conjunctiva/sclera: Conjunctivae normal.  Pulmonary:     Effort: Pulmonary effort is normal.  Musculoskeletal:        General: Swelling and tenderness present.     Cervical back: Normal range of motion and neck supple.  Skin:    General: Skin is warm and dry.     Findings: No  bruising.  Neurological:     General: No focal deficit present.     Mental Status: She is alert and oriented to person, place, and time. Mental status is at baseline.  Psychiatric:        Mood and Affect: Mood normal.        Behavior: Behavior normal.        Thought Content: Thought content normal.        Judgment: Judgment normal.       No results found for any visits on 04/23/22.      Assessment & Plan:   Problem List Items Addressed This Visit   None Visit Diagnoses     Injury of left ankle, initial encounter    -  Primary - Xray given duration of pain - If negative, recommend lace up brace for 2-3 weeks, ankle range of motion exercises, ice/heat, rest, elevation    Relevant Orders   DG Ankle Complete Left   Acute non-recurrent pansinusitis    - Given duration, adding Augmentin. - Continue supportive measures including rest, hydration, humidifier use, steam showers, warm compresses to sinuses, warm liquids with lemon and honey, and over-the-counter cough, cold, and analgesics as needed.      Relevant Medications  amoxicillin-clavulanate (AUGMENTIN) 875-125 MG tablet       Meds ordered this encounter  Medications   amoxicillin-clavulanate (AUGMENTIN) 875-125 MG tablet    Sig: Take 1 tablet by mouth 2 (two) times daily for 10 days.    Dispense:  20 tablet    Refill:  0    Order Specific Question:   Supervising Provider    Answer:   Penni Homans A [[id number]]    Return if symptoms worsen or fail to improve.  Terrilyn Saver, NP

## 2022-04-28 DIAGNOSIS — S8262XA Displaced fracture of lateral malleolus of left fibula, initial encounter for closed fracture: Secondary | ICD-10-CM | POA: Diagnosis not present

## 2022-04-28 DIAGNOSIS — M25572 Pain in left ankle and joints of left foot: Secondary | ICD-10-CM | POA: Diagnosis not present

## 2022-04-28 DIAGNOSIS — S93492A Sprain of other ligament of left ankle, initial encounter: Secondary | ICD-10-CM | POA: Diagnosis not present

## 2022-04-30 DIAGNOSIS — Z1231 Encounter for screening mammogram for malignant neoplasm of breast: Secondary | ICD-10-CM | POA: Diagnosis not present

## 2022-04-30 LAB — HM MAMMOGRAPHY

## 2022-05-14 DIAGNOSIS — S93492D Sprain of other ligament of left ankle, subsequent encounter: Secondary | ICD-10-CM | POA: Diagnosis not present

## 2022-05-26 DIAGNOSIS — S93492D Sprain of other ligament of left ankle, subsequent encounter: Secondary | ICD-10-CM | POA: Diagnosis not present

## 2022-06-04 DIAGNOSIS — S93492D Sprain of other ligament of left ankle, subsequent encounter: Secondary | ICD-10-CM | POA: Diagnosis not present

## 2022-06-11 DIAGNOSIS — S93492D Sprain of other ligament of left ankle, subsequent encounter: Secondary | ICD-10-CM | POA: Diagnosis not present

## 2022-06-17 ENCOUNTER — Ambulatory Visit: Payer: Medicare Other | Admitting: Neurology

## 2022-07-26 ENCOUNTER — Ambulatory Visit: Payer: Self-pay

## 2022-07-26 ENCOUNTER — Ambulatory Visit
Admission: RE | Admit: 2022-07-26 | Discharge: 2022-07-26 | Disposition: A | Discharge status: Home / Self Care | Payer: Medicare Other | Source: Ambulatory Visit | Attending: Nurse Practitioner | Admitting: Nurse Practitioner

## 2022-07-26 VITALS — BP 127/84 | HR 74 | Temp 98.5°F | Ht 66.0 in | Wt 170.0 lb

## 2022-07-26 DIAGNOSIS — H6992 Unspecified Eustachian tube disorder, left ear: Secondary | ICD-10-CM

## 2022-07-26 DIAGNOSIS — J320 Chronic maxillary sinusitis: Secondary | ICD-10-CM

## 2022-07-26 MED ORDER — PREDNISONE 20 MG PO TABS: 40.0000 mg | ORAL_TABLET | Freq: Every day | ORAL | 0 refills | Status: AC

## 2022-07-26 MED ORDER — AMOXICILLIN-POT CLAVULANATE 875-125 MG PO TABS: 1.0000 | ORAL_TABLET | Freq: Two times a day (BID) | ORAL | 0 refills | Status: DC

## 2022-07-26 MED ORDER — AZELASTINE HCL 0.15 % NA SOLN: 1.0000 | Freq: Every day | NASAL | 0 refills | Status: DC

## 2022-07-26 NOTE — Discharge Instructions (Signed)
Start Augmentin twice daily for 7 days Azelastine nasal spray daily  Prednisone daily  Rest and fluids Continue nasal rinses as needed Follow up with PCP or ENT if symptoms do not improve Please go to the ER for any worsening symptoms

## 2022-07-26 NOTE — ED Provider Notes (Signed)
[facility]    CSN: [id number] Arrival date & time: 07/26/22  1336      History   Chief Complaint Chief Complaint  Patient presents with   Ear Fullness    HPI Bonnie Brady is a 72 y.o. female presents for evaluation of sinus pressure and ear pain.  Patient reports 6 to 7 days of left-sided maxillary sinus pressure with left ear pain lightest.  Has any fevers, sore throat, cough shortness of breath.  No asthma history.  She does report a history of chronic sinusitis and has seen ENT in the past.  She has been doing OTC Sudafed and Nettie pot for symptoms with minimal relief.  No other concerns at this time.   Ear Fullness    Past Medical History:  Diagnosis Date   Borderline diabetes    COVID-19 10/2018   Hyperlipidemia    Kidney stone    Osteopenia    Tachycardia    normal cardiology workup, no palpitations in 5-6 yrs   Varicose veins     Patient Active Problem List   Diagnosis Date Noted   Sore throat 10/01/2021   Oral thrush 10/01/2021   Atypical chest pain 06/27/2021   Closed fracture of head of left humerus 11/22/2020   Borderline diabetes    Palpitations 01/21/2016   Onychomycosis 09/25/2014   Hyperlipidemia 08/05/2013   Osteoporosis 12/11/2011   Routine general medical examination at a health care facility 11/16/2011   Abnormal EKG 11/16/2011    Past Surgical History:  Procedure Laterality Date   ABDOMINAL HYSTERECTOMY     APPENDECTOMY  1971   bladder strap  2006   CESAREAN SECTION  1978 and 1979   laser of veins  2010   also in 2016 and 2017   MASS EXCISION Right 03/13/2020   Procedure: EXCISION OF RIGHT HIP MASS;  Surgeon: Emelia Loron, MD;  Location: [facility];  Service: General;  Laterality: Right;   TONSILLECTOMY  1958    OB History   No obstetric history on file.      Home Medications    Prior to Admission medications   Medication Sig Start Date End Date Taking? Authorizing Provider   amoxicillin-clavulanate (AUGMENTIN) 875-125 MG tablet Take 1 tablet by mouth every 12 (twelve) hours. 07/26/22  Yes Radford Pax, NP  aspirin EC 81 MG tablet Take 81 mg by mouth daily. Swallow whole.   Yes [provider]  Azelastine HCl 0.15 % SOLN Place 1 spray into both nostrils daily. 07/26/22  Yes Radford Pax, NP  Nutritional Supplements (JUICE PLUS FIBRE PO) Take 4 tablets by mouth in the morning and at bedtime.   Yes [provider]  Omega-3 Fatty Acids (OMEGA 3 PO) Take 1 capsule by mouth in the morning and at bedtime.   Yes [provider]  predniSONE (DELTASONE) 20 MG tablet Take 2 tablets (40 mg total) by mouth daily with breakfast for 5 days. 07/26/22 07/31/22 Yes Radford Pax, NP  rosuvastatin (CRESTOR) 20 MG tablet Take 1 tablet (20 mg total) by mouth daily. 09/04/21  Yes Runell Gess, MD    Family History Family History  Problem Relation Age of Onset   Breast cancer Paternal Grandmother    Stroke Mother    Stroke Maternal Grandfather    COPD Father    COPD Brother    Breast cancer Daughter     Social History Social History   Tobacco Use   Smoking status: Former  Types: Cigarettes    Quit date: 03/30/2008    Years since quitting: 14.3   Smokeless tobacco: Never  Vaping Use   Vaping Use: Never used  Substance Use Topics   Alcohol use: Yes    Comment: occasional    Drug use: No     Allergies   Latex, Tetanus toxoids, and Nickel   Review of Systems Review of Systems  HENT:  Positive for ear pain, sinus pressure, sinus pain and tinnitus.   Neurological:  Positive for dizziness.     Physical Exam Triage Vital Signs ED Triage Vitals  Enc Vitals Group     BP 07/26/22 1354 127/84     Pulse Rate 07/26/22 1354 74     Resp --      Temp 07/26/22 1354 98.5 F (36.9 C)     Temp Source 07/26/22 1354 Oral     SpO2 07/26/22 1354 95 %     Weight 07/26/22 1353 170 lb (77.1 kg)     Height 07/26/22 1353 5\' 6"  (1.676 m)     Head  Circumference --      Peak Flow --      Pain Score 07/26/22 1352 7     Pain Loc --      Pain Edu? --      Excl. in GC? --    No data found.  Updated Vital Signs BP 127/84 (BP Location: Left Arm)   Pulse 74   Temp 98.5 F (36.9 C) (Oral)   Ht 5\' 6"  (1.676 m)   Wt 170 lb (77.1 kg)   SpO2 95%   BMI 27.44 kg/m   Visual Acuity Right Eye Distance:   Left Eye Distance:   Bilateral Distance:    Right Eye Near:   Left Eye Near:    Bilateral Near:     Physical Exam Vitals and nursing note reviewed.  Constitutional:      General: She is not in acute distress.    Appearance: She is well-developed. She is not ill-appearing.  HENT:     Head: Normocephalic and atraumatic.     Right Ear: Tympanic membrane and ear canal normal.     Left Ear: Ear canal normal. A middle ear effusion is present. Tympanic membrane is not erythematous.     Nose: Mucosal edema and congestion present.     Left Turbinates: Pale.     Right Sinus: No maxillary sinus tenderness or frontal sinus tenderness.     Left Sinus: Maxillary sinus tenderness present. No frontal sinus tenderness.     Mouth/Throat:     Mouth: Mucous membranes are moist.     Pharynx: Oropharynx is clear. Uvula midline. No posterior oropharyngeal erythema.     Tonsils: No tonsillar exudate or tonsillar abscesses.  Eyes:     Conjunctiva/sclera: Conjunctivae normal.     Pupils: Pupils are equal, round, and reactive to light.  Cardiovascular:     Rate and Rhythm: Normal rate and regular rhythm.     Heart sounds: Normal heart sounds.  Pulmonary:     Effort: Pulmonary effort is normal.     Breath sounds: Normal breath sounds.  Musculoskeletal:     Cervical back: Normal range of motion and neck supple.  Lymphadenopathy:     Cervical: No cervical adenopathy.  Skin:    General: Skin is warm and dry.  Neurological:     General: No focal deficit present.     Mental Status: She is alert and oriented to person,  place, and time.   Psychiatric:        Mood and Affect: Mood normal.        Behavior: Behavior normal.      UC Treatments / Results  Labs (all labs ordered are listed, but only abnormal results are displayed) Labs Reviewed - No data to display  Comprehensive metabolic panel Order: [id number] Status: Final result     Visible to patient: Yes (seen)     Next appt: 08/11/2022 at 10:30 AM in Neurology Delia Heady, MD)     Dx: New daily persistent headache   1 Result Note     1 Patient Communication          Component Ref Range & Units 5 mo ago (02/03/22) 6 mo ago (01/02/22) 1 yr ago (05/29/21) 1 yr ago (11/22/20) 3 yr ago (02/07/19) 3 yr ago (02/07/19) 3 yr ago (12/22/18)  Sodium 135 - 145 mEq/L 141 142 142 R 142 140  142  Potassium 3.5 - 5.1 mEq/L 4.6 4.5 3.3 Low  R 4.0 3.8  3.7  Chloride 96 - 112 mEq/L 106 108 107 R 106 106  106  CO2 19 - 32 mEq/L 27 26 24  R 26 26  28   Glucose, Bld 70 - 99 mg/dL 92 99 409 High  CM 83 811 High   89  BUN 6 - 23 mg/dL 19 23 18  R 24 High  18  12  Creatinine, Ser 0.40 - 1.20 mg/dL 9.14 7.82 9.56 High  R 0.98 0.94  0.84  Total Bilirubin 0.2 - 1.2 mg/dL 0.4 0.5  0.5  0.4 0.4  Alkaline Phosphatase 39 - 117 U/L 79 76  91  75 63  AST 0 - 37 U/L 18 22  15  21 19   ALT 0 - 35 U/L 18 19  17  27 25   Total Protein 6.0 - 8.3 g/dL 6.8 6.5  6.9  6.4 6.2  Albumin 3.5 - 5.2 g/dL 4.6 4.3  4.4  4.4 4.0  GFR >60.00 mL/min 56.28 Low  54.37 Low  CM  58.85 Low  CM 59.25 Low   67.49  Comment: Calculated using the CKD-EPI Creatinine Equation (2021)  Calcium 8.4 - 10.5 mg/dL 21.3 9.5 9.5 R 08.6 9.4  9.7  Resulting Agency [place] [facility] [place] [facility] [place] [facility] [place] [facility] [place] [facility] [place] [facility]         Specimen Collected: 02/03/22 12:08 Last Resulted: 02/03/22 16:51      Result History    View All Conversations on this Encounter      CM=Additional comments  R=Reference range differs from displayed range          EKG   Radiology No results found.  Procedures Procedures (including critical care time)  Medications Ordered in UC Medications - No data to display  Initial Impression / Assessment and Plan / UC Course  I have reviewed the triage vital signs and the nursing notes.  Pertinent labs & imaging results that were available during my care of the patient were reviewed by me and considered in my medical decision making (see chart for details).     Reviewed recent labs and progress notes Exam and symptoms with patient.  No red flags Start Augmentin given length of symptoms Prednisone daily for eustachian tube dysfunction Nasal spray as prescribed Continue Nettie Potts as tolerated Follow-up with PCP or ENT if symptoms do not improve ER precautions reviewed and patient verbalized understanding Final Clinical Impressions(s) / UC  Diagnoses   Final diagnoses:  Dysfunction of left eustachian tube  Chronic maxillary sinusitis     Discharge Instructions      Start Augmentin twice daily for 7 days Azelastine nasal spray daily  Prednisone daily  Rest and fluids Continue nasal rinses as needed Follow up with PCP or ENT if symptoms do not improve Please go to the ER for any worsening symptoms   ED Prescriptions     Medication Sig Dispense Auth. Provider   Azelastine HCl 0.15 % SOLN Place 1 spray into both nostrils daily. 11 mL Radford Pax, NP   predniSONE (DELTASONE) 20 MG tablet Take 2 tablets (40 mg total) by mouth daily with breakfast for 5 days. 10 tablet Radford Pax, NP   amoxicillin-clavulanate (AUGMENTIN) 875-125 MG tablet Take 1 tablet by mouth every 12 (twelve) hours. 14 tablet Radford Pax, NP      PDMP not reviewed this encounter.   Radford Pax, NP 07/26/22 1419

## 2022-07-26 NOTE — ED Triage Notes (Signed)
Pt c/o left ear pressure and sinus pressure, Tinnitus, Vertigo x6days  Pt states that she has chronic sinus and sees ENT.  Pt has tried sudafed and nettie pot for symptoms.

## 2022-08-05 ENCOUNTER — Ambulatory Visit (INDEPENDENT_AMBULATORY_CARE_PROVIDER_SITE_OTHER): Payer: Medicare Other | Admitting: Family

## 2022-08-05 VITALS — BP 128/76 | HR 72 | Temp 97.8°F | Resp 16 | Wt 173.0 lb

## 2022-08-05 DIAGNOSIS — J0100 Acute maxillary sinusitis, unspecified: Secondary | ICD-10-CM | POA: Diagnosis not present

## 2022-08-05 MED ORDER — AMOXICILLIN-POT CLAVULANATE 875-125 MG PO TABS: 1.0000 | ORAL_TABLET | Freq: Two times a day (BID) | ORAL | 0 refills | Status: AC

## 2022-08-05 MED ORDER — PREDNISONE 10 MG PO TABS: ORAL_TABLET | ORAL | 0 refills | Status: DC

## 2022-08-05 NOTE — Assessment & Plan Note (Signed)
She did not have significant improvement with augmentin x 10 days/plus short burst prednisone. Will repeat augmentin x 14 days plus longer prednisone taper.  She already has an appointment scheduled for mid June with ENT and I have advised her to keep this appointment.

## 2022-08-05 NOTE — Progress Notes (Addendum)
Subjective:   By signing my name below, I, Bonnie Brady, attest that this documentation has been prepared under the direction and in the presence of Bonnie Craze, NP. 08/05/2022   Patient ID: Bonnie Brady, female    DOB: 07-Jul-1950, 72 y.o.   MRN: [id number]  Chief Complaint  Patient presents with   Follow-up    Follow up after er visit for sinusitis   Dizziness    Patient complains of still having dizziness    Tinnitus    Complains of ringing on both ears    HPI Patient is in today for a follow-up appointment.   Urgent Care: She visited the urgent care on 07/26/2022 and was diagnosed with a sinus infection.   Sinus Pain: She is still experiencing ringing in her ears, dizziness, and sinus pain during the time of this visit. She took Augmentin for 10 days and 5 days of prednisone to relieve the pain, but still has symptoms associated with the sinus infection.   Past Medical History:  Diagnosis Date   Borderline diabetes    COVID-19 10/2018   Hyperlipidemia    Kidney stone    Osteopenia    Tachycardia    normal cardiology workup, no palpitations in 5-6 yrs   Varicose veins     Past Surgical History:  Procedure Laterality Date   ABDOMINAL HYSTERECTOMY     APPENDECTOMY  1971   bladder strap  2006   CESAREAN SECTION  1978 and 1979   laser of veins  2010   also in 2016 and 2017   MASS EXCISION Right 03/13/2020   Procedure: EXCISION OF RIGHT HIP MASS;  Surgeon: Emelia Loron, MD;  Location: [facility];  Service: General;  Laterality: Right;   TONSILLECTOMY  1958    Family History  Problem Relation Age of Onset   Breast cancer Paternal Grandmother    Stroke Mother    Stroke Maternal Grandfather    COPD Father    COPD Brother    Breast cancer Daughter     Social History   Socioeconomic History   Marital status: Married    Spouse name: Not on file   Number of children: Not on file   Years of education: Not on file   Highest  education level: Associate degree: occupational, Scientist, product/process development, or vocational program  Occupational History   Not on file  Tobacco Use   Smoking status: Former    Types: Cigarettes    Quit date: 03/30/2008    Years since quitting: 14.3   Smokeless tobacco: Never  Vaping Use   Vaping Use: Never used  Substance and Sexual Activity   Alcohol use: Yes    Comment: occasional    Drug use: No   Sexual activity: Not on file  Other Topics Concern   Not on file  Social History Narrative   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    Married   2 children   Enjoys Insurance risk surveyor      Social Determinants of Health   Financial Resource Strain: Low Risk  (08/04/2022)   Overall Financial Resource Strain (CARDIA)    Difficulty of Paying Living Expenses: Not hard at all  Food Insecurity: No Food Insecurity (08/04/2022)   Hunger Vital Sign    Worried About Running Out of Food in the Last Year: Never true    Ran Out of Food in the Last Year: Never true  Transportation Needs: No Transportation  Needs (08/04/2022)   PRAPARE - Administrator, Civil Service (Medical): No    Lack of Transportation (Non-Medical): No  Physical Activity: Sufficiently Active (08/04/2022)   Exercise Vital Sign    Days of Exercise per Week: 3 days    Minutes of Exercise per Session: 60 min  Stress: No Stress Concern Present (08/04/2022)   [employer] of Occupational Health - Occupational Stress Questionnaire    Feeling of Stress : Not at all  Social Connections: Socially Integrated (08/04/2022)   Social Connection and Isolation Panel [NHANES]    Frequency of Communication with Friends and Family: More than three times a week    Frequency of Social Gatherings with Friends and Family: More than three times a week    Attends Religious Services: More than 4 times per year    Active Member of [employer] or Organizations: Yes    Attends Engineer, structural: More than 4 times  per year    Marital Status: Married  Catering manager Violence: Not At Risk (01/02/2022)   Humiliation, Afraid, Rape, and Kick questionnaire    Fear of Current or Ex-Partner: No    Emotionally Abused: No    Physically Abused: No    Sexually Abused: No    Outpatient Medications Prior to Visit  Medication Sig Dispense Refill   aspirin EC 81 MG tablet Take 81 mg by mouth daily. Swallow whole.     Nutritional Supplements (JUICE PLUS FIBRE PO) Take 4 tablets by mouth in the morning and at bedtime.     Omega-3 Fatty Acids (OMEGA 3 PO) Take 1 capsule by mouth in the morning and at bedtime.     rosuvastatin (CRESTOR) 20 MG tablet Take 1 tablet (20 mg total) by mouth daily. 90 tablet 3   amoxicillin-clavulanate (AUGMENTIN) 875-125 MG tablet Take 1 tablet by mouth every 12 (twelve) hours. 14 tablet 0   Azelastine HCl 0.15 % SOLN Place 1 spray into both nostrils daily. 11 mL 0   No facility-administered medications prior to visit.    Allergies  Allergen Reactions   Latex Rash    Contact with powder.   Tetanus Toxoids Other (See Comments)    Swelling / redness   Nickel Rash    Blisters    ROS     Objective:    Physical Exam Constitutional:      Appearance: Normal appearance.  HENT:     Head: Normocephalic and atraumatic.     Comments: Maxillary sinus tenderness     Right Ear: Tympanic membrane, ear canal and external ear normal.     Left Ear: Tympanic membrane, ear canal and external ear normal.  Eyes:     Extraocular Movements: Extraocular movements intact.     Pupils: Pupils are equal, round, and reactive to light.  Neck:     Comments: Maxillary sinus tenderness  Cardiovascular:     Rate and Rhythm: Normal rate and regular rhythm.     Heart sounds: Normal heart sounds. No murmur heard.    No gallop.  Pulmonary:     Effort: Pulmonary effort is normal. No respiratory distress.     Breath sounds: Normal breath sounds. No wheezing or rales.  Skin:    General: Skin is warm.   Neurological:     Mental Status: She is alert and oriented to person, place, and time.  Psychiatric:        Judgment: Judgment normal.     BP 128/76 (BP Location: Right Arm, Patient  Position: Sitting, Cuff Size: Small)   Pulse 72   Temp 97.8 F (36.6 C) (Oral)   Resp 16   Wt 173 lb (78.5 kg)   SpO2 99%   BMI 27.92 kg/m  Wt Readings from Last 3 Encounters:  08/05/22 173 lb (78.5 kg)  07/26/22 170 lb (77.1 kg)  04/23/22 178 lb (80.7 kg)       Assessment & Plan:  Acute maxillary sinusitis, recurrence not specified Assessment & Plan: She did not have significant improvement with augmentin x 10 days/plus short burst prednisone. Will repeat augmentin x 14 days plus longer prednisone taper.  She already has an appointment scheduled for mid June with ENT and I have advised her to keep this appointment.     Other orders -     predniSONE; 4 tabs by mouth once daily for 2 days, then 3 tabs daily x 2 days, then 2 tabs daily x 2 days, then 1 tab daily x 2 days  Dispense: 20 tablet; Refill: 0 -     Amoxicillin-Pot Clavulanate; Take 1 tablet by mouth 2 (two) times daily for 14 days.  Dispense: 28 tablet; Refill: 0    I, Lemont Fillers, NP, personally preformed the services described in this documentation.  All medical record entries made by the scribe were at my direction and in my presence.  I have reviewed the chart and discharge instructions (if applicable) and agree that the record reflects my personal performance and is accurate and complete. 08/05/2022  Lemont Fillers, NP   [facility] as a scribe for Lemont Fillers, NP.,have documented all relevant documentation on the behalf of Lemont Fillers, NP,as directed by  Lemont Fillers, NP while in the presence of Lemont Fillers, NP.

## 2022-08-11 ENCOUNTER — Ambulatory Visit (INDEPENDENT_AMBULATORY_CARE_PROVIDER_SITE_OTHER): Payer: Medicare Other | Admitting: Neurology

## 2022-08-11 ENCOUNTER — Encounter: Payer: Self-pay | Admitting: Neurology

## 2022-08-11 VITALS — BP 142/79 | HR 77 | Ht 66.0 in | Wt 176.2 lb

## 2022-08-11 DIAGNOSIS — H9313 Tinnitus, bilateral: Secondary | ICD-10-CM | POA: Diagnosis not present

## 2022-08-11 DIAGNOSIS — R519 Headache, unspecified: Secondary | ICD-10-CM | POA: Diagnosis not present

## 2022-08-11 DIAGNOSIS — I639 Cerebral infarction, unspecified: Secondary | ICD-10-CM | POA: Diagnosis not present

## 2022-08-11 NOTE — Patient Instructions (Addendum)
I had a long discussion with the patient with to her recurrent posterior headaches which fortunately appears to have resolved and discussed results of available labs brain imaging and answered questions.   I recommend she do regular neck stretching exercises.  Continue 81 mg daily and aggressive risk factor modification blood pressure goal below 140/90, lipids with LDL cholesterol goal below 70 mg diabetes with hemoglobin A1c goal below 6.5  She was advised to eat a healthy diet with lots of fruits, vegetables, whole grains and be active and exercise regularly and lose weight.  She will return for follow-up in the future in 1 year or call earlier if necessary.   Neck Exercises Ask your health care provider which exercises are safe for you. Do exercises exactly as told by your health care provider and adjust them as directed. It is normal to feel mild stretching, pulling, tightness, or discomfort as you do these exercises. Stop right away if you feel sudden pain or your pain gets worse. Do not begin these exercises until told by your health care provider. Neck exercises can be important for many reasons. They can improve strength and maintain flexibility in your neck, which will help your upper back and prevent neck pain. Stretching exercises Rotation neck stretching  Sit in a chair or stand up. Place your feet flat on the floor, shoulder-width apart. Slowly turn your head (rotate) to the right until a slight stretch is felt. Turn it all the way to the right so you can look over your right shoulder. Do not tilt or tip your head. Hold this position for 10-30 seconds. Slowly turn your head (rotate) to the left until a slight stretch is felt. Turn it all the way to the left so you can look over your left shoulder. Do not tilt or tip your head. Hold this position for 10-30 seconds. Repeat __________ times. Complete this exercise __________ times a day. Neck retraction  Sit in a sturdy chair or stand  up. Look straight ahead. Do not bend your neck. Use your fingers to push your chin backward (retraction). Do not bend your neck for this movement. Continue to face straight ahead. If you are doing the exercise properly, you will feel a slight sensation in your throat and a stretch at the back of your neck. Hold the stretch for 1-2 seconds. Repeat __________ times. Complete this exercise __________ times a day. Strengthening exercises Neck press  Lie on your back on a firm bed or on the floor with a pillow under your head. Use your neck muscles to push your head down on the pillow and straighten your spine. Hold the position as well as you can. Keep your head facing up (in a neutral position) and your chin tucked. Slowly count to 5 while holding this position. Repeat __________ times. Complete this exercise __________ times a day. Isometrics These are exercises in which you strengthen the muscles in your neck while keeping your neck still (isometrics). Sit in a supportive chair and place your hand on your forehead. Keep your head and face facing straight ahead. Do not flex or extend your neck while doing isometrics. Push forward with your head and neck while pushing back with your hand. Hold for 10 seconds. Do the sequence again, this time putting your hand against the back of your head. Use your head and neck to push backward against the hand pressure. Finally, do the same exercise on either side of your head, pushing sideways against the pressure  of your hand. Repeat __________ times. Complete this exercise __________ times a day. Prone head lifts  Lie face-down (prone position), resting on your elbows so that your chest and upper back are raised. Start with your head facing downward, near your chest. Position your chin either on or near your chest. Slowly lift your head upward. Lift until you are looking straight ahead. Then continue lifting your head as far back as you can comfortably  stretch. Hold your head up for 5 seconds. Then slowly lower it to your starting position. Repeat __________ times. Complete this exercise __________ times a day. Supine head lifts  Lie on your back (supine position), bending your knees to point to the ceiling and keeping your feet flat on the floor. Lift your head slowly off the floor, raising your chin toward your chest. Hold for 5 seconds. Repeat __________ times. Complete this exercise __________ times a day. Scapular retraction  Stand with your arms at your sides. Look straight ahead. Slowly pull both shoulders (scapulae) backward and downward (retraction) until you feel a stretch between your shoulder blades in your upper back. Hold for 10-30 seconds. Relax and repeat. Repeat __________ times. Complete this exercise __________ times a day. Contact a health care provider if: Your neck pain or discomfort gets worse when you do an exercise. Your neck pain or discomfort does not improve within 2 hours after you exercise. If you have any of these problems, stop exercising right away. Do not do the exercises again unless your health care provider says that you can. Get help right away if: You develop sudden, severe neck pain. If this happens, stop exercising right away. Do not do the exercises again unless your health care provider says that you can. This information is not intended to replace advice given to you by your health care provider. Make sure you discuss any questions you have with your health care provider. Document Revised: 09/10/2020 Document Reviewed: 09/10/2020 Elsevier Patient Education  2023 [employer].

## 2022-08-11 NOTE — Progress Notes (Signed)
[facility] [address] [place]. [place] [address] [phone]       OFFICE FOLLOW-UP VISIT NOTE  [name] Date of Birth:  February 02, 1951 Medical Record Number:  [id number]   Referring OZ:[id number] Chase  Reason for Referral: Abnormal CT scan and headache  HPI: Initial visit 02/10/2022 Bonnie Brady is a pleasant 72 year old Caucasian lady seen today for initial office consultation visit for abnormal CT scan of the head.  She is accompanied by her husband.  History is obtained  them and review of electronic medical records, referral notes and I personally reviewed pertinent available imaging films in PACS.  She has past medical history of hyperlipidemia, borderline diabetes, osteopenia and tachycardia.  She states that she woke up on 01/30/2022 with a feeling of irritation in the scalp on the top of the head.  He describes this as a constant pressure and discomfort.  He went for a hair dressing appointment and noticed that there was some redness at that site.  She took Aleve 200 mg 1 tablet this did not help.  This lasted for 10 straight days and was constant mild in intensity but not disabling.  She denied any loss of vision, blurred vision, scalp tenderness, jaw claudication.  She has no prior history of migraines headaches symptomatic.  Surprisingly this morning she noticed that the headache is gone and has not come back.  She saw her primary care physician who did lab work on 02/03/2022 and ESR was normal CMP and CBC were unremarkable.  CT scan of the head was done on 02/04/2022 which I personally reviewed shows no acute abnormality.  There is area of low density in the left internal capsule suggestive of remote age lacunar infarct.  There are also changes of age-related small vessel disease.  Patient denies any prior history of strokes or TIAs or focal extremity weakness, slurred speech right-sided weakness to go with subcortical infarct noted on CT scan.  She does however  have family history of strokes in her mother as well as maternal grandfather.  He has prior history of chest pain and tachycardia and sees Dr. Nanetta Batty.  Aspirin daily as well as Crestor.  Lab work on 01/02/2022 showed hemoglobin A1c to be borderline at 6.2 LDL cholesterol was 66 mg percent on 12/08/21.  She has no complaints today.  Update 08/11/2022 : She returns for follow-up after last visit 5 months ago.  She is accompanied by her husband.  He had recurrence of occipital headaches a few weeks after she saw me.  This lasted for couple of weeks then resolved.  Headache was mild and she did not need any medications.  She did undergo an MRI scan of the brain on 03/07/2022 which shows age-related changes of chronic small vessel disease.  MR angiogram of the neck shows no significant large vessel stenosis.  MR angiogram of the brain shows occlusion of the right superior division in the mid portion and moderate stenosis of the left posterior cerebral artery in the P2 segment.  Patient remains on aspirin she is tolerating well without bruising or bleeding.  His blood pressure is in the good control.  She is tolerating Crestor well without side effects.  Patient has episode of sinusitis.  2 weeks ago has developed new onset of tinnitus in both ears.  She was treated with antibiotics and prednisone without resolution.  She is currently finishing up course of antibiotics of 2 weeks of Augmentin and home.  She has an appointment  to see an ENT.  She denies any decreased hearing or vertigo.  He has not had any stroke or TIA symptoms  ROS:   14 system review of systems is positive for headache, scalp irritation, pressure and all other systems negative  PMH:  Past Medical History:  Diagnosis Date   Borderline diabetes    COVID-19 10/2018   Hyperlipidemia    Kidney stone    Osteopenia    Tachycardia    normal cardiology workup, no palpitations in 5-6 yrs   Varicose veins     Social History:  Social  History   Socioeconomic History   Marital status: Married    Spouse name: Not on file   Number of children: Not on file   Years of education: Not on file   Highest education level: Associate degree: occupational, Scientist, product/process development, or vocational program  Occupational History   Not on file  Tobacco Use   Smoking status: Former    Types: Cigarettes    Quit date: 03/30/2008    Years since quitting: 14.3   Smokeless tobacco: Never  Vaping Use   Vaping Use: Never used  Substance and Sexual Activity   Alcohol use: Yes    Comment: occasional    Drug use: No   Sexual activity: Not on file  Other Topics Concern   Not on file  Social History Narrative   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    Married   2 children   Enjoys Insurance risk surveyor      Social Determinants of Health   Financial Resource Strain: Low Risk  (08/04/2022)   Overall Financial Resource Strain (CARDIA)    Difficulty of Paying Living Expenses: Not hard at all  Food Insecurity: No Food Insecurity (08/04/2022)   Hunger Vital Sign    Worried About Running Out of Food in the Last Year: Never true    Ran Out of Food in the Last Year: Never true  Transportation Needs: No Transportation Needs (08/04/2022)   PRAPARE - Administrator, Civil Service (Medical): No    Lack of Transportation (Non-Medical): No  Physical Activity: Sufficiently Active (08/04/2022)   Exercise Vital Sign    Days of Exercise per Week: 3 days    Minutes of Exercise per Session: 60 min  Stress: No Stress Concern Present (08/04/2022)   [employer] of Occupational Health - Occupational Stress Questionnaire    Feeling of Stress : Not at all  Social Connections: Socially Integrated (08/04/2022)   Social Connection and Isolation Panel [NHANES]    Frequency of Communication with Friends and Family: More than three times a week    Frequency of Social Gatherings with Friends and Family: More than three times a  week    Attends Religious Services: More than 4 times per year    Active Member of [employer] or Organizations: Yes    Attends Engineer, structural: More than 4 times per year    Marital Status: Married  Catering manager Violence: Not At Risk (01/02/2022)   Humiliation, Afraid, Rape, and Kick questionnaire    Fear of Current or Ex-Partner: No    Emotionally Abused: No    Physically Abused: No    Sexually Abused: No    Medications:   Current Outpatient Medications on File Prior to Visit  Medication Sig Dispense Refill   amoxicillin-clavulanate (AUGMENTIN) 875-125 MG tablet Take 1 tablet by mouth 2 (two) times daily for 14 days. 28 tablet  0   aspirin EC 81 MG tablet Take 81 mg by mouth daily. Swallow whole.     Nutritional Supplements (JUICE PLUS FIBRE PO) Take 4 tablets by mouth in the morning and at bedtime.     Omega-3 Fatty Acids (OMEGA 3 PO) Take 1 capsule by mouth in the morning and at bedtime.     predniSONE (DELTASONE) 10 MG tablet 4 tabs by mouth once daily for 2 days, then 3 tabs daily x 2 days, then 2 tabs daily x 2 days, then 1 tab daily x 2 days 20 tablet 0   rosuvastatin (CRESTOR) 20 MG tablet Take 1 tablet (20 mg total) by mouth daily. 90 tablet 3   No current facility-administered medications on file prior to visit.    Allergies:   Allergies  Allergen Reactions   Latex Rash    Contact with powder.   Tetanus Toxoids Other (See Comments)    Swelling / redness   Nickel Rash    Blisters    Physical Exam General: well developed, well nourished pleasant middle-age Caucasian lady, seated, in no evident distress Head: head normocephalic and atraumatic.  No tenderness of superficial temporal arteries bilaterally. Neck: supple with no carotid or supraclavicular bruits Cardiovascular: regular rate and rhythm, no murmurs Musculoskeletal: no deformity.  Mild tightness of posterior neck muscles without restriction of movements. Skin:  no rash/petichiae Vascular:  Normal  pulses all extremities  Neurologic Exam Mental Status: Awake and fully alert. Oriented to place and time. Recent and remote memory intact. Attention span, concentration and fund of knowledge appropriate. Mood and affect appropriate.  Cranial Nerves: Fundoscopic exam not done s. Pupils equal, briskly reactive to light. Extraocular movements full without nystagmus. Visual fields full to confrontation. Hearing intact. Facial sensation intact. Face, tongue, palate moves normally and symmetrically.  Motor: Normal bulk and tone. Normal strength in all tested extremity muscles. Sensory.: intact to touch , pinprick , position and vibratory sensation.  Coordination: Rapid alternating movements normal in all extremities. Finger-to-nose and heel-to-shin performed accurately bilaterally. Gait and Station: Arises from chair without difficulty. Stance is normal. Gait demonstrates normal stride length and balance . Able to heel, toe and tandem walk with slight difficulty.  Reflexes: 1+ and symmetric. Toes downgoing.      ASSESSMENT: 72 year old Caucasian lady with recurrent transient posterior headache of unclear etiology possibly tension headaches with appears to have resolved.  Clinical symptomatology and lab work do not suggest temporal arteritis.  Abnormal brain imaging suggestive of left subcortical silent lacunar infarct with no clinical symptomatology to suggest prior TIA or stroke.  Vascular risk factors of mild hyperlipidemia and borderline diabetes only.     PLAN: I had a long discussion with the patient with to her recurrent posterior headaches which fortunately appears to have resolved and discussed results of available labs brain imaging and answered questions.   I recommend she do regular neck stretching exercises.  Continue 81 mg daily and aggressive risk factor modification blood pressure goal below 140/90, lipids with LDL cholesterol goal below 70 mg diabetes with hemoglobin A1c goal below 6.5   She was advised to eat a healthy diet with lots of fruits, vegetables, whole grains and be active and exercise regularly and lose weight.  She will return for follow-up in the future in 1 year or call earlier if necessary.  Greater than 50% time during this 35-minute visit was spent on counseling and coordination of care about her new headache as well as abnormal CT scan and  need for further evaluation and treatment and  Delia Heady, MD Note: This document was prepared with digital dictation and possible smart phrase technology. Any transcriptional errors that result from this process are unintentional.

## 2022-08-27 NOTE — Progress Notes (Signed)
Subjective:   By signing my name below, I, Bonnie Brady, attest that this documentation has been prepared under the direction and in the presence of Bonnie Fillers, NP 08/28/22   Patient ID: Bonnie Brady, female    DOB: 07-Sep-1950, 72 y.o.   MRN: [id number]  Chief Complaint  Patient presents with   Dizziness    Complains of having dizziness for 3 days   Sinus Problem    Complains of still having sinus congestion    HPI Patient is in today for an office visit.   Vertigo:  She complains of ear pain, pressure, and ringing starting Monday (5/27). She states dizziness started on 5/29. She had temporary relief with Augmentin and prednisone, but symptoms returned on Monday (5/27). She has an appointment with ENT scheduled 6/21.   Sinus congestion: She complains of sinus congestion. She has been taking Zyrtec, Sudafed, and Flonase. She has seen an allergist and had an allergy test.  Past Medical History:  Diagnosis Date   Borderline diabetes    COVID-19 10/2018   Hyperlipidemia    Kidney stone    Osteopenia    Tachycardia    normal cardiology workup, no palpitations in 5-6 yrs   Varicose veins     Past Surgical History:  Procedure Laterality Date   ABDOMINAL HYSTERECTOMY     APPENDECTOMY  1971   bladder strap  2006   CESAREAN SECTION  1978 and 1979   laser of veins  2010   also in 2016 and 2017   MASS EXCISION Right 03/13/2020   Procedure: EXCISION OF RIGHT HIP MASS;  Surgeon: Emelia Loron, MD;  Location: [facility];  Service: General;  Laterality: Right;   TONSILLECTOMY  1958    Family History  Problem Relation Age of Onset   Breast cancer Paternal Grandmother    Stroke Mother    Stroke Maternal Grandfather    COPD Father    COPD Brother    Breast cancer Daughter     Social History   Socioeconomic History   Marital status: Married    Spouse name: Not on file   Number of children: Not on file   Years of education: Not on file    Highest education level: Associate degree: occupational, Scientist, product/process development, or vocational program  Occupational History   Not on file  Tobacco Use   Smoking status: Former    Types: Cigarettes    Quit date: 03/30/2008    Years since quitting: 14.4   Smokeless tobacco: Never  Vaping Use   Vaping Use: Never used  Substance and Sexual Activity   Alcohol use: Yes    Comment: occasional    Drug use: No   Sexual activity: Not on file  Other Topics Concern   Not on file  Social History Narrative   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    Married   2 children   Enjoys Insurance risk surveyor      Social Determinants of Health   Financial Resource Strain: Low Risk  (08/04/2022)   Overall Financial Resource Strain (CARDIA)    Difficulty of Paying Living Expenses: Not hard at all  Food Insecurity: No Food Insecurity (08/04/2022)   Hunger Vital Sign    Worried About Running Out of Food in the Last Year: Never true    Ran Out of Food in the Last Year: Never true  Transportation Needs: No Transportation Needs (08/04/2022)   PRAPARE - Transportation  Lack of Transportation (Medical): No    Lack of Transportation (Non-Medical): No  Physical Activity: Sufficiently Active (08/04/2022)   Exercise Vital Sign    Days of Exercise per Week: 3 days    Minutes of Exercise per Session: 60 min  Stress: No Stress Concern Present (08/04/2022)   [employer] of Occupational Health - Occupational Stress Questionnaire    Feeling of Stress : Not at all  Social Connections: Socially Integrated (08/04/2022)   Social Connection and Isolation Panel [NHANES]    Frequency of Communication with Friends and Family: More than three times a week    Frequency of Social Gatherings with Friends and Family: More than three times a week    Attends Religious Services: More than 4 times per year    Active Member of [employer] or Organizations: Yes    Attends Engineer, structural: More than  4 times per year    Marital Status: Married  Catering manager Violence: Not At Risk (01/02/2022)   Humiliation, Afraid, Rape, and Kick questionnaire    Fear of Current or Ex-Partner: No    Emotionally Abused: No    Physically Abused: No    Sexually Abused: No    Outpatient Medications Prior to Visit  Medication Sig Dispense Refill   aspirin EC 81 MG tablet Take 81 mg by mouth daily. Swallow whole.     Nutritional Supplements (JUICE PLUS FIBRE PO) Take 4 tablets by mouth in the morning and at bedtime.     Omega-3 Fatty Acids (OMEGA 3 PO) Take 1 capsule by mouth in the morning and at bedtime.     rosuvastatin (CRESTOR) 20 MG tablet Take 1 tablet (20 mg total) by mouth daily. 90 tablet 3   predniSONE (DELTASONE) 10 MG tablet 4 tabs by mouth once daily for 2 days, then 3 tabs daily x 2 days, then 2 tabs daily x 2 days, then 1 tab daily x 2 days 20 tablet 0   No facility-administered medications prior to visit.    Allergies  Allergen Reactions   Latex Rash    Contact with powder.   Tetanus Toxoids Other (See Comments)    Swelling / redness   Nickel Rash    Blisters    Review of Systems  HENT:  Positive for congestion (sinus) and ear pain.        (+) ear pressure (+) ear ringing  Neurological:  Positive for dizziness.       Objective:    Physical Exam Constitutional:      General: She is not in acute distress.    Appearance: Normal appearance. She is well-developed.  HENT:     Head: Normocephalic and atraumatic.     Right Ear: Tympanic membrane, ear canal and external ear normal.     Left Ear: Tympanic membrane, ear canal and external ear normal.  Eyes:     General: No scleral icterus. Neck:     Thyroid: No thyromegaly.  Cardiovascular:     Rate and Rhythm: Normal rate and regular rhythm.     Heart sounds: Normal heart sounds. No murmur heard. Pulmonary:     Effort: Pulmonary effort is normal. No respiratory distress.     Breath sounds: Normal breath sounds. No  wheezing.  Musculoskeletal:     Cervical back: Neck supple.  Skin:    General: Skin is warm and dry.  Neurological:     Mental Status: She is alert and oriented to person, place, and time.  Comments: Mildly positive Dix-Hallpike bilaterally  Psychiatric:        Mood and Affect: Mood normal.        Behavior: Behavior normal.        Thought Content: Thought content normal.        Judgment: Judgment normal.     BP 121/76 (BP Location: Right Arm, Patient Position: Sitting, Cuff Size: Small)   Pulse 84   Temp (!) 97.5 F (36.4 C) (Oral)   Resp 16   Wt 175 lb (79.4 kg)   SpO2 99%   BMI 28.25 kg/m  Wt Readings from Last 3 Encounters:  08/28/22 175 lb (79.4 kg)  08/11/22 176 lb 3.2 oz (79.9 kg)  08/05/22 173 lb (78.5 kg)       Assessment & Plan:  Benign paroxysmal positional vertigo due to bilateral vestibular disorder Assessment & Plan: New. Trial of low dose meclizine.  She is advised to let me know if her symptoms worsen or if they are not improved in 1-2 weeks. Would consider referral for Vestibular Rehab at that time.   Orders: -     Meclizine HCl; Take 1 tablet (12.5 mg total) by mouth 3 (three) times daily as needed for dizziness.  Dispense: 30 tablet; Refill: 0  Chronic sinusitis, unspecified location Assessment & Plan: She has had an extended Brady of antibiotics and steroids.  I do not think that there is an infectious component at this time. She is using astepro/flonase, zyrtec, neti pot and decongestants.  I have advised her to keep her upcoming appointment with ENT.        I,Rachel Rivera,acting as a Neurosurgeon for Bonnie Fillers, NP.,have documented all relevant documentation on the behalf of Bonnie Fillers, NP,as directed by  Bonnie Fillers, NP while in the presence of Bonnie Fillers, NP.   I, Bonnie Fillers, NP, personally preformed the services described in this documentation.  All medical record entries made by the scribe were  at my direction and in my presence.  I have reviewed the chart and discharge instructions (if applicable) and agree that the record reflects my personal performance and is accurate and complete. 08/28/22   Bonnie Fillers, NP

## 2022-08-28 ENCOUNTER — Ambulatory Visit (INDEPENDENT_AMBULATORY_CARE_PROVIDER_SITE_OTHER): Payer: Medicare Other | Admitting: Family

## 2022-08-28 VITALS — BP 121/76 | HR 84 | Temp 97.5°F | Resp 16 | Wt 175.0 lb

## 2022-08-28 DIAGNOSIS — H8113 Benign paroxysmal vertigo, bilateral: Secondary | ICD-10-CM | POA: Diagnosis not present

## 2022-08-28 DIAGNOSIS — J329 Chronic sinusitis, unspecified: Secondary | ICD-10-CM | POA: Diagnosis not present

## 2022-08-28 MED ORDER — MECLIZINE HCL 12.5 MG PO TABS: 12.5000 mg | ORAL_TABLET | Freq: Three times a day (TID) | ORAL | 0 refills | Status: DC | PRN

## 2022-08-28 NOTE — Assessment & Plan Note (Addendum)
She has had an extended course of antibiotics and steroids.  I do not think that there is an infectious component at this time. She is using astepro/flonase, zyrtec, neti pot and decongestants.  I have advised her to keep her upcoming appointment with ENT.

## 2022-08-28 NOTE — Assessment & Plan Note (Signed)
New. Trial of low dose meclizine.  She is advised to let me know if her symptoms worsen or if they are not improved in 1-2 weeks. Would consider referral for Vestibular Rehab at that time.

## 2022-09-07 DIAGNOSIS — S76312A Strain of muscle, fascia and tendon of the posterior muscle group at thigh level, left thigh, initial encounter: Secondary | ICD-10-CM | POA: Diagnosis not present

## 2022-09-07 DIAGNOSIS — S86112A Strain of other muscle(s) and tendon(s) of posterior muscle group at lower leg level, left leg, initial encounter: Secondary | ICD-10-CM | POA: Diagnosis not present

## 2022-09-07 DIAGNOSIS — M25572 Pain in left ankle and joints of left foot: Secondary | ICD-10-CM | POA: Diagnosis not present

## 2022-09-12 ENCOUNTER — Ambulatory Visit
Admission: RE | Admit: 2022-09-12 | Discharge: 2022-09-12 | Disposition: A | Discharge status: Home / Self Care | Payer: Medicare Other | Source: Ambulatory Visit | Attending: Emergency Medicine | Admitting: Emergency Medicine

## 2022-09-12 VITALS — BP 129/88 | HR 70 | Temp 97.8°F | Resp 17 | Ht 66.0 in | Wt 169.8 lb

## 2022-09-12 DIAGNOSIS — S0502XA Injury of conjunctiva and corneal abrasion without foreign body, left eye, initial encounter: Secondary | ICD-10-CM

## 2022-09-12 MED ORDER — TOBRAMYCIN-DEXAMETHASONE 0.3-0.1 % OP SUSP: 2.0000 [drp] | Freq: Four times a day (QID) | OPHTHALMIC | 0 refills | Status: DC

## 2022-09-12 NOTE — ED Provider Notes (Signed)
[facility]    CSN: [id number] Arrival date & time: 09/12/22  1227      History   Chief Complaint Chief Complaint  Patient presents with   Eye Problem    Entered by patient    HPI Bonnie Brady is a 72 y.o. female.   HPI  72 year old female with a past medical history significant for hyperlipidemia, borderline diabetes, osteopenia, and renal stones presents for evaluation of left eye irritation.  She reports that it started earlier in the week and she thought she may have gotten dust or something under 1 of her contacts.  She reports that she removed her contacts and her symptoms improved.  Last night her left eye began to water and also become itchy.  This morning she woke up and she had mucopurulent discharge in her lashes.  Patient's visual acuity is OD 20/70, OS 20/200, OU 20/70.  She is not wearing her contacts currently.  Past Medical History:  Diagnosis Date   Borderline diabetes    COVID-19 10/2018   Hyperlipidemia    Kidney stone    Osteopenia    Tachycardia    normal cardiology workup, no palpitations in 5-6 yrs   Varicose veins     Patient Active Problem List   Diagnosis Date Noted   Chronic sinusitis 08/28/2022   Benign paroxysmal positional vertigo due to bilateral vestibular disorder 08/28/2022   Oral thrush 10/01/2021   Atypical chest pain 06/27/2021   Closed fracture of head of left humerus 11/22/2020   Borderline diabetes    Palpitations 01/21/2016   Onychomycosis 09/25/2014   Hyperlipidemia 08/05/2013   Osteoporosis 12/11/2011   Routine general medical examination at a health care facility 11/16/2011   Abnormal EKG 11/16/2011    Past Surgical History:  Procedure Laterality Date   ABDOMINAL HYSTERECTOMY     APPENDECTOMY  1971   bladder strap  2006   CESAREAN SECTION  1978 and 1979   laser of veins  2010   also in 2016 and 2017   MASS EXCISION Right 03/13/2020   Procedure: EXCISION OF RIGHT HIP MASS;  Surgeon: Emelia Loron, MD;  Location: [facility];  Service: General;  Laterality: Right;   TONSILLECTOMY  1958    OB History   No obstetric history on file.      Home Medications    Prior to Admission medications   Medication Sig Start Date End Date Taking? Authorizing Provider  tobramycin-dexamethasone [facility]) ophthalmic solution Place 2 drops into the right eye every 6 (six) hours. 09/12/22  Yes Becky Augusta, NP  aspirin EC 81 MG tablet Take 81 mg by mouth daily. Swallow whole.    [provider]  Nutritional Supplements (JUICE PLUS FIBRE PO) Take 4 tablets by mouth in the morning and at bedtime.    [provider]  Omega-3 Fatty Acids (OMEGA 3 PO) Take 1 capsule by mouth in the morning and at bedtime.    [provider]  rosuvastatin (CRESTOR) 20 MG tablet Take 1 tablet (20 mg total) by mouth daily. 09/04/21   Runell Gess, MD    Family History Family History  Problem Relation Age of Onset   Stroke Mother    COPD Father    COPD Brother    Stroke Maternal Grandfather    Breast cancer Paternal Grandmother    Breast cancer Daughter     Social History Social History   Tobacco Use   Smoking status: Former  Types: Cigarettes    Quit date: 03/30/2008    Years since quitting: 14.4   Smokeless tobacco: Never  Vaping Use   Vaping Use: Never used  Substance Use Topics   Alcohol use: Yes    Comment: occasional    Drug use: No     Allergies   Latex, Tetanus toxoids, and Nickel   Review of Systems Review of Systems  Constitutional:  Negative for fever.  Eyes:  Positive for pain, discharge, redness, itching and visual disturbance. Negative for photophobia.     Physical Exam Triage Vital Signs ED Triage Vitals  Enc Vitals Group     BP 09/12/22 1238 129/88     Pulse Rate 09/12/22 1238 70     Resp 09/12/22 1238 17     Temp 09/12/22 1238 97.8 F (36.6 C)     Temp Source 09/12/22 1238 Oral     SpO2 09/12/22 1238 95 %     Weight  09/12/22 1240 169 lb 12.1 oz (77 kg)     Height 09/12/22 1240 5\' 6"  (1.676 m)     Head Circumference --      Peak Flow --      Pain Score 09/12/22 1240 1     Pain Loc --      Pain Edu? --      Excl. in GC? --    No data found.  Updated Vital Signs BP 129/88 (BP Location: Right Arm)   Pulse 70   Temp 97.8 F (36.6 C) (Oral)   Resp 17   Ht 5\' 6"  (1.676 m)   Wt 169 lb 12.1 oz (77 kg)   SpO2 95%   BMI 27.40 kg/m   Visual Acuity Right Eye Distance:   Left Eye Distance:   Bilateral Distance:    Right Eye Near:   Left Eye Near:    Bilateral Near:     Physical Exam Vitals and nursing note reviewed.  Constitutional:      Appearance: Normal appearance. She is not ill-appearing.  HENT:     Head: Normocephalic and atraumatic.  Eyes:     General: No scleral icterus.       Right eye: No discharge.        Left eye: No discharge.     Extraocular Movements: Extraocular movements intact.     Conjunctiva/sclera: Conjunctivae normal.     Pupils: Pupils are equal, round, and reactive to light.  Skin:    General: Skin is warm and dry.     Capillary Refill: Capillary refill takes less than 2 seconds.  Neurological:     Mental Status: She is alert.      UC Treatments / Results  Labs (all labs ordered are listed, but only abnormal results are displayed) Labs Reviewed - No data to display  EKG   Radiology No results found.  Procedures Procedures (including critical care time)  Medications Ordered in UC Medications - No data to display  Initial Impression / Assessment and Plan / UC Course  I have reviewed the triage vital signs and the nursing notes.  Pertinent labs & imaging results that were available during my care of the patient were reviewed by me and considered in my medical decision making (see chart for details).   Patient is a pleasant, nontoxic-appearing 72 year old female presenting for evaluation of left eye irritation as outlined HPI above. On exam the  patient does not have any injection to the bulbar or labral conjunctiva and her sclera are  white and quiet.  Pupils are equal round reactive and EOMs intact.  She has normal red light reflex in her left eye.  No visible foreign body noted on exam.  I did instill 2 drops of tetracaine in the patient's left eye and then in instilled fluorescein dye for examination under Woods lamp.  As you can see in image above, there is a corneal abrasion on the lateral and inferior aspect of the iris at the 4 o'clock position.  I have advised the patient that she is to avoid wearing her contacts for the next week to allow her cornea to heal.  I will start her on TobraDex 2 drops in her left eye 4 times a day for 5 days.  Also advised the patient that if she develops any increased pain, redness in her eye, or additional changes in her vision that she is to follow-up with her ophthalmologist.  Final Clinical Impressions(s) / UC Diagnoses   Final diagnoses:  Abrasion of left cornea, initial encounter     Discharge Instructions      Instill 2 drops of TobraDex in your right eye 4 times a day for 5 days.  Abstain from wearing contacts until your symptoms have completely resolved.  When you do resume wearing contacts start with a fresh pair.  Make sure that you are storing your contracts in a clean with clean saline nightly.  Wash your contacts with an enzymatic cleaner once weekly and rinse them thoroughly with new, clean contact lens solution to remove any enzymatic residue.  If you have any worsening of your symptoms such as increased pain, increased redness, increased photosensitivity, or changes in your vision you need to follow-up with your eye doctor.      ED Prescriptions     Medication Sig Dispense Auth. Provider   tobramycin-dexamethasone [facility]) ophthalmic solution Place 2 drops into the right eye every 6 (six) hours. 5 mL Becky Augusta, NP      PDMP not reviewed this encounter.   Becky Augusta, NP 09/12/22 1320

## 2022-09-12 NOTE — ED Triage Notes (Signed)
Pt stated last week she felt like something was in her left eye. Draining during the day and woke up with crusty matter. Complains of itching and feels like sand in her eye  VISUAL ACUITY (uncorrected) Right eye: 20/70 Left eye: 20/200 Both eyes: 20/70

## 2022-09-12 NOTE — Discharge Instructions (Signed)
Instill 2 drops of TobraDex in your right eye 4 times a day for 5 days.  Abstain from wearing contacts until your symptoms have completely resolved.  When you do resume wearing contacts start with a fresh pair.  Make sure that you are storing your contracts in a clean with clean saline nightly.  Wash your contacts with an enzymatic cleaner once weekly and rinse them thoroughly with new, clean contact lens solution to remove any enzymatic residue.  If you have any worsening of your symptoms such as increased pain, increased redness, increased photosensitivity, or changes in your vision you need to follow-up with your eye doctor.  

## 2022-10-02 ENCOUNTER — Other Ambulatory Visit: Payer: Self-pay | Admitting: Cardiovascular Disease

## 2022-10-12 DIAGNOSIS — M5432 Sciatica, left side: Secondary | ICD-10-CM | POA: Diagnosis not present

## 2022-10-12 DIAGNOSIS — M79605 Pain in left leg: Secondary | ICD-10-CM | POA: Diagnosis not present

## 2022-10-15 DIAGNOSIS — M79605 Pain in left leg: Secondary | ICD-10-CM | POA: Diagnosis not present

## 2022-10-15 DIAGNOSIS — M5432 Sciatica, left side: Secondary | ICD-10-CM | POA: Diagnosis not present

## 2022-10-16 DIAGNOSIS — M79605 Pain in left leg: Secondary | ICD-10-CM | POA: Diagnosis not present

## 2022-10-16 DIAGNOSIS — M5432 Sciatica, left side: Secondary | ICD-10-CM | POA: Diagnosis not present

## 2022-10-16 IMAGING — CT CT CARDIAC CORONARY ARTERY CALCIUM SCORE
3 series · 14 of 20 positions shown, 16 images · non-contrast
Comparison: PET-CT 06/01/2009
COMPARISON: PET-CT 06/01/2009

Addendum:
EXAM:
OVER-READ INTERPRETATION  CT CHEST

The following report is an over-read performed by radiologist Dr.
over-read does not include interpretation of cardiac or coronary
anatomy or pathology. The coronary calcium score interpretation by
the cardiologist will be attached.
CLINICAL DATA: 71F for cardiovascular disease risk stratification
Coronary Calcium Score
TECHNIQUE: A gated, non-contrast computed tomography scan of the heart was
performed using 3mm slice thickness. Axial images were analyzed on a
dedicated workstation. Calcium scoring of the coronary arteries was
performed using the Agatston method.

[Series 2: cascseq 2.0 sa36 70% (id) · axial · 0.42mm/px · z∈[-175,-85]mm · 4 of 76 slices shown]
[im 16/76  vessel]
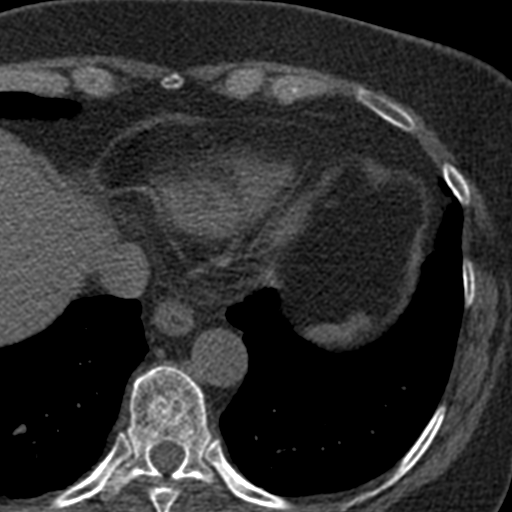
[im 31/76  vessel]
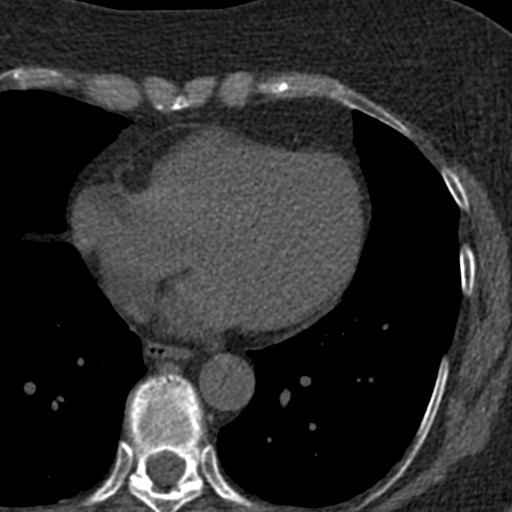
[im 46/76  vessel]
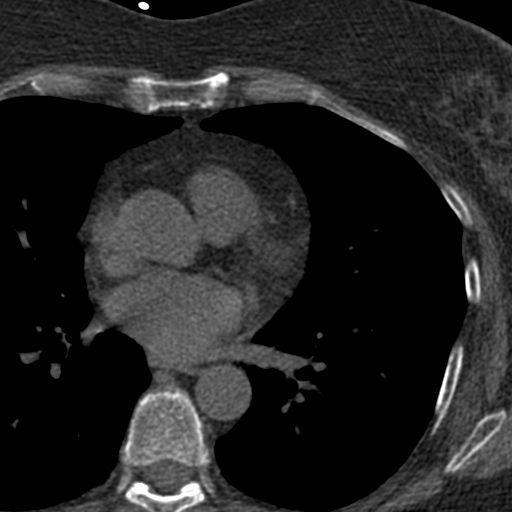
[im 61/76  vessel]
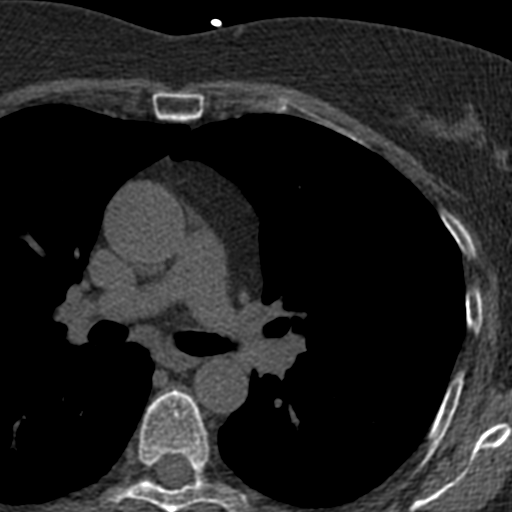

[Series 3: cascseq 2.0 bf37 st · axial · 0.69mm/px · z∈[-181,-81]mm · 5 of 76 slices shown, 7 images]
[im 13/76  vessel]
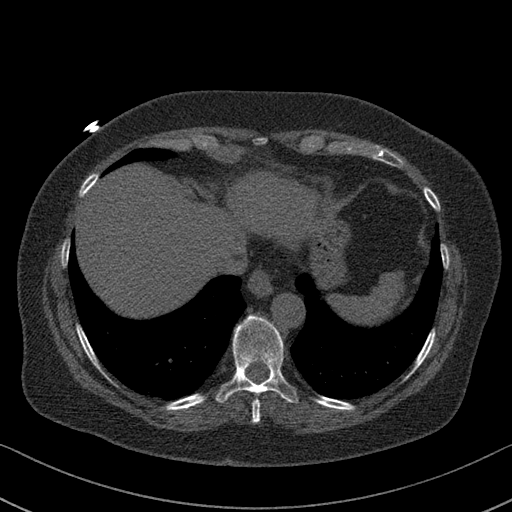
[im 13/76  lung]
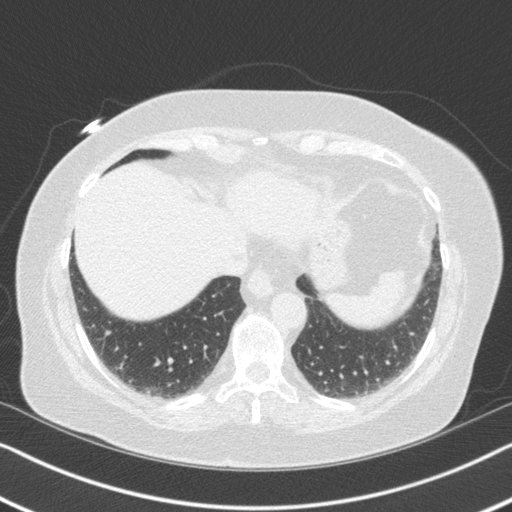
[im 26/76  vessel]
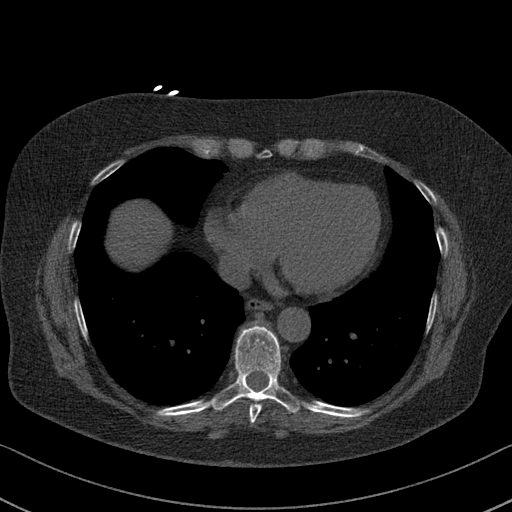
[im 38/76  vessel]
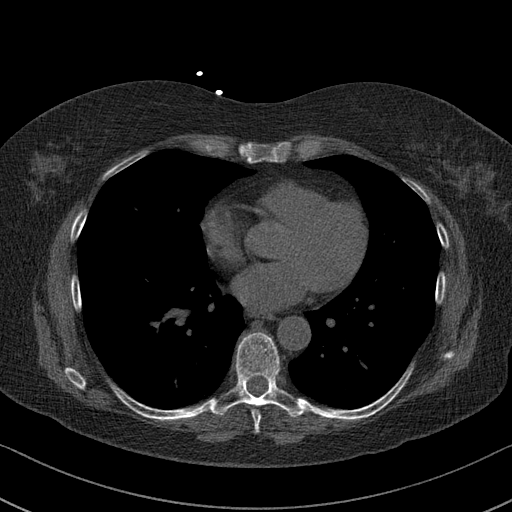
[im 51/76  vessel]
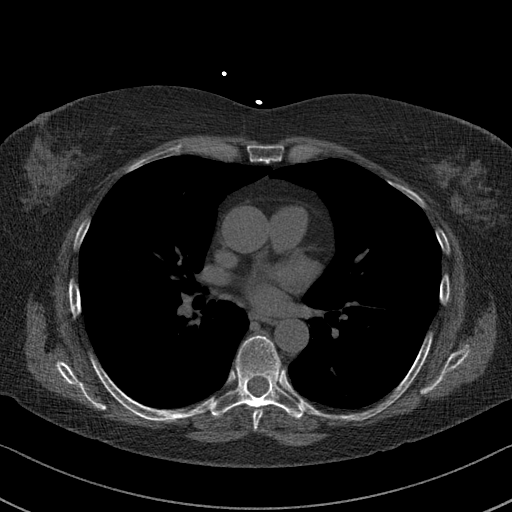
[im 63/76  vessel]
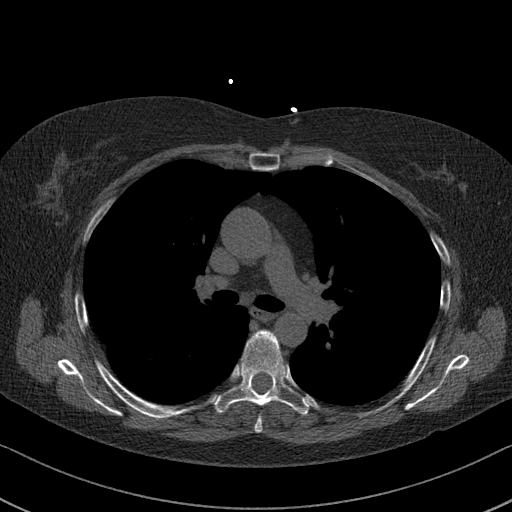
[im 63/76  lung]
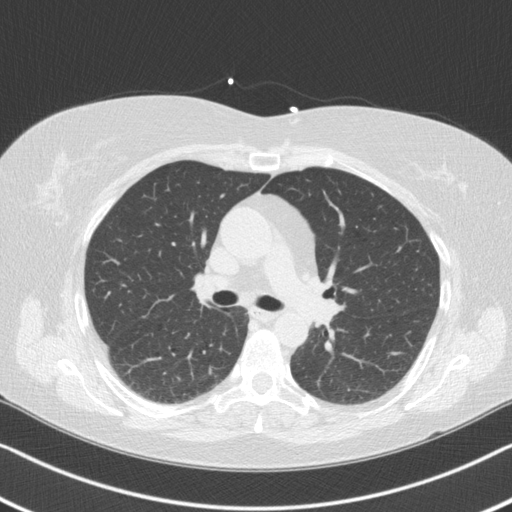

[Series 4: cascseq 2.0 br59 lung · axial · 0.69mm/px · z∈[-181,-81]mm · 5 of 76 slices shown]
[im 13/76  lung]
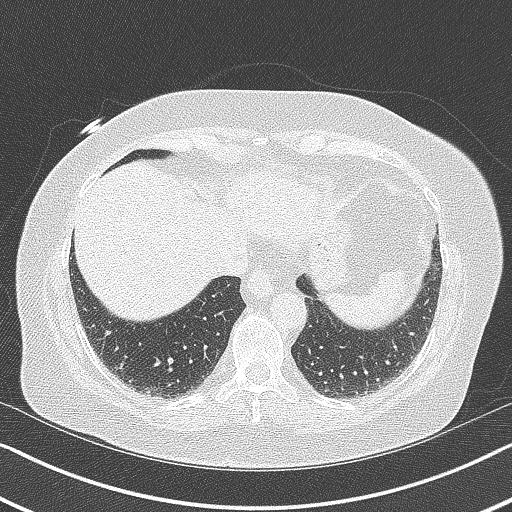
[im 26/76  lung]
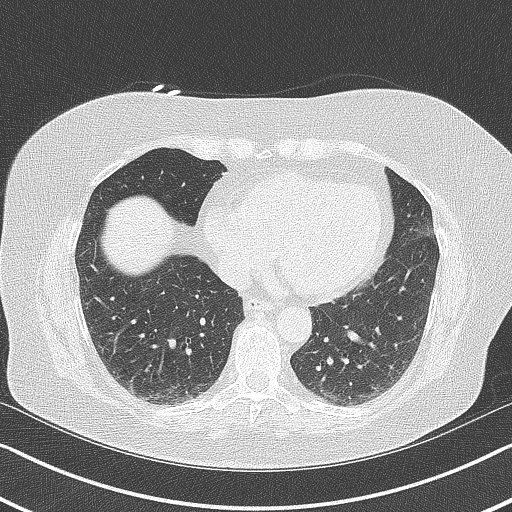
[im 38/76  lung]
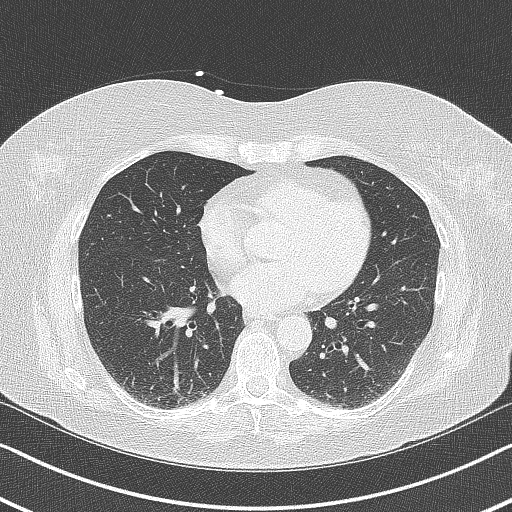
[im 51/76  lung]
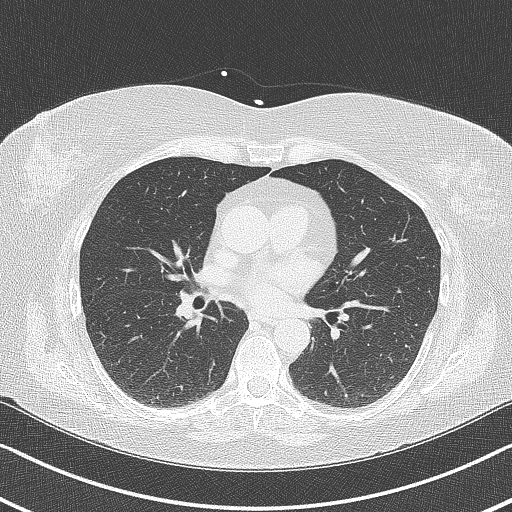
[im 63/76  lung]
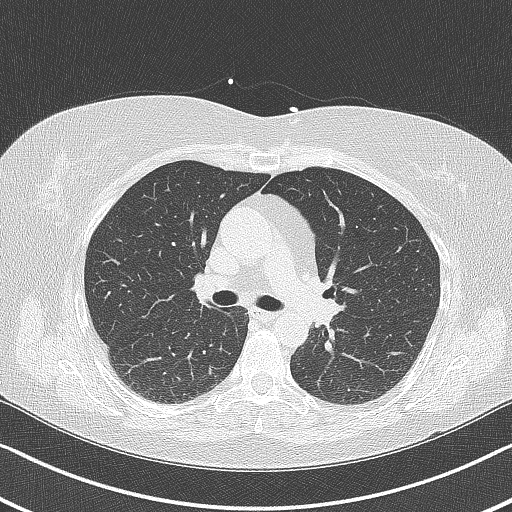

[14 of 20 positions shown; findings below may reference images not displayed]

FINDINGS: Extracardiac vascular: Aortic arch atherosclerotic calcification.

Mediastinum: Suspected small type 1 hiatal hernia.

Lungs: Centrilobular emphysema. Tiny subpleural lymph nodes in the
right lower lobe along the major fissure do not appear substantially
changed from 06/11/2009.

Upper abdomen: Unremarkable

Musculoskeletal: Mild lower thoracic spondylosis. Stable Schmorl's
node along the superior endplate of T10, no change from 05/24/2018.
IMPRESSION: 1. Aortic Atherosclerosis (2S5H9-05C.C) and Emphysema (2S5H9-ZPF.P).
2. Small type 1 hiatal hernia.
FINDINGS: Coronary arteries: Normal origins.

Coronary Calcium Score:

Left main:

Left anterior descending artery: 112

Left circumflex artery: 0

Right coronary artery: 0

Total: 124

Percentile: 75th

Pericardium: Normal.

Ascending Aorta: Normal caliber.  3.4 cm.  Aortic atherosclerosis.

Non-cardiac: See separate report from [REDACTED].
IMPRESSION: Coronary calcium score of 124. This was 75th percentile for age-,
race-, and sex-matched controls.

Aortic atherosclerosis.



If CAC=0, it is reasonable to withhold statin therapy and reassess
in 5 to 10 years, as long as higher risk conditions are absent
(diabetes mellitus, family history of premature CHD in first degree
relatives (males <55 years; females <65 years), cigarette smoking,
or LDL >=190 mg/dL).

If CAC is 1 to 99, it is reasonable to initiate statin therapy for
patients >=55 years of age.

If CAC is >=100 or >=75th percentile, it is reasonable to initiate
statin therapy at any age.

Cardiology referral should be considered for patients with CAC
scores >=400 or >=75th percentile.

*2039 AHA/ACC/AACVPR/AAPA/ABC/ANDY/VINE/ARGJENT/Maragli/CEEJAY/KAMRON/NAKAMURA
Guideline on the Management of Blood Cholesterol: A Report of the
American College of Cardiology/American Heart Association Task Force
on Clinical Practice Guidelines. J Am Coll Cardiol.
5175;73(24):8947-8532.

*** End of Addendum ***
EXAM:
OVER-READ INTERPRETATION  CT CHEST

The following report is an over-read performed by radiologist Dr.
over-read does not include interpretation of cardiac or coronary
anatomy or pathology. The coronary calcium score interpretation by
the cardiologist will be attached.
FINDINGS: Extracardiac vascular: Aortic arch atherosclerotic calcification.

Mediastinum: Suspected small type 1 hiatal hernia.

Lungs: Centrilobular emphysema. Tiny subpleural lymph nodes in the
right lower lobe along the major fissure do not appear substantially
changed from 06/11/2009.

Upper abdomen: Unremarkable

Musculoskeletal: Mild lower thoracic spondylosis. Stable Schmorl's
node along the superior endplate of T10, no change from 05/24/2018.
IMPRESSION: 1. Aortic Atherosclerosis (2S5H9-05C.C) and Emphysema (2S5H9-ZPF.P).
2. Small type 1 hiatal hernia.

## 2022-10-19 DIAGNOSIS — M5432 Sciatica, left side: Secondary | ICD-10-CM | POA: Diagnosis not present

## 2022-10-19 DIAGNOSIS — M79605 Pain in left leg: Secondary | ICD-10-CM | POA: Diagnosis not present

## 2022-10-21 DIAGNOSIS — M5432 Sciatica, left side: Secondary | ICD-10-CM | POA: Diagnosis not present

## 2022-10-21 DIAGNOSIS — M79605 Pain in left leg: Secondary | ICD-10-CM | POA: Diagnosis not present

## 2022-10-23 DIAGNOSIS — M79605 Pain in left leg: Secondary | ICD-10-CM | POA: Diagnosis not present

## 2022-10-23 DIAGNOSIS — M5432 Sciatica, left side: Secondary | ICD-10-CM | POA: Diagnosis not present

## 2022-10-25 ENCOUNTER — Other Ambulatory Visit: Payer: Self-pay | Admitting: Cardiovascular Disease

## 2022-10-28 DIAGNOSIS — M79605 Pain in left leg: Secondary | ICD-10-CM | POA: Diagnosis not present

## 2022-10-28 DIAGNOSIS — M5432 Sciatica, left side: Secondary | ICD-10-CM | POA: Diagnosis not present

## 2022-10-30 DIAGNOSIS — M5432 Sciatica, left side: Secondary | ICD-10-CM | POA: Diagnosis not present

## 2022-10-30 DIAGNOSIS — M79605 Pain in left leg: Secondary | ICD-10-CM | POA: Diagnosis not present

## 2022-11-02 DIAGNOSIS — M5432 Sciatica, left side: Secondary | ICD-10-CM | POA: Diagnosis not present

## 2022-11-02 DIAGNOSIS — J320 Chronic maxillary sinusitis: Secondary | ICD-10-CM | POA: Diagnosis not present

## 2022-11-02 DIAGNOSIS — J301 Allergic rhinitis due to pollen: Secondary | ICD-10-CM | POA: Diagnosis not present

## 2022-11-02 DIAGNOSIS — H9313 Tinnitus, bilateral: Secondary | ICD-10-CM | POA: Diagnosis not present

## 2022-11-02 DIAGNOSIS — M79605 Pain in left leg: Secondary | ICD-10-CM | POA: Diagnosis not present

## 2022-11-04 DIAGNOSIS — M5432 Sciatica, left side: Secondary | ICD-10-CM | POA: Diagnosis not present

## 2022-11-04 DIAGNOSIS — M79605 Pain in left leg: Secondary | ICD-10-CM | POA: Diagnosis not present

## 2022-11-06 DIAGNOSIS — M79605 Pain in left leg: Secondary | ICD-10-CM | POA: Diagnosis not present

## 2022-11-06 DIAGNOSIS — M5432 Sciatica, left side: Secondary | ICD-10-CM | POA: Diagnosis not present

## 2022-11-09 DIAGNOSIS — M5432 Sciatica, left side: Secondary | ICD-10-CM | POA: Diagnosis not present

## 2022-11-09 DIAGNOSIS — M79605 Pain in left leg: Secondary | ICD-10-CM | POA: Diagnosis not present

## 2022-11-13 DIAGNOSIS — M79605 Pain in left leg: Secondary | ICD-10-CM | POA: Diagnosis not present

## 2022-11-13 DIAGNOSIS — M5432 Sciatica, left side: Secondary | ICD-10-CM | POA: Diagnosis not present

## 2022-11-18 DIAGNOSIS — M5432 Sciatica, left side: Secondary | ICD-10-CM | POA: Diagnosis not present

## 2022-11-18 DIAGNOSIS — M79605 Pain in left leg: Secondary | ICD-10-CM | POA: Diagnosis not present

## 2022-11-26 DIAGNOSIS — S76312A Strain of muscle, fascia and tendon of the posterior muscle group at thigh level, left thigh, initial encounter: Secondary | ICD-10-CM | POA: Diagnosis not present

## 2022-11-26 DIAGNOSIS — S86112A Strain of other muscle(s) and tendon(s) of posterior muscle group at lower leg level, left leg, initial encounter: Secondary | ICD-10-CM | POA: Diagnosis not present

## 2022-11-30 ENCOUNTER — Other Ambulatory Visit: Payer: Self-pay | Admitting: Cardiovascular Disease

## 2022-12-01 DIAGNOSIS — H5203 Hypermetropia, bilateral: Secondary | ICD-10-CM | POA: Diagnosis not present

## 2022-12-01 DIAGNOSIS — H43811 Vitreous degeneration, right eye: Secondary | ICD-10-CM | POA: Diagnosis not present

## 2022-12-01 DIAGNOSIS — H2513 Age-related nuclear cataract, bilateral: Secondary | ICD-10-CM | POA: Diagnosis not present

## 2022-12-02 DIAGNOSIS — H9311 Tinnitus, right ear: Secondary | ICD-10-CM | POA: Diagnosis not present

## 2022-12-02 DIAGNOSIS — H9313 Tinnitus, bilateral: Secondary | ICD-10-CM | POA: Diagnosis not present

## 2022-12-02 DIAGNOSIS — H90A22 Sensorineural hearing loss, unilateral, left ear, with restricted hearing on the contralateral side: Secondary | ICD-10-CM | POA: Diagnosis not present

## 2022-12-04 ENCOUNTER — Other Ambulatory Visit: Payer: Self-pay | Admitting: Cardiovascular Disease

## 2022-12-04 NOTE — Telephone Encounter (Signed)
Prescription denied, message sent to pharmacy to contact primary   Dr Kathlene November visit - as needed follow up

## 2022-12-04 NOTE — Telephone Encounter (Signed)
Patient has rosuvastatin 20 mg, last office visit was 06/28/30. Follow up says as needed. Medication report also says the med was discontinued 10/02/22. I'm unsure how to proceed with the refill request.

## 2022-12-14 ENCOUNTER — Ambulatory Visit (INDEPENDENT_AMBULATORY_CARE_PROVIDER_SITE_OTHER): Payer: Medicare Other | Admitting: Family

## 2022-12-14 ENCOUNTER — Telehealth: Payer: Self-pay | Admitting: Family

## 2022-12-14 VITALS — BP 128/65 | HR 70 | Temp 97.9°F | Resp 18 | Ht 65.0 in | Wt 175.0 lb

## 2022-12-14 DIAGNOSIS — Z1159 Encounter for screening for other viral diseases: Secondary | ICD-10-CM

## 2022-12-14 DIAGNOSIS — E7800 Pure hypercholesterolemia, unspecified: Secondary | ICD-10-CM | POA: Diagnosis not present

## 2022-12-14 DIAGNOSIS — L659 Nonscarring hair loss, unspecified: Secondary | ICD-10-CM | POA: Diagnosis not present

## 2022-12-14 DIAGNOSIS — Z23 Encounter for immunization: Secondary | ICD-10-CM | POA: Diagnosis not present

## 2022-12-14 DIAGNOSIS — R7303 Prediabetes: Secondary | ICD-10-CM

## 2022-12-14 DIAGNOSIS — M810 Age-related osteoporosis without current pathological fracture: Secondary | ICD-10-CM

## 2022-12-14 LAB — COMPREHENSIVE METABOLIC PANEL
ALT: 17 U/L (ref 0–35)
AST: 17 U/L (ref 0–37)
Albumin: 4 g/dL (ref 3.5–5.2)
Alkaline Phosphatase: 65 U/L (ref 39–117)
BUN: 17 mg/dL (ref 6–23)
CO2: 26 meq/L (ref 19–32)
Calcium: 9.4 mg/dL (ref 8.4–10.5)
Chloride: 107 meq/L (ref 96–112)
Creatinine, Ser: 0.97 mg/dL (ref 0.40–1.20)
GFR: 58.72 mL/min — ABNORMAL LOW (ref >60.00)
Glucose, Bld: 80 mg/dL (ref 70–99)
Potassium: 4.1 meq/L (ref 3.5–5.1)
Sodium: 142 meq/L (ref 135–145)
Total Bilirubin: 0.5 mg/dL (ref 0.2–1.2)
Total Protein: 6.3 g/dL (ref 6.0–8.3)

## 2022-12-14 LAB — CBC WITH DIFFERENTIAL/PLATELET
Basophils Absolute: 0.1 10*3/uL (ref 0.0–0.1)
Basophils Relative: 0.8 % (ref 0.0–3.0)
Eosinophils Absolute: 0.1 10*3/uL (ref 0.0–0.7)
Eosinophils Relative: 1.5 % (ref 0.0–5.0)
HCT: 46.7 % — ABNORMAL HIGH (ref 36.0–46.0)
Hemoglobin: 15.3 g/dL — ABNORMAL HIGH (ref 12.0–15.0)
Lymphocytes Relative: 21.8 % (ref 12.0–46.0)
Lymphs Abs: 1.7 10*3/uL (ref 0.7–4.0)
MCHC: 32.8 g/dL (ref 30.0–36.0)
MCV: 96.4 fl (ref 78.0–100.0)
Monocytes Absolute: 0.6 10*3/uL (ref 0.1–1.0)
Monocytes Relative: 7.3 % (ref 3.0–12.0)
Neutro Abs: 5.3 10*3/uL (ref 1.4–7.7)
Neutrophils Relative %: 68.6 % (ref 43.0–77.0)
Platelets: 204 10*3/uL (ref 150.0–400.0)
RBC: 4.85 Mil/uL (ref 3.87–5.11)
RDW: 13.8 % (ref 11.5–15.5)
WBC: 7.7 10*3/uL (ref 4.0–10.5)

## 2022-12-14 LAB — LIPID PANEL
Cholesterol: 164 mg/dL (ref 0–200)
HDL: 49.4 mg/dL (ref >39.00)
LDL Cholesterol: 74 mg/dL (ref 0–99)
NonHDL: 114.15
Total CHOL/HDL Ratio: 3
Triglycerides: 203 mg/dL — ABNORMAL HIGH (ref 0.0–149.0)
VLDL: 40.6 mg/dL — ABNORMAL HIGH (ref 0.0–40.0)

## 2022-12-14 LAB — HEPATITIS C ANTIBODY: Hepatitis C Ab: NONREACTIVE

## 2022-12-14 LAB — HEMOGLOBIN A1C: Hgb A1c MFr Bld: 6.3 % (ref 4.6–6.5)

## 2022-12-14 LAB — TSH: TSH: 2.22 u[IU]/mL (ref 0.35–5.50)

## 2022-12-14 MED ORDER — MECLIZINE HCL 25 MG PO TABS: 25.0000 mg | ORAL_TABLET | Freq: Three times a day (TID) | ORAL | 1 refills | Status: DC | PRN

## 2022-12-14 MED ORDER — ROSUVASTATIN CALCIUM 20 MG PO TABS: 20.0000 mg | ORAL_TABLET | Freq: Every day | ORAL | 1 refills | Status: DC

## 2022-12-14 MED ORDER — SHINGRIX 50 MCG/0.5ML IM SUSR
INTRAMUSCULAR | 1 refills | Status: DC
Start: 2022-12-14 — End: 2023-06-10

## 2022-12-14 NOTE — Assessment & Plan Note (Signed)
Due for updated A1C.

## 2022-12-14 NOTE — Assessment & Plan Note (Signed)
Due for updated lipid panel. Will check, continue rosuvastatin.

## 2022-12-14 NOTE — Patient Instructions (Signed)
VISIT SUMMARY:  During your visit, we discussed your ongoing symptoms of tinnitus (ringing in the ears) and vertigo (a sensation of feeling off balance), which seem to be associated with nasal congestion. We also discussed your recent increase in hair loss, your history of high cholesterol, and your borderline A1C levels. We reviewed your immunization status and general health maintenance needs.  YOUR PLAN:  -VERTIGO AND TINNITUS: These are conditions that cause a sensation of spinning and ringing in the ears, respectively. We will refill your Meclizine prescription and you should continue using Nasacort and Azelastin as prescribed by your ENT specialist.  -HIGH CHOLESTEROL: This is a condition where you have too much of certain fats in your blood. We will order a lipid panel to check your cholesterol levels.  -HAIR LOSS: This is a condition where you lose more hair than normal. We will order thyroid function tests and a complete blood count to look for possible causes.  -BORDERLINE A1C: This is a measure of your average blood sugar levels over the past 3 months. We will order an A1C test to check your levels.  -GENERAL HEALTH MAINTENANCE: We administered a high-dose flu vaccine today. We recommend you get the updated shingles vaccine at your pharmacy due to Medicare coverage rules. We also recommend a Hepatitis C screening. We will obtain your mammogram report from [facility]. Please note that you are due for a colonoscopy in a year.  INSTRUCTIONS:  Please continue to take your medications as prescribed. We will contact you with the results of your blood tests. Please get the updated shingles vaccine at your pharmacy.  We will follow up in 6 months.

## 2022-12-14 NOTE — Telephone Encounter (Addendum)
Please request copy of mammogram from [place]. Also, bone density from them if available.

## 2022-12-14 NOTE — Telephone Encounter (Signed)
Electronic request made

## 2022-12-14 NOTE — Progress Notes (Signed)
Subjective:     Patient ID: Bonnie Brady, female    DOB: 03/26/1951, 72 y.o.   MRN: [id number]  Chief Complaint  Patient presents with   Hyperlipidemia    Here for follow up   Dizziness    Complains of dizziness    Hyperlipidemia  Dizziness    Discussed the use of AI scribe software for clinical note transcription with the patient, who gave verbal consent to proceed.  History of Present Illness   The patient, with a history of tinnitus and vertigo, reports ongoing symptoms despite treatment with Nasacort and Azelastine nasal sprays. She notes that vertigo episodes are typically associated with nasal congestion. Meclizine has been effective in controlling vertigo symptoms, and the patient requests a refill. She denies any sedating effects from the medication. She is being followed by ENT for these issues but is requesting a refill of meclizine today to have on hand for prn use.   The patient also reports increased hair loss, which is a new symptom. She has a history of high cholesterol and is currently on 20mg  of rosuvastatin. She has not had a full blood panel or cholesterol check in some time and requests these tests. She also requests a check of her A1C, as it has been borderline in the past.  The patient has a history of adverse reactions to the tetanus vaccine and declines to receive it again. She received the older version of the shingles vaccine in 2013 and is due for the updated version, which she will receive at her pharmacy. She consents to a Hepatitis C screen, which has not been done before. She is also due for a flu shot, which she will receive during this visit.     Lab Results  Component Value Date   CHOL 142 12/08/2021   HDL 39 (L) 12/08/2021   LDLCALC 66 12/08/2021   LDLDIRECT 93.0 02/07/2019   TRIG 229 (H) 12/08/2021   CHOLHDL 3.6 12/08/2021    Lab Results  Component Value Date   HGBA1C 6.2 01/02/2022    Health Maintenance Due  Topic Date Due    Hepatitis C Screening  Never done   Zoster Vaccines- Shingrix (1 of 2) 04/14/2001   MAMMOGRAM  02/19/2022   COVID-19 Vaccine (1 - 2023-24 season) Never done   Medicare Annual Wellness (AWV)  01/03/2023    Past Medical History:  Diagnosis Date   Borderline diabetes    COVID-19 10/2018   Hyperlipidemia    Kidney stone    Osteopenia    Tachycardia    normal cardiology workup, no palpitations in 5-6 yrs   Varicose veins     Past Surgical History:  Procedure Laterality Date   ABDOMINAL HYSTERECTOMY     APPENDECTOMY  1971   bladder strap  2006   CESAREAN SECTION  1978 and 1979   laser of veins  2010   also in 2016 and 2017   MASS EXCISION Right 03/13/2020   Procedure: EXCISION OF RIGHT HIP MASS;  Surgeon: Emelia Loron, MD;  Location: [facility];  Service: General;  Laterality: Right;   TONSILLECTOMY  1958    Family History  Problem Relation Age of Onset   Stroke Mother    COPD Father    COPD Brother    Stroke Maternal Grandfather    Breast cancer Paternal Grandmother    Breast cancer Daughter     Social History   Socioeconomic History   Marital status: Married  Spouse name: Not on file   Number of children: Not on file   Years of education: Not on file   Highest education level: Associate degree: occupational, technical, or vocational program  Occupational History   Not on file  Tobacco Use   Smoking status: Former    Current packs/day: 0.00    Types: Cigarettes    Quit date: 03/30/2008    Years since quitting: 14.7   Smokeless tobacco: Never  Vaping Use   Vaping status: Never Used  Substance and Sexual Activity   Alcohol use: Yes    Comment: occasional    Drug use: No   Sexual activity: Not on file  Other Topics Concern   Not on file  Social History Narrative   Has worked as Engineer, site and sonographer- stopped working 2011.    Married   2 children   Enjoys Insurance risk surveyor      Social Determinants of Health    Financial Resource Strain: Low Risk  (08/04/2022)   Overall Financial Resource Strain (CARDIA)    Difficulty of Paying Living Expenses: Not hard at all  Food Insecurity: No Food Insecurity (08/04/2022)   Hunger Vital Sign    Worried About Running Out of Food in the Last Year: Never true    Ran Out of Food in the Last Year: Never true  Transportation Needs: No Transportation Needs (08/04/2022)   PRAPARE - Administrator, Civil Service (Medical): No    Lack of Transportation (Non-Medical): No  Physical Activity: Sufficiently Active (08/04/2022)   Exercise Vital Sign    Days of Exercise per Week: 3 days    Minutes of Exercise per Session: 60 min  Stress: No Stress Concern Present (08/04/2022)   [employer] of Occupational Health - Occupational Stress Questionnaire    Feeling of Stress : Not at all  Social Connections: Socially Integrated (08/04/2022)   Social Connection and Isolation Panel [NHANES]    Frequency of Communication with Friends and Family: More than three times a week    Frequency of Social Gatherings with Friends and Family: More than three times a week    Attends Religious Services: More than 4 times per year    Active Member of [employer] or Organizations: Yes    Attends Engineer, structural: More than 4 times per year    Marital Status: Married  Catering manager Violence: Not At Risk (01/02/2022)   Humiliation, Afraid, Rape, and Kick questionnaire    Fear of Current or Ex-Partner: No    Emotionally Abused: No    Physically Abused: No    Sexually Abused: No    Outpatient Medications Prior to Visit  Medication Sig Dispense Refill   aspirin EC 81 MG tablet Take 81 mg by mouth daily. Swallow whole.     Nutritional Supplements (JUICE PLUS FIBRE PO) Take 4 tablets by mouth in the morning and at bedtime.     Omega-3 Fatty Acids (OMEGA 3 PO) Take 1 capsule by mouth in the morning and at bedtime.     rosuvastatin (CRESTOR) 20 MG tablet TAKE 1 TABLET BY MOUTH  EVERY DAY 30 tablet 1   tobramycin-dexamethasone (TOBRADEX) ophthalmic solution Place 2 drops into the right eye every 6 (six) hours. 5 mL 0   No facility-administered medications prior to visit.    Allergies  Allergen Reactions   Latex Rash    Contact with powder.   Tetanus Toxoids Other (See Comments)    Swelling / redness  Nickel Rash    Blisters    Review of Systems  Neurological:  Positive for dizziness.       Objective:    Physical Exam Constitutional:      General: She is not in acute distress.    Appearance: Normal appearance. She is well-developed.  HENT:     Head: Normocephalic and atraumatic.     Right Ear: External ear normal.     Left Ear: External ear normal.  Eyes:     General: No scleral icterus. Neck:     Thyroid: No thyromegaly.  Cardiovascular:     Rate and Rhythm: Normal rate and regular rhythm.     Heart sounds: Normal heart sounds. No murmur heard. Pulmonary:     Effort: Pulmonary effort is normal. No respiratory distress.     Breath sounds: Normal breath sounds. No wheezing.  Musculoskeletal:     Cervical back: Neck supple.  Skin:    General: Skin is warm and dry.  Neurological:     Mental Status: She is alert and oriented to person, place, and time.  Psychiatric:        Mood and Affect: Mood normal.        Behavior: Behavior normal.        Thought Content: Thought content normal.        Judgment: Judgment normal.      BP 128/65 (BP Location: Right Arm, Patient Position: Sitting, Cuff Size: Large)   Pulse 70   Temp 97.9 F (36.6 C) (Oral)   Resp 18   Ht 5\' 5"  (1.651 m)   Wt 175 lb (79.4 kg)   SpO2 97%   BMI 29.12 kg/m  Wt Readings from Last 3 Encounters:  12/14/22 175 lb (79.4 kg)  09/12/22 169 lb 12.1 oz (77 kg)  08/28/22 175 lb (79.4 kg)       Assessment & Plan:   Problem List Items Addressed This Visit       Unprioritized   Osteoporosis    Will request copy of bone density from [place].       Hyperlipidemia     Due for updated lipid panel. Will check, continue rosuvastatin.       Relevant Medications   rosuvastatin (CRESTOR) 20 MG tablet   Other Relevant Orders   Lipid panel   Comp Met (CMET)   Hair loss - Primary    Generalized thinning. Check labs as ordered.      Relevant Orders   CBC w/Diff   TSH   Borderline diabetes    Due for updated A1C.       Relevant Orders   HgB A1c   Other Visit Diagnoses     Encounter for hepatitis C screening test for low risk patient       Relevant Orders   Hepatitis C Antibody   Need for shingles vaccine       Relevant Medications   Zoster Vaccine Adjuvanted [facility]) injection   Needs flu shot       Relevant Orders   Flu Vaccine Trivalent High Dose (Fluad) (Completed)       I have discontinued Berton Lan tobramycin-dexamethasone. I have also changed her rosuvastatin. Additionally, I am having her start on meclizine and Shingrix. Lastly, I am having her maintain her Nutritional Supplements (JUICE PLUS FIBRE PO), Omega-3 Fatty Acids (OMEGA 3 PO), and aspirin EC.  Meds ordered this encounter  Medications   rosuvastatin (CRESTOR) 20 MG tablet    Sig: Take  1 tablet (20 mg total) by mouth daily.    Dispense:  90 tablet    Refill:  1   meclizine (ANTIVERT) 25 MG tablet    Sig: Take 1 tablet (25 mg total) by mouth 3 (three) times daily as needed for dizziness.    Dispense:  30 tablet    Refill:  1    Order Specific Question:   Supervising Provider    Answer:   Danise Edge A [[id number]]   Zoster Vaccine Adjuvanted [facility]) injection    Sig: 0.5mg  IM now, then repeat in 2-6 months.    Dispense:  0.5 mL    Refill:  1    Order Specific Question:   Supervising Provider    Answer:   Danise Edge A [[id number]]

## 2022-12-14 NOTE — Assessment & Plan Note (Signed)
Refill provided for prn meclizine. She will continue to follow up with ENT for tinnitus and vertigo.

## 2022-12-14 NOTE — Assessment & Plan Note (Signed)
Generalized thinning. Check labs as ordered.

## 2022-12-14 NOTE — Assessment & Plan Note (Signed)
Will request copy of bone density from [place].

## 2022-12-18 ENCOUNTER — Other Ambulatory Visit: Payer: Self-pay | Admitting: Otolaryngology

## 2022-12-18 DIAGNOSIS — H9311 Tinnitus, right ear: Secondary | ICD-10-CM | POA: Diagnosis not present

## 2022-12-18 DIAGNOSIS — H90A22 Sensorineural hearing loss, unilateral, left ear, with restricted hearing on the contralateral side: Secondary | ICD-10-CM | POA: Diagnosis not present

## 2022-12-24 ENCOUNTER — Encounter: Payer: Self-pay | Admitting: Family

## 2023-01-07 ENCOUNTER — Other Ambulatory Visit: Payer: Medicare Other

## 2023-01-08 ENCOUNTER — Ambulatory Visit
Admission: RE | Admit: 2023-01-08 | Discharge: 2023-01-08 | Disposition: A | Discharge status: Home / Self Care | Payer: Medicare Other | Source: Ambulatory Visit | Attending: Otolaryngology | Admitting: Otolaryngology

## 2023-01-08 DIAGNOSIS — I6782 Cerebral ischemia: Secondary | ICD-10-CM | POA: Diagnosis not present

## 2023-01-08 DIAGNOSIS — H9311 Tinnitus, right ear: Secondary | ICD-10-CM | POA: Diagnosis not present

## 2023-01-08 MED ORDER — GADOPICLENOL 0.5 MMOL/ML IV SOLN
7.5000 mL | Freq: Once | INTRAVENOUS | Status: AC | PRN
  Administered 2023-01-08: 7.5 mL via INTRAVENOUS

## 2023-01-11 ENCOUNTER — Telehealth: Payer: Self-pay | Admitting: Family

## 2023-01-11 MED ORDER — MECLIZINE HCL 25 MG PO TABS: 25.0000 mg | ORAL_TABLET | Freq: Three times a day (TID) | ORAL | 1 refills | Status: DC | PRN

## 2023-01-11 NOTE — Telephone Encounter (Signed)
Pt states she is going out of town for 3 weeks and is wondering if pcp can refill meclizine (ANTIVERT) 25 MG tablet in case she needs it. Please advise.   Pharmacy [facility] [phone], [place] - [address], [place] [address] Phone: [phone]  Fax: [phone]

## 2023-01-11 NOTE — Telephone Encounter (Signed)
Last prescription- 9/16. Patient did pick up both prescription refills and is almost out. Will be out of town [facility]) starting tomorrow 10/15

## 2023-03-31 DIAGNOSIS — H9319 Tinnitus, unspecified ear: Secondary | ICD-10-CM

## 2023-03-31 DIAGNOSIS — H832X2 Labyrinthine dysfunction, left ear: Secondary | ICD-10-CM

## 2023-03-31 DIAGNOSIS — H9192 Unspecified hearing loss, left ear: Secondary | ICD-10-CM

## 2023-03-31 HISTORY — DX: Unspecified hearing loss, left ear: H91.92

## 2023-03-31 HISTORY — DX: Labyrinthine dysfunction, left ear: H83.2X2

## 2023-03-31 HISTORY — DX: Tinnitus, unspecified ear: H93.19

## 2023-05-06 DIAGNOSIS — Z1231 Encounter for screening mammogram for malignant neoplasm of breast: Secondary | ICD-10-CM | POA: Diagnosis not present

## 2023-05-06 LAB — HM MAMMOGRAPHY

## 2023-05-26 DIAGNOSIS — L570 Actinic keratosis: Secondary | ICD-10-CM | POA: Diagnosis not present

## 2023-05-26 DIAGNOSIS — D239 Other benign neoplasm of skin, unspecified: Secondary | ICD-10-CM | POA: Diagnosis not present

## 2023-06-08 ENCOUNTER — Telehealth: Payer: Self-pay | Admitting: Neurology

## 2023-06-08 NOTE — Telephone Encounter (Signed)
 Pt is asking for a call to discuss symptoms : blurry vision in right eye, fuzzy vision, light headed, off balance, eye twitching.  Pt has not been to ED or pcp, does not want to go to ED.  Will possibly reach out to her PCP.  Pt was encouraged to go to ED.  She is asking for a call.

## 2023-06-08 NOTE — Telephone Encounter (Signed)
 Message was relayed to pt from [place], RN she will see her primary Dr since this is not something that occurs everyday.

## 2023-06-08 NOTE — Telephone Encounter (Signed)
 Called the pt back. There was no answer. LVM asking the patient to call back.   **If the pt calls back I am happy to discuss but if these symptoms that she advised and listed are new for the pt then she is best to be evaluated by urgent care/ER. Especially if there was no problems with her vision before and balance. She needs to be examined and the MD is not in the office this week.

## 2023-06-10 ENCOUNTER — Ambulatory Visit (INDEPENDENT_AMBULATORY_CARE_PROVIDER_SITE_OTHER): Admitting: Family Medicine

## 2023-06-10 VITALS — BP 132/67 | HR 77 | Ht 65.0 in | Wt 171.0 lb

## 2023-06-10 DIAGNOSIS — H538 Other visual disturbances: Secondary | ICD-10-CM

## 2023-06-10 DIAGNOSIS — R7303 Prediabetes: Secondary | ICD-10-CM | POA: Diagnosis not present

## 2023-06-10 DIAGNOSIS — Z8669 Personal history of other diseases of the nervous system and sense organs: Secondary | ICD-10-CM

## 2023-06-10 DIAGNOSIS — R944 Abnormal results of kidney function studies: Secondary | ICD-10-CM

## 2023-06-10 NOTE — Progress Notes (Signed)
 Acute Office Visit  Subjective:     Patient ID: Bonnie Brady, female    DOB: [birth year], 73 y.o.   MRN: [id number]  Chief Complaint  Patient presents with   Blurred Vision   Headache    Patient is in today for vision changes. She is with her husband.   Discussed the use of AI scribe software for clinical note transcription with the patient, who gave verbal consent to proceed.  History of Present Illness The patient presents with episodes of blurry vision.   She has been experiencing episodes of blurry vision since mid-February, initially affecting only the left eye. These episodes were temporary, lasting about 20 minutes, and occurred three times in one week before resolving. Recently, the blurry vision has recurred, now affecting both eyes, with episodes last week and again last night, each lasting about 20 minutes. She describes the vision as 'like water on your glasses' and notes that it is not associated with headaches, nausea, vomiting, or significant dizziness, although she does feel slightly lightheaded. There is no associated pain, pressure, or sinus symptoms.  She has a history of cyclic migraines in her youth, which have become less frequent after having children. Sunlight can trigger migraines, but she has not experienced any migraines recently, except for one mild episode following the initial left eye blurriness in February. This episode was not accompanied by the same type of headache she used to experience with migraines.  She has a history of numbness in her scalp for which she consulted a neurologist in November. An MRI of the brain at that time showed no acute abnormalities or masses, though some chronic changes were noted. She reports having trouble getting in with her neurologist for current symptoms and seeks a new referral.  She spends a lot of time on the computer doing family research, but has not noticed any patterns/trends with her vision episodes. Denies any  asymmetrical weakness, facial droop, slurred speech, confusion, etc.           All review of systems negative except what is listed in the HPI      Objective:    BP 132/67   Pulse 77   Ht 5\' 5"  (1.651 m)   Wt 171 lb (77.6 kg)   SpO2 98%   BMI 28.46 kg/m    Physical Exam Vitals reviewed.  Constitutional:      Appearance: She is well-developed.  HENT:     Head: Normocephalic and atraumatic.  Eyes:     General: No visual field deficit or scleral icterus.    Extraocular Movements: Extraocular movements intact.     Right eye: Normal extraocular motion and no nystagmus.     Left eye: Normal extraocular motion and no nystagmus.     Pupils: Pupils are equal, round, and reactive to light. Pupils are equal.     Right eye: Pupil is round and reactive.     Left eye: Pupil is round and reactive.  Cardiovascular:     Rate and Rhythm: Normal rate and regular rhythm.     Heart sounds: Normal heart sounds.  Pulmonary:     Effort: Pulmonary effort is normal.     Breath sounds: Normal breath sounds.  Musculoskeletal:        General: Normal range of motion.     Cervical back: Normal range of motion and neck supple.  Skin:    General: Skin is warm and dry.  Neurological:     Mental Status: She  is alert.     Cranial Nerves: No cranial nerve deficit, dysarthria or facial asymmetry.     Sensory: No sensory deficit.     Motor: No weakness.     Coordination: Coordination normal.     Gait: Gait normal.  Psychiatric:        Mood and Affect: Mood normal.        Speech: Speech normal.        Behavior: Behavior normal.       No results found for any visits on 06/10/23.      Assessment & Plan:   Problem List Items Addressed This Visit       Active Problems   Borderline diabetes   Relevant Orders   Hemoglobin A1c   Other Visit Diagnoses       Blurred vision, bilateral    -  Primary   Relevant Orders   CBC with Differential/Platelet   Comprehensive metabolic panel    Hemoglobin A1c   Ambulatory referral to Ophthalmology   Ambulatory referral to Neurology     History of migraine       Relevant Orders   Ambulatory referral to Neurology         Assessment & Plan Blurry vision Intermittent bilateral blurry vision, atypical for her migraine history. Stable MRI last year. Thorough neuro exam today was unremarkable. No current vision changes at this time.  - Order blood work including A1c to rule out diabetes-related vision changes. - Refer to ophthalmologist for comprehensive eye examination. - Refer to a different neurologist for second opinion. - Advise to seek emergency care if stroke symptoms occur. - Suggest seeing regular optometrist for initial evaluation if ophthalmologist appointment is delayed.        No orders of the defined types were placed in this encounter.   Return if symptoms worsen or fail to improve.  Clayborne Dana, NP

## 2023-06-11 ENCOUNTER — Encounter: Payer: Self-pay | Admitting: Family Medicine

## 2023-06-11 LAB — COMPREHENSIVE METABOLIC PANEL
ALT: 23 U/L (ref 0–35)
AST: 22 U/L (ref 0–37)
Albumin: 4.6 g/dL (ref 3.5–5.2)
Alkaline Phosphatase: 77 U/L (ref 39–117)
BUN: 24 mg/dL — ABNORMAL HIGH (ref 6–23)
CO2: 25 meq/L (ref 19–32)
Calcium: 9.9 mg/dL (ref 8.4–10.5)
Chloride: 105 meq/L (ref 96–112)
Creatinine, Ser: 1.15 mg/dL (ref 0.40–1.20)
GFR: 47.71 mL/min — ABNORMAL LOW (ref >60.00)
Glucose, Bld: 128 mg/dL — ABNORMAL HIGH (ref 70–99)
Potassium: 4.1 meq/L (ref 3.5–5.1)
Sodium: 142 meq/L (ref 135–145)
Total Bilirubin: 0.5 mg/dL (ref 0.2–1.2)
Total Protein: 6.8 g/dL (ref 6.0–8.3)

## 2023-06-11 LAB — CBC WITH DIFFERENTIAL/PLATELET
Basophils Absolute: 0.1 10*3/uL (ref 0.0–0.1)
Basophils Relative: 0.8 % (ref 0.0–3.0)
Eosinophils Absolute: 0.1 10*3/uL (ref 0.0–0.7)
Eosinophils Relative: 0.8 % (ref 0.0–5.0)
HCT: 45.5 % (ref 36.0–46.0)
Hemoglobin: 15.1 g/dL — ABNORMAL HIGH (ref 12.0–15.0)
Lymphocytes Relative: 23.6 % (ref 12.0–46.0)
Lymphs Abs: 1.8 10*3/uL (ref 0.7–4.0)
MCHC: 33.1 g/dL (ref 30.0–36.0)
MCV: 96.1 fl (ref 78.0–100.0)
Monocytes Absolute: 0.4 10*3/uL (ref 0.1–1.0)
Monocytes Relative: 5.8 % (ref 3.0–12.0)
Neutro Abs: 5.3 10*3/uL (ref 1.4–7.7)
Neutrophils Relative %: 69 % (ref 43.0–77.0)
Platelets: 237 10*3/uL (ref 150.0–400.0)
RBC: 4.74 Mil/uL (ref 3.87–5.11)
RDW: 13.6 % (ref 11.5–15.5)
WBC: 7.7 10*3/uL (ref 4.0–10.5)

## 2023-06-11 LAB — HEMOGLOBIN A1C: Hgb A1c MFr Bld: 6.4 % (ref 4.6–6.5)

## 2023-06-11 NOTE — Addendum Note (Signed)
 Addended by: Hyman Hopes B on: 06/11/2023 01:00 PM   Modules accepted: Orders

## 2023-06-12 ENCOUNTER — Other Ambulatory Visit: Payer: Self-pay | Admitting: Family

## 2023-06-16 DIAGNOSIS — Z79899 Other long term (current) drug therapy: Secondary | ICD-10-CM | POA: Diagnosis not present

## 2023-06-16 DIAGNOSIS — L650 Telogen effluvium: Secondary | ICD-10-CM | POA: Diagnosis not present

## 2023-06-17 DIAGNOSIS — H43811 Vitreous degeneration, right eye: Secondary | ICD-10-CM | POA: Diagnosis not present

## 2023-06-17 DIAGNOSIS — L650 Telogen effluvium: Secondary | ICD-10-CM | POA: Diagnosis not present

## 2023-06-17 DIAGNOSIS — H2513 Age-related nuclear cataract, bilateral: Secondary | ICD-10-CM | POA: Diagnosis not present

## 2023-06-17 DIAGNOSIS — H5203 Hypermetropia, bilateral: Secondary | ICD-10-CM | POA: Diagnosis not present

## 2023-06-17 DIAGNOSIS — Z79899 Other long term (current) drug therapy: Secondary | ICD-10-CM | POA: Diagnosis not present

## 2023-06-23 ENCOUNTER — Encounter: Payer: Self-pay | Admitting: Neurology

## 2023-07-07 ENCOUNTER — Ambulatory Visit (INDEPENDENT_AMBULATORY_CARE_PROVIDER_SITE_OTHER): Admitting: Family

## 2023-07-07 VITALS — BP 117/69 | HR 62 | Temp 97.5°F | Resp 16 | Wt 173.0 lb

## 2023-07-07 DIAGNOSIS — R220 Localized swelling, mass and lump, head: Secondary | ICD-10-CM | POA: Diagnosis not present

## 2023-07-07 MED ORDER — CEPHALEXIN 500 MG PO CAPS: 500.0000 mg | ORAL_CAPSULE | Freq: Three times a day (TID) | ORAL | 0 refills | Status: DC

## 2023-07-07 NOTE — Patient Instructions (Signed)
 VISIT SUMMARY:  Today, you were seen for a nasal bone prominence and fluid accumulation that you noticed about three weeks ago. You also have a history of vertigo and tinnitus, with a slight hearing loss in your left ear and persistent ringing in both ears.  YOUR PLAN:  -NASAL BONE PROMINENCE: You have a noticeable bump on your nasal bone that has been getting larger, but it is not painful or tender. To address a possible infection, you have been prescribed Keflex to take three times a day for one week. If the bump does not improve, we may refer you to an ear, nose, and throat specialist.  -TINNITUS: Tinnitus is a condition where you hear ringing or other noises in your ears. You have persistent ringing in both ears along with vertigo and slight hearing loss in your left ear. You should continue with your scheduled neurology appointment next month to further evaluate this condition.  INSTRUCTIONS:  Please take Keflex as prescribed, three times a day for one week. Monitor the nasal bone prominence, and if there is no improvement, we will consider referring you to an ear, nose, and throat specialist. Also, keep your neurology appointment next month to discuss your tinnitus and vertigo.

## 2023-07-07 NOTE — Progress Notes (Signed)
 Subjective:     Patient ID: Bonnie Brady, female    DOB: 1950-07-25, 73 y.o.   MRN: [id number]  Chief Complaint  Patient presents with   Facial Swelling    Patient reports swelling on right side of nose    HPI  Discussed the use of AI scribe software for clinical note transcription with the patient, who gave verbal consent to proceed.  History of Present Illness  Bonnie Brady is a 73 year old female who presents with a nasal bone prominence and fluid accumulation.  Approximately three weeks ago, she noticed a prominence on her nasal bone, which has been progressively enlarging. She describes a sensation of fluid accumulation under the skin, particularly noticeable yesterday. There is no pain or tenderness associated with the nasal prominence. No unusual nasal drainage beyond her usual sinus issues, and she has not experienced any fever or nasal sores. There is no history of nasal injury.  She has a history of vertigo and tinnitus, with a slight hearing loss in the left ear and persistent ringing in both ears. A complete scan and MRI last year performed by ENT were reportedly unremarkable. She is scheduled to see a neurologist next month regarding the tinnitus.     Health Maintenance Due  Topic Date Due   COVID-19 Vaccine (1) Never done   Zoster Vaccines- Shingrix (1 of 2) 04/14/1970   Medicare Annual Wellness (AWV)  01/03/2023    Past Medical History:  Diagnosis Date   Borderline diabetes    COVID-19 10/2018   Hyperlipidemia    Kidney stone    Osteopenia    Tachycardia    normal cardiology workup, no palpitations in 5-6 yrs   Varicose veins     Past Surgical History:  Procedure Laterality Date   ABDOMINAL HYSTERECTOMY     APPENDECTOMY  1971   bladder strap  2006   CESAREAN SECTION  1978 and 1979   laser of veins  2010   also in 2016 and 2017   MASS EXCISION Right 03/13/2020   Procedure: EXCISION OF RIGHT HIP MASS;  Surgeon: Emelia Loron, MD;   Location: [facility];  Service: General;  Laterality: Right;   TONSILLECTOMY  1958    Family History  Problem Relation Age of Onset   Stroke Mother    COPD Father    COPD Brother    Stroke Maternal Grandfather    Breast cancer Paternal Grandmother    Breast cancer Daughter     Social History   Socioeconomic History   Marital status: Married    Spouse name: Not on file   Number of children: Not on file   Years of education: Not on file   Highest education level: Associate degree: occupational, Scientist, product/process development, or vocational program  Occupational History   Not on file  Tobacco Use   Smoking status: Former    Current packs/day: 0.00    Types: Cigarettes    Quit date: 03/30/2008    Years since quitting: 15.2   Smokeless tobacco: Never  Vaping Use   Vaping status: Never Used  Substance and Sexual Activity   Alcohol use: Yes    Comment: occasional    Drug use: No   Sexual activity: Not on file  Other Topics Concern   Not on file  Social History Narrative   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    Married   2 children   Enjoys Insurance risk surveyor  Social Drivers of Corporate investment banker Strain: Low Risk  (06/10/2023)   Overall Financial Resource Strain (CARDIA)    Difficulty of Paying Living Expenses: Not hard at all  Food Insecurity: No Food Insecurity (06/10/2023)   Hunger Vital Sign    Worried About Running Out of Food in the Last Year: Never true    Ran Out of Food in the Last Year: Never true  Transportation Needs: No Transportation Needs (06/10/2023)   PRAPARE - Administrator, Civil Service (Medical): No    Lack of Transportation (Non-Medical): No  Physical Activity: Insufficiently Active (06/10/2023)   Exercise Vital Sign    Days of Exercise per Week: 2 days    Minutes of Exercise per Session: 20 min  Stress: No Stress Concern Present (06/10/2023)   [employer] of Occupational  Health - Occupational Stress Questionnaire    Feeling of Stress : Only a little  Social Connections: Socially Integrated (06/10/2023)   Social Connection and Isolation Panel [NHANES]    Frequency of Communication with Friends and Family: More than three times a week    Frequency of Social Gatherings with Friends and Family: Twice a week    Attends Religious Services: More than 4 times per year    Active Member of [employer] or Organizations: Yes    Attends Engineer, structural: More than 4 times per year    Marital Status: Married  Catering manager Violence: Not At Risk (01/02/2022)   Humiliation, Afraid, Rape, and Kick questionnaire    Fear of Current or Ex-Partner: No    Emotionally Abused: No    Physically Abused: No    Sexually Abused: No    Outpatient Medications Prior to Visit  Medication Sig Dispense Refill   aspirin EC 81 MG tablet Take 81 mg by mouth daily. Swallow whole.     Nutritional Supplements (JUICE PLUS FIBRE PO) Take 4 tablets by mouth in the morning and at bedtime.     Omega-3 Fatty Acids (OMEGA 3 PO) Take 1 capsule by mouth in the morning and at bedtime.     rosuvastatin (CRESTOR) 20 MG tablet TAKE 1 TABLET BY MOUTH EVERY DAY 90 tablet 1   No facility-administered medications prior to visit.    Allergies  Allergen Reactions   Latex Rash    Contact with powder.   Tetanus Toxoids Other (See Comments)    Swelling / redness   Nickel Rash    Blisters    ROS    See HPI Objective:    Physical Exam Constitutional:      Appearance: Normal appearance.  HENT:     Head:     Comments: Mild swelling noted overlying the right lateral nasal bridge, no erythema, no tenderness.     Nose: No nasal deformity or septal deviation.     Right Nostril: No foreign body, epistaxis or occlusion.     Left Nostril: No foreign body.     Right Turbinates: Not enlarged.     Left Turbinates: Not enlarged.     Right Sinus: No maxillary sinus tenderness or frontal sinus  tenderness.     Left Sinus: No maxillary sinus tenderness or frontal sinus tenderness.     Comments: Prominent lateral nasal bones bilaterally Cardiovascular:     Rate and Rhythm: Normal rate.  Pulmonary:     Effort: Pulmonary effort is normal.  Neurological:     Mental Status: She is alert.      BP 117/69 (BP  Location: Right Arm, Patient Position: Sitting, Cuff Size: Normal)   Pulse 62   Temp (!) 97.5 F (36.4 C) (Oral)   Resp 16   Wt 173 lb (78.5 kg)   SpO2 100%   BMI 28.79 kg/m  Wt Readings from Last 3 Encounters:  07/07/23 173 lb (78.5 kg)  06/10/23 171 lb (77.6 kg)  12/14/22 175 lb (79.4 kg)       Assessment & Plan:   Problem List Items Addressed This Visit       Unprioritized   Nasal swelling - Primary   New.  Mild.  Will rx with empiric keflex. If symptoms worsen or do not improve, she is to let me know and I will refer her to ENT.       Relevant Medications   cephALEXin (KEFLEX) 500 MG capsule    I am having Bonnie Brady start on cephALEXin. I am also having her maintain her Nutritional Supplements (JUICE PLUS FIBRE PO), Omega-3 Fatty Acids (OMEGA 3 PO), aspirin EC, and rosuvastatin.  Meds ordered this encounter  Medications   cephALEXin (KEFLEX) 500 MG capsule    Sig: Take 1 capsule (500 mg total) by mouth 3 (three) times daily.    Dispense:  21 capsule    Refill:  0    Supervising Provider:   Danise Edge A [[id number]]

## 2023-07-07 NOTE — Assessment & Plan Note (Signed)
 New.  Mild.  Will rx with empiric keflex. If symptoms worsen or do not improve, she is to let me know and I will refer her to ENT.

## 2023-08-05 ENCOUNTER — Ambulatory Visit: Payer: Self-pay

## 2023-08-05 ENCOUNTER — Telehealth: Payer: Self-pay | Admitting: Family

## 2023-08-05 NOTE — Telephone Encounter (Addendum)
  Chief Complaint: protrusion on nose, pt states that pcp has already evaluated it.  Frequency: ongoing for about a month Pertinent Negatives: Patient denies fever, bleeding, redness, swelling, injury Disposition: [] ED /[] Urgent Care (no appt availability in office) / [x] Appointment(In office/virtual)/ []  [facility]/ [] Home Care/ [x] Refused Recommended Disposition /[] [facility]/ []  Follow-up with PCP Additional Notes: Pt states that PCP evaluated her nose last month, was given a treatment of keflex . Pt states that it has not improved nor has it gotten worse. Pt states that she is still unsure what it is. Denies respiratory implications. Pt would like to have her PCP re-eval her nose when she is in the clinic with her husband for her husbands appt. Pt advised that an appt would be best if she wants pcp to look a the area. Offered appt tomorrow at 0920, pt refused.  1. MECHANISM: "How did the injury happen?"      Denies injury 2. ONSET: "When did the injury happen?" (Minutes or hours ago)      About a month 4. APPEARANCE of INJURY: "What does the nose look like?"      Looks like a bone is protruding out the skin 5. BLEEDING: "Is the nose still bleeding?" If Yes, ask: "Is it difficult to stop?"      denies 7. PAIN: "Is it painful?" If Yes, ask: "How bad is the pain?"   (Scale 1-10; or mild, moderate, severe)     0 9. OTHER SYMPTOMS: "Do you have any other symptoms?" (e.g., headache, neck pain, loss of consciousness)     denies

## 2023-08-05 NOTE — Telephone Encounter (Signed)
 Patient was offered an appointment for tomorrow at 9:20 with provider either virtual or in person but she declined. She reports no improvement.  She will be here with her husband tomorrow for his appointment

## 2023-08-05 NOTE — Telephone Encounter (Signed)
 Reason for Disposition . Nose swelling, bruise or pain  Answer Assessment - Initial Assessment Questions 1. MECHANISM: "How did the injury happen?"      Denies injury 2. ONSET: "When did the injury happen?" (Minutes or hours ago)      About a month 4. APPEARANCE of INJURY: "What does the nose look like?"      Looks like a bone is protruding out the skin 5. BLEEDING: "Is the nose still bleeding?" If Yes, ask: "Is it difficult to stop?"      denies 7. PAIN: "Is it painful?" If Yes, ask: "How bad is the pain?"   (Scale 1-10; or mild, moderate, severe)     0 9. OTHER SYMPTOMS: "Do you have any other symptoms?" (e.g., headache, neck pain, loss of consciousness)     denies  Protocols used: Nose Injury-A-AH

## 2023-08-05 NOTE — Telephone Encounter (Signed)
 Copied from CRM [id number]. Topic: Clinical - Medical Advice >> Aug 05, 2023  2:42 PM [place] E wrote: Reason for CRM: Patient called in stating that she has a bump on her nose, was seen by PCP last month for this issue, but it has re-appeared. Patient stated bump is not painful, but questioning if she is able to get seen tomorrow when her husband gets seen. Tried to pull up PCP's schedule for tomorrow and the time was not close enough to when patient's husband is getting seen. Patient stated it would only take a few minutes for PCP to look at her nose. Callback number for patient is [phone].      1st attempt made; left vm.

## 2023-08-05 NOTE — Telephone Encounter (Signed)
 Copied from CRM [id number]. Topic: Clinical - Medical Advice >> Aug 05, 2023  2:42 PM [place] E wrote: Reason for CRM: Patient called in stating that she has a bump on her nose, was seen by PCP last month for this issue, but it has re-appeared. Patient stated bump is not painful, but questioning if she is able to get seen tomorrow when her husband gets seen. Tried to pull up PCP's schedule for tomorrow and the time was not close enough to when patient's husband is getting seen. Patient stated it would only take a few minutes for PCP to look at her nose. Callback number for patient is [phone].

## 2023-08-09 NOTE — Progress Notes (Signed)
 Initial neurology clinic note  Bonnie Brady MRN: [id number] DOB: June 21, 1950  Referring provider: Everlina Hock, NP  Primary care provider: Dorrene Gaucher, NP  Reason for consult:  blurry vision per referral, tinnitus per patient  Subjective:  This is [name], a 73 y.o. right-handed female with a medical history of recurrent sinusitis, HLD, pre-diabetes, osteopenia, kidney stones who presents to neurology clinic with tinnitus. The patient is accompanied by husband.  Patient is here for "ear ringing." Symptoms started in 04/2022, when she had multiple sinus infections. She saw her PCP her sent her to her ENT who she had seen in the past for prior sinus infections. She saw ENT in the fall. She had testing including hearing test and MRI brain. She was told she had slight hearing loss in the left ear, but not significant. She was told there was not a clear reason for symptoms. Patient has also seen Dr. Janett Medin who felt symptoms were related to sinusitis per patient. Of note, she was treated with amoxicillin  for the sinusitis.  Symptoms are noticeable in quiet, like at night and not with other sounds, like during the day.  My referral information was for blurry vision. This has resolved and is not a concern per patient. She has a history of migraines, but this is also rare and not a concern.  Last saw Dr. Janett Medin at [facility] on 08/11/23. Per that clinic note: Update 08/11/2023 : She returns for follow-up after last visit a year ago.  She states she has been well and has not had recurrent stroke or TIA symptoms.  However she had had some blurred vision in March and call the office.  She was seen by primary care physician who referred her to an optometrist noted to have thorough eye exam I did not find anything wrong and felt these may be silent migraines.  Patient however has had no further episodes over the last 2 months.  She does complain of ringing in both ears for the last 1 year and  denies decreased hearing.  She was seen by ENT physician who felt she did not have significant ENT abnormality.  She remains on aspirin which is tolerating well without bruising or bleeding.  She is tolerating Crestor  well without muscle aches and pains.  Last lipid profile on 12/14/2022 showed LDL-cholesterol to be 74 mg percent.  Hemoglobin A1c on 06/10/2023 was 6.4%.  MRI scan of the brain done on 01/08/2023 showed no acute abnormality and only changes of chronic small vessel disease.   On note, patient asa 81 mg daily and crestor  20 mg daily remote lacunar infarct seen on MRI brain. She denies any symptoms like sound like remote stroke such as numbness, weakness, etc.  Family history of neurologic disease: mother with stroke   MEDICATIONS:  Outpatient Encounter Medications as of 08/20/2023  Medication Sig Note   aspirin EC 81 MG tablet Take 81 mg by mouth daily. Swallow whole.    Estradiol-Estriol-Progesterone (BI-EST 80:20 PROGESTERONE TD) BI-EST 08/20/2023: Compound cream   Nutritional Supplements (JUICE PLUS FIBRE PO) Take 4 tablets by mouth in the morning and at bedtime.    Omega-3 Fatty Acids (OMEGA 3 PO) Take 1 capsule by mouth in the morning and at bedtime.    rosuvastatin  (CRESTOR ) 20 MG tablet TAKE 1 TABLET BY MOUTH EVERY DAY    [DISCONTINUED] cephALEXin  (KEFLEX ) 500 MG capsule Take 1 capsule (500 mg total) by mouth 3 (three) times daily.    No facility-administered  encounter medications on file as of 08/20/2023.    PAST MEDICAL HISTORY: Past Medical History:  Diagnosis Date   Borderline diabetes    COVID-19 10/2018   Hyperlipidemia    Kidney stone    Osteopenia    Tachycardia    normal cardiology workup, no palpitations in 5-6 yrs   Varicose veins     PAST SURGICAL HISTORY: Past Surgical History:  Procedure Laterality Date   ABDOMINAL HYSTERECTOMY     APPENDECTOMY  1971   bladder strap  2006   CESAREAN SECTION  1978 and 1979   laser of veins  2010   also in 2016 and  2017   MASS EXCISION Right 03/13/2020   Procedure: EXCISION OF RIGHT HIP MASS;  Surgeon: Enid Harry, MD;  Location: [facility];  Service: General;  Laterality: Right;   TONSILLECTOMY  1958    ALLERGIES: Allergies  Allergen Reactions   Latex Rash    Contact with powder.   Tetanus Toxoids Other (See Comments)    Swelling / redness   Nickel Rash    Blisters    FAMILY HISTORY: Family History  Problem Relation Age of Onset   Stroke Mother    COPD Father    COPD Brother    Stroke Maternal Grandfather    Breast cancer Paternal Grandmother    Breast cancer Daughter     SOCIAL HISTORY: Social History   Tobacco Use   Smoking status: Former    Current packs/day: 0.00    Types: Cigarettes    Quit date: 03/30/2008    Years since quitting: 15.4   Smokeless tobacco: Never  Vaping Use   Vaping status: Never Used  Substance Use Topics   Alcohol use: Yes    Comment: occasional    Drug use: No   Social History   Social History Narrative   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    Married   2 children   Enjoys Insurance risk surveyor   retired   Systems developer 1 cup of coffee in AM and occ soda    Objective:  Vital Signs:  BP 110/62   Pulse 71   Ht 5\' 6"  (1.676 m)   Wt 170 lb (77.1 kg)   SpO2 97%   BMI 27.44 kg/m   General: No acute distress.  Patient appears well-groomed.   Head:  Normocephalic/atraumatic Neck: supple, full range of motion Lungs: Non-labored breathing on room air   Neurological Exam: Mental status: alert and oriented, speech fluent and not dysarthric, language intact.  Cranial nerves: CN I: not tested CN II: pupils equal, round and reactive to light, visual fields intact CN III, IV, VI:  full range of motion, no nystagmus, no ptosis CN V: facial sensation intact. CN VII: upper and lower face symmetric CN VIII: hearing intact. Weber test with no localization. Rinne: Air > bone on right and  bone > air on left (suggests conductive hearing loss on left) CN IX, X: uvula midline CN XI: sternocleidomastoid and trapezius muscles intact CN XII: tongue midline  Bulk & Tone: normal, no fasciculations. Motor:  muscle strength 5/5 throughout Deep Tendon Reflexes:  2+ throughout,  toes downgoing.   Sensation:  Pinprick, vibratory sensation intact. Finger to nose testing:  Without dysmetria.   Gait:  Normal station and stride.   Labs and Imaging review: Internal labs: 06/10/23: HbA1c: 6.4 CMP significant for glucose 128, Cr 1.15 CBC w/ diff unremarkable  TSH (12/14/22) wnl Lipid  panel (12/14/22): tChol 164, LDL 74, TG 203.0  02/03/22: ESR 4  Imaging/Procedures: MRI brain w/wo contrast (01/08/23): FINDINGS: Brain: There is no evidence of an acute infarct, intracranial hemorrhage, mass, midline shift, or extra-axial fluid collection. The ventricles and sulci are within normal limits for age. Patchy T2 hyperintensities in the cerebral white matter and pons are unchanged and nonspecific but compatible with moderate chronic small vessel ischemic disease. A chronic lacunar infarct is again noted anteriorly in the left basal ganglia. No abnormal enhancement is identified.   Vascular: Major intracranial vascular flow voids are preserved.   Skull and upper cervical spine: Unremarkable bone marrow signal.   Sinuses/Orbits: Unremarkable orbits. Paranasal sinuses and mastoid air cells are clear.   Other: None.   IMPRESSION: 1. No acute intracranial abnormality or mass. 2. Moderate chronic small vessel ischemic disease.  MRA neck (03/07/22): IMPRESSION: MR angiogram of the neck with and without contrast showing no significant narrowing of either carotid arteries in the neck.  Both vertebral arteries have antegrade flow but the right is dominant.   MRA head (03/07/22): IMPRESSION: MR angiogram of the brain without contrast showing occlusion of the superior division of the right  middle cerebral artery as well as moderate to severe stenosis of distal left posterior cerebral artery in the P2 segment.  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   Assessment/Plan:  Bonnie Brady is a 72 y.o. female who presents for evaluation of tinnitus. Referral was for blurry vision, which has resolved and not an issue per patient. She has a relevant medical history of recurrent sinusitis, HLD, pre-diabetes, osteopenia, kidney stones. Her neurological examination is pertinent for Rinne test suggesting conductive hearing loss on left, but otherwise normal. Available diagnostic data is significant for MRI brain in 12/2022 showed incidental chronic lacunar infarct in left basal ganglia. This would not be related to patient's symptoms. I discussed tinnitus with patient, including potential causes and treatment, which is not done by neurology.  PLAN: -Explained tinnitus to patient and options for treatment, which per my knowledge would be hearing aids -Continue asa 81 mg and crestor  20 mg daily for secondary stroke prevention  -Return to clinic as needed  The impression above as well as the plan as outlined below were extensively discussed with the patient (in the company of husband) who voiced understanding. All questions were answered to their satisfaction.  When available, results of the above investigations and possible further recommendations will be communicated to the patient via telephone/MyChart. Patient to call office if not contacted after expected testing turnaround time.   Total time spent reviewing records, interview, history/exam, documentation, and coordination of care on day of encounter:  50 min   Thank you for allowing me to participate in patient's care.  If I can answer any additional questions, I would be pleased to do so.  Rommie Coats, MD   CC: Dorrene Gaucher, NP [address] [place]  [address]  CC: Referring provider: Everlina Hock, NP [address] [place],  [place] [address]

## 2023-08-11 ENCOUNTER — Encounter: Payer: Self-pay | Admitting: Neurology

## 2023-08-11 ENCOUNTER — Ambulatory Visit (INDEPENDENT_AMBULATORY_CARE_PROVIDER_SITE_OTHER): Payer: Medicare Other | Admitting: Neurology

## 2023-08-11 VITALS — BP 130/83 | HR 70 | Ht 66.0 in | Wt 173.4 lb

## 2023-08-11 DIAGNOSIS — H9313 Tinnitus, bilateral: Secondary | ICD-10-CM | POA: Diagnosis not present

## 2023-08-11 DIAGNOSIS — I639 Cerebral infarction, unspecified: Secondary | ICD-10-CM | POA: Diagnosis not present

## 2023-08-11 NOTE — Patient Instructions (Addendum)
 I had a long discussion with the patient regarding remote silent cerebral infarct and she seems to be doing well from the neurovascular standpoint.  Recommend she continue aspirin 81 mg daily for stroke prevention and maintain aggressive risk factor modification with blood pressure goal below 140/90, lipids with LDL cholesterol goal below 70 mg diabetes with hemoglobin A1c goal below 6.5  She was advised to eat a healthy diet with lots of fruits, vegetables, whole grains and be active and exercise regularly and lose weight.  She will return for follow-up in the future only as needed or call earlier if necessary.

## 2023-08-11 NOTE — Progress Notes (Signed)
 [facility] [address] [place]. [place] [address] [phone]       OFFICE FOLLOW-UP VISIT NOTE  [name] Date of Birth:  [birth year] Medical Record Number:  [id number]   Referring OZ:DGUYQI Chase  Reason for Referral: Abnormal CT scan and headache  HPI: Initial visit 02/10/2022 [name] is a pleasant 73 year old Caucasian lady seen today for initial office consultation visit for abnormal CT scan of the head.  She is accompanied by her husband.  History is obtained  them and review of electronic medical records, referral notes and I personally reviewed pertinent available imaging films in PACS.  She has past medical history of hyperlipidemia, borderline diabetes, osteopenia and tachycardia.  She states that she woke up on 01/30/2022 with a feeling of irritation in the scalp on the top of the head.  He describes this as a constant pressure and discomfort.  He went for a hair dressing appointment and noticed that there was some redness at that site.  She took Aleve 200 mg 1 tablet this did not help.  This lasted for 10 straight days and was constant mild in intensity but not disabling.  She denied any loss of vision, blurred vision, scalp tenderness, jaw claudication.  She has no prior history of migraines headaches symptomatic.  Surprisingly this morning she noticed that the headache is gone and has not come back.  She saw her primary care physician who did lab work on 02/03/2022 and ESR was normal CMP and CBC were unremarkable.  CT scan of the head was done on 02/04/2022 which I personally reviewed shows no acute abnormality.  There is area of low density in the left internal capsule suggestive of remote age lacunar infarct.  There are also changes of age-related small vessel disease.  Patient denies any prior history of strokes or TIAs or focal extremity weakness, slurred speech right-sided weakness to go with subcortical infarct noted on CT scan.  She does however  have family history of strokes in her mother as well as maternal grandfather.  He has prior history of chest pain and tachycardia and sees Dr. Lauro Portal.  Aspirin daily as well as Crestor .  Lab work on 01/02/2022 showed hemoglobin A1c to be borderline at 6.2 LDL cholesterol was 66 mg percent on 12/08/21.  She has no complaints today.  Update 08/11/2022 : She returns for follow-up after last visit 5 months ago.  She is accompanied by her husband.  He had recurrence of occipital headaches a few weeks after she saw me.  This lasted for couple of weeks then resolved.  Headache was mild and she did not need any medications.  She did undergo an MRI scan of the brain on 03/07/2022 which shows age-related changes of chronic small vessel disease.  MR angiogram of the neck shows no significant large vessel stenosis.  MR angiogram of the brain shows occlusion of the right superior division in the mid portion and moderate stenosis of the left posterior cerebral artery in the P2 segment.  Patient remains on aspirin she is tolerating well without bruising or bleeding.  His blood pressure is in the good control.  She is tolerating Crestor  well without side effects.  Patient has episode of sinusitis.  2 weeks ago has developed new onset of tinnitus in both ears.  She was treated with antibiotics and prednisone  without resolution.  She is currently finishing up course of antibiotics of 2 weeks of Augmentin  and home.  She has an appointment  to see an ENT.  She denies any decreased hearing or vertigo.  He has not had any stroke or TIA symptoms Update 08/11/2023 : She returns for follow-up after last visit a year ago.  She states she has been well and has not had recurrent stroke or TIA symptoms.  However she had had some blurred vision in March and call the office.  She was seen by primary care physician who referred her to an optometrist noted to have thorough eye exam I did not find anything wrong and felt these may be silent  migraines.  Patient however has had no further episodes over the last 2 months.  She does complain of ringing in both ears for the last 1 year and denies decreased hearing.  She was seen by ENT physician who felt she did not have significant ENT abnormality.  She remains on aspirin which is tolerating well without bruising or bleeding.  She is tolerating Crestor  well without muscle aches and pains.  Last lipid profile on 12/14/2022 showed LDL-cholesterol to be 74 mg percent.  Hemoglobin A1c on 06/10/2023 was 6.4%.  MRI scan of the brain done on 01/08/2023 showed no acute abnormality and only changes of chronic small vessel disease. ROS:   14 system review of systems is positive for headache, scalp irritation, pressure and all other systems negative  PMH:  Past Medical History:  Diagnosis Date   Borderline diabetes    COVID-19 10/2018   Hyperlipidemia    Kidney stone    Osteopenia    Tachycardia    normal cardiology workup, no palpitations in 5-6 yrs   Varicose veins     Social History:  Social History   Socioeconomic History   Marital status: Married    Spouse name: Not on file   Number of children: Not on file   Years of education: Not on file   Highest education level: Associate degree: occupational, Scientist, product/process development, or vocational program  Occupational History   Not on file  Tobacco Use   Smoking status: Former    Current packs/day: 0.00    Types: Cigarettes    Quit date: 03/30/2008    Years since quitting: 15.3   Smokeless tobacco: Never  Vaping Use   Vaping status: Never Used  Substance and Sexual Activity   Alcohol use: Yes    Comment: occasional    Drug use: No   Sexual activity: Not on file  Other Topics Concern   Not on file  Social History Narrative   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    Married   2 children   Enjoys Insurance risk surveyor      Social Drivers of Health   Financial Resource Strain: Low Risk  (06/10/2023)    Overall Financial Resource Strain (CARDIA)    Difficulty of Paying Living Expenses: Not hard at all  Food Insecurity: No Food Insecurity (06/10/2023)   Hunger Vital Sign    Worried About Running Out of Food in the Last Year: Never true    Ran Out of Food in the Last Year: Never true  Transportation Needs: No Transportation Needs (06/10/2023)   PRAPARE - Administrator, Civil Service (Medical): No    Lack of Transportation (Non-Medical): No  Physical Activity: Insufficiently Active (06/10/2023)   Exercise Vital Sign    Days of Exercise per Week: 2 days    Minutes of Exercise per Session: 20 min  Stress: No Stress Concern Present (06/10/2023)  [employer] of Occupational Health - Occupational Stress Questionnaire    Feeling of Stress : Only a little  Social Connections: Socially Integrated (06/10/2023)   Social Connection and Isolation Panel [NHANES]    Frequency of Communication with Friends and Family: More than three times a week    Frequency of Social Gatherings with Friends and Family: Twice a week    Attends Religious Services: More than 4 times per year    Active Member of [employer] or Organizations: Yes    Attends Engineer, structural: More than 4 times per year    Marital Status: Married  Catering manager Violence: Not At Risk (01/02/2022)   Humiliation, Afraid, Rape, and Kick questionnaire    Fear of Current or Ex-Partner: No    Emotionally Abused: No    Physically Abused: No    Sexually Abused: No    Medications:   Current Outpatient Medications on File Prior to Visit  Medication Sig Dispense Refill   aspirin EC 81 MG tablet Take 81 mg by mouth daily. Swallow whole.     Nutritional Supplements (JUICE PLUS FIBRE PO) Take 4 tablets by mouth in the morning and at bedtime.     Omega-3 Fatty Acids (OMEGA 3 PO) Take 1 capsule by mouth in the morning and at bedtime.     rosuvastatin  (CRESTOR ) 20 MG tablet TAKE 1 TABLET BY MOUTH EVERY DAY 90 tablet 1    cephALEXin  (KEFLEX ) 500 MG capsule Take 1 capsule (500 mg total) by mouth 3 (three) times daily. 21 capsule 0   No current facility-administered medications on file prior to visit.    Allergies:   Allergies  Allergen Reactions   Latex Rash    Contact with powder.   Tetanus Toxoids Other (See Comments)    Swelling / redness   Nickel Rash    Blisters    Physical Exam General: well developed, well nourished pleasant middle-age Caucasian lady, seated, in no evident distress Head: head normocephalic and atraumatic.  No tenderness of superficial temporal arteries bilaterally. Neck: supple with no carotid or supraclavicular bruits Cardiovascular: regular rate and rhythm, no murmurs Musculoskeletal: no deformity.  Mild tightness of posterior neck muscles without restriction of movements. Skin:  no rash/petichiae Vascular:  Normal pulses all extremities  Neurologic Exam Mental Status: Awake and fully alert. Oriented to place and time. Recent and remote memory intact. Attention span, concentration and fund of knowledge appropriate. Mood and affect appropriate.  Cranial Nerves: Fundoscopic exam not done s. Pupils equal, briskly reactive to light. Extraocular movements full without nystagmus. Visual fields full to confrontation. Hearing intact. Facial sensation intact. Face, tongue, palate moves normally and symmetrically.  Motor: Normal bulk and tone. Normal strength in all tested extremity muscles. Sensory.: intact to touch , pinprick , position and vibratory sensation.  Coordination: Rapid alternating movements normal in all extremities. Finger-to-nose and heel-to-shin performed accurately bilaterally. Gait and Station: Arises from chair without difficulty. Stance is normal. Gait demonstrates normal stride length and balance . Able to heel, toe and tandem walk with slight difficulty.  Reflexes: 1+ and symmetric. Toes downgoing.      ASSESSMENT: 73 year old Caucasian lady with  Abnormal  brain imaging suggestive of left subcortical silent lacunar infarct with no clinical symptomatology to suggest prior TIA or stroke.  Vascular risk factors of mild hyperlipidemia and borderline diabetes only.  Recent episodes of transient blurred vision which appear to have resolved and are of unclear etiology.  No significant associated headaches or other features to  suggest temporal arteritis     PLAN: I had a long discussion with the patient regarding remote silent cerebral infarct and she seems to be doing well from the neurovascular standpoint.  Recommend she continue aspirin 81 mg daily for stroke prevention and maintain aggressive risk factor modification with blood pressure goal below 140/90, lipids with LDL cholesterol goal below 70 mg diabetes with hemoglobin A1c goal below 6.5  She was advised to eat a healthy diet with lots of fruits, vegetables, whole grains and be active and exercise regularly and lose weight.  She will return for follow-up in the future only as needed or call earlier if necessary.  .  Greater than 50% time during this 35-minute visit was spent on counseling and coordination of care about her new complaints of tinnitus as well as abnormal CT scan and need for further evaluation and treatment and  Ardella Beaver, MD Note: This document was prepared with digital dictation and possible smart phrase technology. Any transcriptional errors that result from this process are unintentional.

## 2023-08-20 ENCOUNTER — Encounter: Payer: Self-pay | Admitting: Neurology

## 2023-08-20 ENCOUNTER — Ambulatory Visit (INDEPENDENT_AMBULATORY_CARE_PROVIDER_SITE_OTHER): Admitting: Neurology

## 2023-08-20 VITALS — BP 110/62 | HR 71 | Ht 66.0 in | Wt 170.0 lb

## 2023-08-20 DIAGNOSIS — I639 Cerebral infarction, unspecified: Secondary | ICD-10-CM

## 2023-08-20 DIAGNOSIS — H9012 Conductive hearing loss, unilateral, left ear, with unrestricted hearing on the contralateral side: Secondary | ICD-10-CM

## 2023-08-20 DIAGNOSIS — H9313 Tinnitus, bilateral: Secondary | ICD-10-CM | POA: Diagnosis not present

## 2023-08-20 NOTE — Patient Instructions (Signed)
 I saw you today for ringing in your ears. It appears you have hearing loss in your left ear, as you had already been told. This is likely the source. I do not see any neurologic deficits to suggest another cause. Your recurrent sinus issues may be to blame for the hearing problems.  You can discuss with audiology hearing aids that can treat ear ringing if it is bothersome.  Continue aspirin 81 mg daily and Crestor  20 mg daily to help reduce risk of future stroke.  You may follow up with me as needed. Please let me know if you have any questions or concerns.  The physicians and staff at [facility] are committed to providing excellent care. You may receive a survey requesting feedback about your experience at our office. We strive to receive "very good" responses to the survey questions. If you feel that your experience would prevent you from giving the office a "very good " response, please contact our office to try to remedy the situation. We may be reached at [phone]. Thank you for taking the time out of your busy day to complete the survey.  Rommie Coats, MD [facility]

## 2023-10-14 ENCOUNTER — Ambulatory Visit: Payer: Self-pay

## 2023-10-14 NOTE — Telephone Encounter (Signed)
 Noted that pt has appointment scheduled for tomorrow.

## 2023-10-14 NOTE — Telephone Encounter (Signed)
 FYI Only or Action Required?: FYI only for provider.  Patient was last seen in primary care on 07/07/2023 by Daryl Setter, NP.  Called Nurse Triage reporting Dizziness.  Symptoms began several days ago.  Interventions attempted: Prescription medications: Meclizine , Sudafed and Rest, hydration, or home remedies.  Symptoms are: gradually worsening.  Triage Disposition: See PCP When Office is Open (Within 3 Days)  Patient/caregiver understands and will follow disposition?: Yes     Copied from CRM [id number]. Topic: Clinical - Red Word Triage >> Oct 14, 2023  2:43 PM Rosina BIRCH wrote: Red Word that prompted transfer to Nurse Triage: patient husband called stating the patient has severe dizziness and she was given medication to help but it is not working   Reason for Disposition  [1] MODERATE dizziness (e.g., vertigo; feels very unsteady, interferes with normal activities) AND [2] has been evaluated by doctor (or NP/PA) for this  Answer Assessment - Initial Assessment Questions 1. DESCRIPTION: Describe your dizziness.     Very dizziness  2. VERTIGO: Do you feel like either you or the room is spinning or tilting?      Head spinning, not getting better with meclizine   3. LIGHTHEADED: Do you feel lightheaded? (e.g., somewhat faint, woozy, weak upon standing)     No 4. SEVERITY: How bad is it?  Can you walk?     Feels unsteady when walking  5. ONSET:  When did the dizziness begin?     2-3 days  6. AGGRAVATING FACTORS: Does anything make it worse? (e.g., standing, change in head position)     Can't recall aggravtaing factors  7. CAUSE: What do you think is causing the dizziness?     Concerned about inner ear infection  8. RECURRENT SYMPTOM: Have you had dizziness before? If Yes, ask: When was the last time? What happened that time?     Yes, treated with meclizinem but its not working  9. OTHER SYMPTOMS: Do you have any other symptoms? (e.g., earache,  headache, numbness, tinnitus, vomiting, weakness)     Left ear is starting to hurt  10. PREGNANCY: Is there any chance you are pregnant? When was your last menstrual period?       No  Protocols used: Dizziness - Vertigo-A-AH

## 2023-10-15 ENCOUNTER — Ambulatory Visit: Admitting: Student

## 2023-10-15 ENCOUNTER — Encounter: Payer: Self-pay | Admitting: Student

## 2023-10-15 ENCOUNTER — Ambulatory Visit: Payer: Self-pay | Admitting: Student

## 2023-10-15 VITALS — BP 128/80 | HR 75 | Temp 97.7°F | Ht 66.0 in | Wt 174.2 lb

## 2023-10-15 DIAGNOSIS — R42 Dizziness and giddiness: Secondary | ICD-10-CM

## 2023-10-15 DIAGNOSIS — R7303 Prediabetes: Secondary | ICD-10-CM

## 2023-10-15 LAB — COMPREHENSIVE METABOLIC PANEL WITH GFR
ALT: 31 U/L (ref 0–35)
AST: 27 U/L (ref 0–37)
Albumin: 4.4 g/dL (ref 3.5–5.2)
Alkaline Phosphatase: 71 U/L (ref 39–117)
BUN: 19 mg/dL (ref 6–23)
CO2: 29 meq/L (ref 19–32)
Calcium: 9.6 mg/dL (ref 8.4–10.5)
Chloride: 105 meq/L (ref 96–112)
Creatinine, Ser: 1.02 mg/dL (ref 0.40–1.20)
GFR: 54.96 mL/min — ABNORMAL LOW (ref >60.00)
Glucose, Bld: 94 mg/dL (ref 70–99)
Potassium: 4.1 meq/L (ref 3.5–5.1)
Sodium: 140 meq/L (ref 135–145)
Total Bilirubin: 0.5 mg/dL (ref 0.2–1.2)
Total Protein: 6.9 g/dL (ref 6.0–8.3)

## 2023-10-15 LAB — POCT INFLUENZA A/B
Influenza A, POC: NEGATIVE
Influenza B, POC: NEGATIVE

## 2023-10-15 LAB — CBC WITH DIFFERENTIAL/PLATELET
Basophils Absolute: 0.1 K/uL (ref 0.0–0.1)
Basophils Relative: 0.8 % (ref 0.0–3.0)
Eosinophils Absolute: 0.1 K/uL (ref 0.0–0.7)
Eosinophils Relative: 1.9 % (ref 0.0–5.0)
HCT: 46.4 % — ABNORMAL HIGH (ref 36.0–46.0)
Hemoglobin: 15.4 g/dL — ABNORMAL HIGH (ref 12.0–15.0)
Lymphocytes Relative: 25.2 % (ref 12.0–46.0)
Lymphs Abs: 1.6 K/uL (ref 0.7–4.0)
MCHC: 33.2 g/dL (ref 30.0–36.0)
MCV: 94.8 fl (ref 78.0–100.0)
Monocytes Absolute: 0.5 K/uL (ref 0.1–1.0)
Monocytes Relative: 8.4 % (ref 3.0–12.0)
Neutro Abs: 3.9 K/uL (ref 1.4–7.7)
Neutrophils Relative %: 63.7 % (ref 43.0–77.0)
Platelets: 201 K/uL (ref 150.0–400.0)
RBC: 4.9 Mil/uL (ref 3.87–5.11)
RDW: 13 % (ref 11.5–15.5)
WBC: 6.2 K/uL (ref 4.0–10.5)

## 2023-10-15 LAB — POCT RAPID STREP A (OFFICE): Rapid Strep A Screen: NEGATIVE

## 2023-10-15 LAB — HEMOGLOBIN A1C: Hgb A1c MFr Bld: 6.4 % (ref 4.6–6.5)

## 2023-10-15 LAB — POC COVID19 BINAXNOW: SARS Coronavirus 2 Ag: NEGATIVE

## 2023-10-15 MED ORDER — MECLIZINE HCL 25 MG PO TABS: 25.0000 mg | ORAL_TABLET | Freq: Three times a day (TID) | ORAL | 0 refills | Status: DC | PRN

## 2023-10-15 NOTE — Progress Notes (Addendum)
 Chief Complaint  Patient presents with   Acute Visit    Left Ear pain and vertigo     Bonnie Brady is 73 y.o. presents for acute visit complaints of dizziness  PMHx  recurrent sinusitis, HLD, pre-diabetes, osteopenia, kidney stones, BPPV, vertigo and tinnitus, with a slight hearing loss in the left ear and persistent ringing in both ears. A complete scan and MR performed by ENT 2024- no acute findings but showed old  incidental chronic lacunar infarct in left basal ganglia.  She is followed by neurology.  Duration: 1 week Pass out? No Spinning? No Recent illness/fever? No Headache? No Neurologic signs? No Change in PO intake? No  Patient denies fever, chills, SOB, CP, palpitations, dyspnea, edema, HA, vision changes, N/V/D, abdominal pain, urinary symptoms, rash, weight changes, and recent illness or hospitalizations.   ROS:  Neuro: As noted in HPI Eyes: No vision changes  Past Medical History:  Diagnosis Date   Borderline diabetes    COVID-19 10/2018   Hyperlipidemia    Kidney stone    Osteopenia    Tachycardia    normal cardiology workup, no palpitations in 5-6 yrs   Varicose veins     Family History  Problem Relation Age of Onset   Stroke Mother    COPD Father    COPD Brother    Stroke Maternal Grandfather    Breast cancer Paternal Grandmother    Breast cancer Daughter     Allergies as of 10/15/2023       Reactions   Latex Rash   Contact with powder.   Tetanus Toxoids Other (See Comments)   Swelling / redness   Nickel Rash   Blisters        Medication List        Accurate as of October 15, 2023 11:04 AM. If you have any questions, ask your nurse or doctor.          aspirin EC 81 MG tablet Take 81 mg by mouth daily. Swallow whole.   BI-EST 80:20 PROGESTERONE TD BI-EST   JUICE PLUS FIBRE PO Take 4 tablets by mouth in the morning and at bedtime.   meclizine  25 MG tablet Commonly known as: ANTIVERT  Take 1 tablet (25 mg total) by mouth 3  (three) times daily as needed for dizziness or nausea. Started by: Harlene LITTIE Jolly   OMEGA 3 PO Take 1 capsule by mouth in the morning and at bedtime.   rosuvastatin  20 MG tablet Commonly known as: CRESTOR  TAKE 1 TABLET BY MOUTH EVERY DAY        BP 128/80 (Patient Position: Sitting, Cuff Size: Normal)   Pulse 75   Temp 97.7 F (36.5 C)   Ht 5' 6 (1.676 m)   Wt 174 lb 3.2 oz (79 kg)   SpO2 95%   BMI 28.12 kg/m  General: Awake, alert, appears stated age Eyes: PERRLA, EOMi Ears: Patent, TM's neg b/l Heart: RRR, no murmurs, no carotid bruits Lungs: CTAB, no accessory muscle use MSK: 5/5 strength throughout, gait normal Neuro: No cerebellar signs, Dix-Hall-Pike negative. Psych: Age appropriate judgment and insight, normal mood and affect  Dizziness - Plan: CBC with Differential/Platelet, Comprehensive metabolic panel with GFR, Ambulatory referral to ENT  Borderline diabetes - Plan: Hemoglobin A1c   Dizziness Dizziness and lightheadedness, possibly related to hydration.  Check CMP, CBC, A1c. Refer to ENT Rx-meclizine  sent to pharmacy Encourage consistent hydration and protein intake, encourage changing positions slowly.Monitor symptoms and report any escalation.  Orders  as above. F/u prn. Pt voiced understanding and agreement to the plan.  Harlene LITTIE Jolly, DNP, AGNP-C 10/15/23

## 2023-10-15 NOTE — Addendum Note (Signed)
 Addended by: WILLO KIRKE DEL on: 10/15/2023 11:39 AM   Modules accepted: Orders

## 2023-11-01 ENCOUNTER — Other Ambulatory Visit: Payer: Self-pay | Admitting: Student

## 2023-11-01 MED ORDER — MECLIZINE HCL 25 MG PO TABS: 25.0000 mg | ORAL_TABLET | Freq: Three times a day (TID) | ORAL | 1 refills | Status: DC | PRN

## 2023-11-01 NOTE — Telephone Encounter (Signed)
 Copied from CRM #[id number]. Topic: Clinical - Medication Question >> Nov 01, 2023 11:24 AM Lavanda D wrote: Reason for CRM: Patient is experiencing dizziness, and has taken 1 pill of meclizine  and has 1 one more pill left. She said meclizine  is the only thing that helps keep her dizziness under control and she is very concerned to go without it for up to 3 days potentially due to med refill turn-around. She is wondering if there is any possibility that it can be filled today? Patient declined NT as she stated she is not sure what help that would be, said dizziness is nothing new to her. Please give her a call back to confirm if this would be a possibility.

## 2023-11-01 NOTE — Telephone Encounter (Signed)
 Copied from CRM [id number]. Topic: Clinical - Medication Refill >> Nov 01, 2023 11:14 AM Lavanda D wrote: Medication: meclizine  (ANTIVERT ) 25 MG tablet  Has the patient contacted their pharmacy? No (Agent: If no, request that the patient contact the pharmacy for the refill. If patient does not wish to contact the pharmacy document the reason why and proceed with request.) (Agent: If yes, when and what did the pharmacy advise?)  This is the patient's preferred pharmacy:  [id number] - [place], [place] - [address] [place] [address] Phone: [phone] Fax: [phone]  Is this the correct pharmacy for this prescription? Yes If no, delete pharmacy and type the correct one.   Has the prescription been filled recently? No  Is the patient out of the medication? Yes  Has the patient been seen for an appointment in the last year OR does the patient have an upcoming appointment? Yes  Can we respond through MyChart? Yes  Agent: Please be advised that Rx refills may take up to 3 business days. We ask that you follow-up with your pharmacy.

## 2023-11-12 DIAGNOSIS — H8102 Meniere's disease, left ear: Secondary | ICD-10-CM | POA: Diagnosis not present

## 2023-11-12 DIAGNOSIS — H8111 Benign paroxysmal vertigo, right ear: Secondary | ICD-10-CM | POA: Diagnosis not present

## 2023-11-12 DIAGNOSIS — H903 Sensorineural hearing loss, bilateral: Secondary | ICD-10-CM | POA: Diagnosis not present

## 2023-11-17 DIAGNOSIS — R42 Dizziness and giddiness: Secondary | ICD-10-CM | POA: Diagnosis not present

## 2023-11-22 MED ORDER — MECLIZINE HCL 25 MG PO TABS: 25.0000 mg | ORAL_TABLET | Freq: Three times a day (TID) | ORAL | 1 refills | Status: AC | PRN

## 2023-11-22 NOTE — Addendum Note (Signed)
 Addended by: DARYL SETTER on: 11/22/2023 04:25 PM   Modules accepted: Orders

## 2023-11-24 DIAGNOSIS — H8190 Unspecified disorder of vestibular function, unspecified ear: Secondary | ICD-10-CM | POA: Diagnosis not present

## 2023-11-24 DIAGNOSIS — R262 Difficulty in walking, not elsewhere classified: Secondary | ICD-10-CM | POA: Diagnosis not present

## 2023-11-24 DIAGNOSIS — M6281 Muscle weakness (generalized): Secondary | ICD-10-CM | POA: Diagnosis not present

## 2023-12-01 DIAGNOSIS — R262 Difficulty in walking, not elsewhere classified: Secondary | ICD-10-CM | POA: Diagnosis not present

## 2023-12-01 DIAGNOSIS — H8190 Unspecified disorder of vestibular function, unspecified ear: Secondary | ICD-10-CM | POA: Diagnosis not present

## 2023-12-01 DIAGNOSIS — M6281 Muscle weakness (generalized): Secondary | ICD-10-CM | POA: Diagnosis not present

## 2023-12-03 DIAGNOSIS — M6281 Muscle weakness (generalized): Secondary | ICD-10-CM | POA: Diagnosis not present

## 2023-12-03 DIAGNOSIS — H8190 Unspecified disorder of vestibular function, unspecified ear: Secondary | ICD-10-CM | POA: Diagnosis not present

## 2023-12-03 DIAGNOSIS — R262 Difficulty in walking, not elsewhere classified: Secondary | ICD-10-CM | POA: Diagnosis not present

## 2023-12-07 ENCOUNTER — Telehealth: Payer: Self-pay

## 2023-12-07 NOTE — Telephone Encounter (Signed)
 Error/Investigation Details: Pulled call [employer] JTAPI ID: [id number]). Confirmed that Specialist did not confirm name of caller. Someone with Dr. Carolee Vaught's office at [facility] called to follow up on Brady fax sent over on 8/15 requesting to start the patient on the Hydrochlorothiazide prescription. Callback for ENT office is correct. Specialist will be coached accordingly on obtaining full details for caller identity.    Dossie Swor, [facility]   12/07/2023  2:17 PM  Can call be pulled? Was it someone at [facility] that called or the Pt- [facility]mers? Needing further information from call please.     Antiono Ettinger, CMA   12/07/2023  2:16 PM  CRM # [id number] Owner: Aram Mercedes HERO Status: Unresolved Open  Priority: Routine Created on: 12/06/2023 03:29 PM By: Aram Mercedes HERO    Primary Information   Source  Bonnie Brady (Patient)   Subject  Bonnie Cherene LABOR (Patient)   Topic  General - Other    Communication  Reason for CRM: Lolitha from [facility] called and stated that Brady fax was sent over to NP Osullivan in regards to the patient having to be started on Hydrochlorothiazide. Renna can be reached at [phone]       Patient Information   Patient Name Gender DOB SSN  Bonnie Brady, Bonnie Brady Female 1950-11-12 [id number]    Contacts   Contact Date/Time Type Contact Phone/Fax  12/06/2023 03:23 PM EDT Phone (Incoming) ENT [place] (Self) [phone]  12/06/2023 03:31 PM EDT Phone (Incoming) Bonnie Brady (Self) [phone]

## 2023-12-07 NOTE — Telephone Encounter (Signed)
 Can you see if we have anything from [facility] in [facility] faxed stack please?

## 2023-12-08 DIAGNOSIS — M6281 Muscle weakness (generalized): Secondary | ICD-10-CM | POA: Diagnosis not present

## 2023-12-08 DIAGNOSIS — H8190 Unspecified disorder of vestibular function, unspecified ear: Secondary | ICD-10-CM | POA: Diagnosis not present

## 2023-12-08 DIAGNOSIS — R262 Difficulty in walking, not elsewhere classified: Secondary | ICD-10-CM | POA: Diagnosis not present

## 2023-12-08 NOTE — Telephone Encounter (Signed)
 Nothing received yet from ENT

## 2023-12-09 ENCOUNTER — Other Ambulatory Visit: Payer: Self-pay | Admitting: Family

## 2023-12-10 DIAGNOSIS — M6281 Muscle weakness (generalized): Secondary | ICD-10-CM | POA: Diagnosis not present

## 2023-12-10 DIAGNOSIS — H8190 Unspecified disorder of vestibular function, unspecified ear: Secondary | ICD-10-CM | POA: Diagnosis not present

## 2023-12-10 DIAGNOSIS — R262 Difficulty in walking, not elsewhere classified: Secondary | ICD-10-CM | POA: Diagnosis not present

## 2023-12-10 DIAGNOSIS — J301 Allergic rhinitis due to pollen: Secondary | ICD-10-CM | POA: Diagnosis not present

## 2023-12-10 DIAGNOSIS — H8101 Meniere's disease, right ear: Secondary | ICD-10-CM | POA: Diagnosis not present

## 2023-12-10 DIAGNOSIS — H8191 Unspecified disorder of vestibular function, right ear: Secondary | ICD-10-CM | POA: Diagnosis not present

## 2023-12-15 DIAGNOSIS — R262 Difficulty in walking, not elsewhere classified: Secondary | ICD-10-CM | POA: Diagnosis not present

## 2023-12-15 DIAGNOSIS — M6281 Muscle weakness (generalized): Secondary | ICD-10-CM | POA: Diagnosis not present

## 2023-12-15 DIAGNOSIS — H8190 Unspecified disorder of vestibular function, unspecified ear: Secondary | ICD-10-CM | POA: Diagnosis not present

## 2023-12-20 DIAGNOSIS — H2513 Age-related nuclear cataract, bilateral: Secondary | ICD-10-CM | POA: Diagnosis not present

## 2023-12-20 DIAGNOSIS — H8190 Unspecified disorder of vestibular function, unspecified ear: Secondary | ICD-10-CM | POA: Diagnosis not present

## 2023-12-20 DIAGNOSIS — H43811 Vitreous degeneration, right eye: Secondary | ICD-10-CM | POA: Diagnosis not present

## 2023-12-20 DIAGNOSIS — H5203 Hypermetropia, bilateral: Secondary | ICD-10-CM | POA: Diagnosis not present

## 2023-12-20 DIAGNOSIS — M6281 Muscle weakness (generalized): Secondary | ICD-10-CM | POA: Diagnosis not present

## 2023-12-20 DIAGNOSIS — G43109 Migraine with aura, not intractable, without status migrainosus: Secondary | ICD-10-CM | POA: Diagnosis not present

## 2023-12-20 DIAGNOSIS — R262 Difficulty in walking, not elsewhere classified: Secondary | ICD-10-CM | POA: Diagnosis not present

## 2023-12-21 NOTE — Telephone Encounter (Signed)
 Copied from CRM #[id number]. Topic: General - Other >> Dec 21, 2023  9:43 AM Thersia BROCKS wrote: Reason for CRM: Particia from [facility] called in regarding paperwork that was faxed, stated they have faxed it 3 times now , will fax it again  [phone]  [phone]  Have not receive records

## 2023-12-28 DIAGNOSIS — H8190 Unspecified disorder of vestibular function, unspecified ear: Secondary | ICD-10-CM | POA: Diagnosis not present

## 2023-12-28 DIAGNOSIS — R262 Difficulty in walking, not elsewhere classified: Secondary | ICD-10-CM | POA: Diagnosis not present

## 2023-12-28 DIAGNOSIS — M6281 Muscle weakness (generalized): Secondary | ICD-10-CM | POA: Diagnosis not present

## 2023-12-30 DIAGNOSIS — R262 Difficulty in walking, not elsewhere classified: Secondary | ICD-10-CM | POA: Diagnosis not present

## 2023-12-30 DIAGNOSIS — M6281 Muscle weakness (generalized): Secondary | ICD-10-CM | POA: Diagnosis not present

## 2023-12-30 DIAGNOSIS — H8190 Unspecified disorder of vestibular function, unspecified ear: Secondary | ICD-10-CM | POA: Diagnosis not present

## 2023-12-31 ENCOUNTER — Other Ambulatory Visit: Payer: Self-pay | Admitting: Family

## 2023-12-31 NOTE — Telephone Encounter (Signed)
 Copied from CRM [id number]. Topic: Clinical - Medication Refill >> Dec 31, 2023  1:28 PM Lauren C wrote: Medication: rosuvastatin  (CRESTOR ) 20 MG tablet   Has the patient contacted their pharmacy? No (Agent: If no, request that the patient contact the pharmacy for the refill. If patient does not wish to contact the pharmacy document the reason why and proceed with request.) (Agent: If yes, when and what did the pharmacy advise?)  This is the patient's preferred pharmacy:  [id number] [place], [place] - [address] [place] [address] Phone: [phone] Fax: [phone]  Is this the correct pharmacy for this prescription? Yes If no, delete pharmacy and type the correct one.   Has the prescription been filled recently? Yes  Is the patient out of the medication? No, 2 days  Has the patient been seen for an appointment in the last year OR does the patient have an upcoming appointment? Yes  Can we respond through MyChart? Yes  Agent: Please be advised that Rx refills may take up to 3 business days. We ask that you follow-up with your pharmacy.

## 2024-01-03 DIAGNOSIS — M6281 Muscle weakness (generalized): Secondary | ICD-10-CM | POA: Diagnosis not present

## 2024-01-03 DIAGNOSIS — H8190 Unspecified disorder of vestibular function, unspecified ear: Secondary | ICD-10-CM | POA: Diagnosis not present

## 2024-01-03 DIAGNOSIS — R262 Difficulty in walking, not elsewhere classified: Secondary | ICD-10-CM | POA: Diagnosis not present

## 2024-01-04 DIAGNOSIS — M6281 Muscle weakness (generalized): Secondary | ICD-10-CM | POA: Diagnosis not present

## 2024-01-04 DIAGNOSIS — H8190 Unspecified disorder of vestibular function, unspecified ear: Secondary | ICD-10-CM | POA: Diagnosis not present

## 2024-01-04 DIAGNOSIS — R262 Difficulty in walking, not elsewhere classified: Secondary | ICD-10-CM | POA: Diagnosis not present

## 2024-01-07 MED ORDER — ROSUVASTATIN CALCIUM 20 MG PO TABS: 20.0000 mg | ORAL_TABLET | Freq: Every day | ORAL | 1 refills | Status: AC

## 2024-01-11 DIAGNOSIS — M6281 Muscle weakness (generalized): Secondary | ICD-10-CM | POA: Diagnosis not present

## 2024-01-11 DIAGNOSIS — R262 Difficulty in walking, not elsewhere classified: Secondary | ICD-10-CM | POA: Diagnosis not present

## 2024-01-11 DIAGNOSIS — H8190 Unspecified disorder of vestibular function, unspecified ear: Secondary | ICD-10-CM | POA: Diagnosis not present

## 2024-01-13 DIAGNOSIS — H8190 Unspecified disorder of vestibular function, unspecified ear: Secondary | ICD-10-CM | POA: Diagnosis not present

## 2024-01-13 DIAGNOSIS — R262 Difficulty in walking, not elsewhere classified: Secondary | ICD-10-CM | POA: Diagnosis not present

## 2024-01-13 DIAGNOSIS — M6281 Muscle weakness (generalized): Secondary | ICD-10-CM | POA: Diagnosis not present

## 2024-02-02 ENCOUNTER — Ambulatory Visit: Admitting: Family

## 2024-02-02 ENCOUNTER — Ambulatory Visit: Admitting: *Deleted

## 2024-02-02 VITALS — BP 123/78 | HR 73 | Temp 97.8°F | Ht 65.5 in | Wt 173.8 lb

## 2024-02-02 VITALS — BP 123/78 | HR 73 | Temp 97.8°F | Resp 16 | Ht 66.0 in | Wt 173.8 lb

## 2024-02-02 DIAGNOSIS — L659 Nonscarring hair loss, unspecified: Secondary | ICD-10-CM | POA: Diagnosis not present

## 2024-02-02 DIAGNOSIS — H8190 Unspecified disorder of vestibular function, unspecified ear: Secondary | ICD-10-CM | POA: Insufficient documentation

## 2024-02-02 DIAGNOSIS — M810 Age-related osteoporosis without current pathological fracture: Secondary | ICD-10-CM

## 2024-02-02 DIAGNOSIS — Z780 Asymptomatic menopausal state: Secondary | ICD-10-CM | POA: Diagnosis not present

## 2024-02-02 DIAGNOSIS — E785 Hyperlipidemia, unspecified: Secondary | ICD-10-CM | POA: Diagnosis not present

## 2024-02-02 DIAGNOSIS — M8580 Other specified disorders of bone density and structure, unspecified site: Secondary | ICD-10-CM | POA: Diagnosis not present

## 2024-02-02 DIAGNOSIS — R7303 Prediabetes: Secondary | ICD-10-CM

## 2024-02-02 DIAGNOSIS — Z23 Encounter for immunization: Secondary | ICD-10-CM | POA: Diagnosis not present

## 2024-02-02 DIAGNOSIS — Z0000 Encounter for general adult medical examination without abnormal findings: Secondary | ICD-10-CM | POA: Diagnosis not present

## 2024-02-02 NOTE — Assessment & Plan Note (Signed)
 Completed PT, still doing exercises at home.  She continues to use meclizine  prn.

## 2024-02-02 NOTE — Assessment & Plan Note (Addendum)
 Lab Results  Component Value Date   HGBA1C 6.4 10/15/2023   HGBA1C 6.4 06/10/2023   HGBA1C 6.3 12/14/2022   Lab Results  Component Value Date   LDLCALC 74 12/14/2022   CREATININE 1.02 10/15/2023   Continues diabetic diet.

## 2024-02-02 NOTE — Assessment & Plan Note (Signed)
 She will update dexa at [facility].

## 2024-02-02 NOTE — Assessment & Plan Note (Signed)
 Saw dermatology, she was placed back HRT- using a compounded estrogen cream.

## 2024-02-02 NOTE — Patient Instructions (Addendum)
 Ms. Roeper,  Thank you for taking the time for your Medicare Wellness Visit. I appreciate your continued commitment to your health goals. Please review the care plan we discussed, and feel free to reach out if I can assist you further.  Please note that Annual Wellness Visits do not include a physical exam. Some assessments may be limited, especially if the visit was conducted virtually. If needed, we may recommend an in-person follow-up with your provider.  Ongoing Care Seeing your primary care provider every 3 to 6 months helps us  monitor your health and provide consistent, personalized care.   Eleanor Ponto, NP: 02/02/24 2:20pm Annual Wellness Visit: 02/06/25 1:40pm  Referrals If a referral was made during today's visit and you haven't received any updates within two weeks, please contact the referred provider directly to check on the status.  Bone density ([facility]): : [phone]  Recommended Screenings:  You will need to get the following vaccines at your local pharmacy: Shingles  Health Maintenance  Topic Date Due   Zoster (Shingles) Vaccine (1 of 2) 04/14/1970   COVID-19 Vaccine (3 - Pfizer risk series) 07/07/2019   Medicare Annual Wellness Visit  01/03/2023   Flu Shot  10/29/2023   Breast Cancer Screening  04/30/2024   Colon Cancer Screening  10/09/2024   Pneumococcal Vaccine for age over 24  Completed   DEXA scan (bone density measurement)  Completed   Hepatitis C Screening  Completed   Meningitis B Vaccine  Aged Out   DTaP/Tdap/Td vaccine  Discontinued       08/20/2023    7:45 AM  Advanced Directives  Does Patient Have a Medical Advance Directive? No  Once completed and notarized, you may return a copy of your Advanced Directive(s) by either of the following:  Bring a copy of your health care power of attorney and living will to the office to be added to your chart at your convenience. You can mail a copy to [facility] [address]  [place], [place] [address] or email to [email or web address]   Vision: Annual vision screenings are recommended for early detection of glaucoma, cataracts, and diabetic retinopathy. These exams can also reveal signs of chronic conditions such as diabetes and high blood pressure.  Dental: Annual dental screenings help detect early signs of oral cancer, gum disease, and other conditions linked to overall health, including heart disease and diabetes.  Please see the attached documents for additional preventive care recommendations.

## 2024-02-02 NOTE — Patient Instructions (Signed)
 VISIT SUMMARY:  Today, you had a Medicare wellness visit and follow-up on your medications. We discussed your dizziness, osteoporosis, cholesterol levels, hair loss, and prediabetes.  YOUR PLAN:  VESTIBULAR HYPOFUNCTION WITH INTERMITTENT VERTIGO: You have improved after physical therapy but still experience occasional vertigo. -Continue your home exercises. -Use meclizine  as needed. -Consider gradually reducing your use of meclizine .  OSTEOPOROSIS: Your bone density has not been assessed in 3-4 years. -We will order a bone density test at [facility].  HYPERLIPIDEMIA: Your cholesterol levels have not been checked in a year. -We will order a cholesterol test.  HAIR LOSS: Your hair loss has improved with the use of Biest cream. -Continue using Biest cream.  PREDIABETES: Your A1c levels are stable but in the borderline range. -We will order an A1c test. -We will check your kidney function.

## 2024-02-02 NOTE — Assessment & Plan Note (Signed)
 Lab Results  Component Value Date   CHOL 164 12/14/2022   HDL 49.40 12/14/2022   LDLCALC 74 12/14/2022   LDLDIRECT 93.0 02/07/2019   TRIG 203.0 (H) 12/14/2022   CHOLHDL 3 12/14/2022  Update lipid panel, continue crestor .

## 2024-02-02 NOTE — Progress Notes (Signed)
 I connected with  Bonnie Brady on 02/02/24 by a audio enabled telemedicine application and verified that I am speaking with the correct person using two identifiers.  Patient Location: Home  Provider Location: Office/Clinic  I discussed the limitations of evaluation and management by telemedicine. The patient expressed understanding and agreed to proceed.  Subjective:   Bonnie Brady is a 73 y.o. female who presents for a Medicare Annual Wellness Visit.  Allergies (verified) Latex, Tetanus toxoid-containing vaccines, and Nickel   History: Past Medical History:  Diagnosis Date   Borderline diabetes    COVID-19 10/2018   Hyperlipidemia    Kidney stone    Low frequency hearing loss, left 2025   Osteopenia    Tachycardia    normal cardiology workup, no palpitations in 5-6 yrs   Tinnitus 2025   Varicose veins    Vestibular hypofunction of left ear 2025   Past Surgical History:  Procedure Laterality Date   ABDOMINAL HYSTERECTOMY     APPENDECTOMY  1971   bladder strap  2006   CESAREAN SECTION  1978 and 1979   laser of veins  2010   also in 2016 and 2017   MASS EXCISION Right 03/13/2020   Procedure: EXCISION OF RIGHT HIP MASS;  Surgeon: Ebbie Cough, MD;  Location: [facility];  Service: General;  Laterality: Right;   TONSILLECTOMY  1958   Family History  Problem Relation Age of Onset   Stroke Mother    COPD Father    COPD Brother    Stroke Maternal Grandfather    Breast cancer Paternal Grandmother    Breast cancer Daughter    Social History   Occupational History   Not on file  Tobacco Use   Smoking status: Former    Current packs/day: 0.00    Types: Cigarettes    Quit date: 03/30/2008    Years since quitting: 15.8   Smokeless tobacco: Never  Vaping Use   Vaping status: Never Used  Substance and Sexual Activity   Alcohol use: Yes    Comment: occasional    Drug use: No   Sexual activity: Not on file   Tobacco  Counseling Counseling given: Not Answered  SDOH Screenings   Food Insecurity: No Food Insecurity (02/01/2024)  Housing: Low Risk  (02/01/2024)  Transportation Needs: No Transportation Needs (02/01/2024)  Utilities: Not At Risk (02/02/2024)  Alcohol Screen: Low Risk  (02/01/2024)  Depression (PHQ2-9): Low Risk  (02/02/2024)  Financial Resource Strain: Low Risk  (02/01/2024)  Physical Activity: Insufficiently Active (02/01/2024)  Social Connections: Socially Integrated (02/01/2024)  Stress: No Stress Concern Present (02/01/2024)  Tobacco Use: Medium Risk (02/02/2024)  Health Literacy: Adequate Health Literacy (02/02/2024)   Depression Screen    02/02/2024    1:51 PM 08/28/2022    9:18 AM 01/02/2022    9:12 AM 11/22/2020    9:56 AM 09/15/2019   10:31 AM 09/20/2017    2:50 PM 09/24/2016    4:32 PM  PHQ 2/9 Scores  PHQ - 2 Score 0 0 0 0 0 0 0  PHQ- 9 Score 0 0    0 0     Goals Addressed   None    Visit info / Clinical Intake: Medicare Wellness Visit Type:: Subsequent Annual Wellness Visit Medicare Wellness Visit Mode:: In-person (required for WTM) Interpreter Needed?: No Pre-visit prep was completed: yes AWV questionnaire completed by patient prior to visit?: yes Date:: 02/01/24 Living arrangements:: lives with spouse/significant other Patient's Overall Health Status  Rating: excellent Typical amount of pain: none Does pain affect daily life?: no Are you currently prescribed opioids?: no  Dietary Habits and Nutritional Risks Eats fruit and vegetables daily?: yes Most meals are obtained by: preparing own meals Diabetic:: no  Functional Status Activities of Daily Living (to include ambulation/medication): (Patient-Rptd) Independent Ambulation: (Patient-Rptd) Independent Medication Administration: Independent Home Management: (Patient-Rptd) Independent Manage your own finances?: yes Primary transportation is: driving Concerns about vision?: no *vision screening is required for  WTM* Concerns about hearing?: no  Fall Screening Falls in the past year?: (Patient-Rptd) 0 Number of falls in past year: 0 Was there an injury with Fall?: 0 Fall Risk Category Calculator: 0 Patient Fall Risk Level: Low Fall Risk  Fall Risk Patient at Risk for Falls Due to: Impaired balance/gait Fall risk Follow up: Education provided  Home and Transportation Safety: All rugs have non-skid backing?: yes All stairs or steps have railings?: (!) no Grab bars in the bathtub or shower?: (!) no Have non-skid surface in bathtub or shower?: yes Good home lighting?: yes Regular seat belt use?: yes Hospital stays in the last year:: no  Cognitive Assessment Difficulty concentrating, remembering, or making decisions? : no Will 6CIT or Mini Cog be Completed: yes What year is it?: 0 points What month is it?: 0 points Give patient an address phrase to remember (5 components): [address] [place]  About what time is it?: 0 points Count backwards from 20 to 1: 0 points Say the months of the year in reverse: 0 points Repeat the address phrase from earlier: 0 points 6 CIT Score: 0 points  Advance Directives (For Healthcare) Does Patient Have a Medical Advance Directive?: No Would patient like information on creating a medical advance directive?: No - Patient declined  Reviewed/Updated  Reviewed/Updated: All        Objective:    Today's Vitals   02/02/24 1335  BP: 123/78  Pulse: 73  Resp: 16  Temp: 97.8 F (36.6 C)  TempSrc: Oral  SpO2: 95%  Weight: 173 lb 12.8 oz (78.8 kg)  Height: 5' 6 (1.676 m)   Body mass index is 28.05 kg/m.  Current Medications (verified) Outpatient Encounter Medications as of 02/02/2024  Medication Sig   aspirin EC 81 MG tablet Take 81 mg by mouth daily. Swallow whole.   Azelastine  HCl 137 MCG/SPRAY SOLN Place 1-2 sprays into both nostrils 2 (two) times daily as needed.   Estradiol-Estriol-Progesterone (BI-EST 80:20 PROGESTERONE TD)  BI-EST   meclizine  (ANTIVERT ) 25 MG tablet Take 1 tablet (25 mg total) by mouth 3 (three) times daily as needed for dizziness or nausea.   Nutritional Supplements (JUICE PLUS FIBRE PO) Take 4 tablets by mouth in the morning and at bedtime.   Omega-3 Fatty Acids (OMEGA 3 PO) Take 1 capsule by mouth in the morning and at bedtime.   rosuvastatin  (CRESTOR ) 20 MG tablet Take 1 tablet (20 mg total) by mouth daily.   No facility-administered encounter medications on file as of 02/02/2024.   Hearing/Vision screen Hearing Screening - Comments:: Has low frequency hearing loss in left ear. Tinnitus Vision Screening - Comments:: Up to date with routine eye exams with Dr Mevelyn Immunizations and Health Maintenance Health Maintenance  Topic Date Due   Zoster Vaccines- Shingrix  (1 of 2) 04/14/1970   COVID-19 Vaccine (3 - Pfizer risk series) 02/01/2025 (Originally 07/07/2019)   Mammogram  04/30/2024   Colonoscopy  10/09/2024   Medicare Annual Wellness (AWV)  02/01/2025   Pneumococcal Vaccine: 50+ Years  Completed   Influenza Vaccine  Completed   DEXA SCAN  Completed   Hepatitis C Screening  Completed   Meningococcal B Vaccine  Aged Out   DTaP/Tdap/Td  Discontinued        Assessment/Plan:  This is a routine wellness examination for Bonnie Brady.  Patient Care Team: Daryl Setter, NP as PCP - General (Internal Medicine) Rutherford Gain, MD as Consulting Physician (Obstetrics and Gynecology) Camella Fallow, MD as Consulting Physician (Orthopedic Surgery) Court Dorn PARAS, MD as Consulting Physician (Cardiology) Kristie Lamprey, MD as Consulting Physician (Gastroenterology)  I have personally reviewed and noted the following in the patient's chart:   Medical and social history Use of alcohol, tobacco or illicit drugs  Current medications and supplements including opioid prescriptions. Functional ability and status Nutritional status Physical activity Advanced directives List of other  physicians Hospitalizations, surgeries, and ER visits in previous 12 months Vitals Screenings to include cognitive, depression, and falls Referrals and appointments  Orders Placed This Encounter  Procedures   DG Bone Density    Standing Status:   Future    Expiration Date:   02/01/2025    Scheduling Instructions:     [facility], last one there 2021    Reason for Exam (SYMPTOM  OR DIAGNOSIS REQUIRED):   osteopenia, post menopausal estrogen deficiency    Preferred imaging location?:   External   Flu vaccine HIGH DOSE PF(Fluzone Trivalent)   In addition, I have reviewed and discussed with patient certain preventive protocols, quality metrics, and best practice recommendations. A written personalized care plan for preventive services as well as general preventive health recommendations were provided to patient.   Lolita Libra, CMA   02/02/2024   Return in 1 year (on 02/01/2025).  After Visit Summary: (In Person-Printed) AVS printed and given to the patient  Nurse Notes: nothing significant to report

## 2024-02-02 NOTE — Progress Notes (Signed)
 Subjective:     Patient ID: Bonnie Brady, female    DOB: 1950/07/12, 73 y.o.   MRN: [id number]  No chief complaint on file.   HPI  Discussed the use of AI scribe software for clinical note transcription with the patient, who gave verbal consent to proceed.  History of Present Illness  Bonnie Brady is a 73 year old female who presents for a Medicare wellness visit and follow-up on her medications.  Dizziness due to unilateral vestibular hypofunction has improved after eight weeks of physical therapy. She continues home exercises and experiences intermittent vertigo, lasting about a day and a half, with up to two weeks without episodes. Meclizine  is used as needed.  She takes Crestor  and has resumed hormone replacement therapy with compounded Biest cream, which has significantly helped with hair loss attributed to hormonal changes.  Her last A1c in July was borderline at 6.3 to 6.4. She received the flu shot this year and had the shingles vaccine, likely Zostavax, years ago.     Health Maintenance Due  Topic Date Due   Zoster Vaccines- Shingrix  (1 of 2) 04/14/1970    Past Medical History:  Diagnosis Date   Borderline diabetes    COVID-19 10/2018   Hyperlipidemia    Kidney stone    Low frequency hearing loss, left 2025   Osteopenia    Tachycardia    normal cardiology workup, no palpitations in 5-6 yrs   Tinnitus 2025   Varicose veins    Vestibular hypofunction of left ear 2025    Past Surgical History:  Procedure Laterality Date   ABDOMINAL HYSTERECTOMY     APPENDECTOMY  1971   bladder strap  2006   CESAREAN SECTION  1978 and 1979   laser of veins  2010   also in 2016 and 2017   MASS EXCISION Right 03/13/2020   Procedure: EXCISION OF RIGHT HIP MASS;  Surgeon: Ebbie Cough, MD;  Location: [facility];  Service: General;  Laterality: Right;   TONSILLECTOMY  1958    Family History  Problem Relation Age of Onset   Stroke Mother    COPD  Father    COPD Brother    Stroke Maternal Grandfather    Breast cancer Paternal Grandmother    Breast cancer Daughter     Social History   Socioeconomic History   Marital status: Married    Spouse name: Not on file   Number of children: Not on file   Years of education: Not on file   Highest education level: Associate degree: occupational, scientist, product/process development, or vocational program  Occupational History   Not on file  Tobacco Use   Smoking status: Former    Current packs/day: 0.00    Types: Cigarettes    Quit date: 03/30/2008    Years since quitting: 15.8   Smokeless tobacco: Never  Vaping Use   Vaping status: Never Used  Substance and Sexual Activity   Alcohol use: Yes    Comment: occasional    Drug use: No   Sexual activity: Not on file  Other Topics Concern   Not on file  Social History Narrative   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   Systems Developer 1 cup of coffee in AM and occ soda   Social Drivers of Health   Financial Resource Strain: Low Risk  (02/01/2024)  Overall Financial Resource Strain (CARDIA)    Difficulty of Paying Living Expenses: Not hard at all  Food Insecurity: No Food Insecurity (02/01/2024)   Hunger Vital Sign    Worried About Running Out of Food in the Last Year: Never true    Ran Out of Food in the Last Year: Never true  Transportation Needs: No Transportation Needs (02/01/2024)   PRAPARE - Administrator, Civil Service (Medical): No    Lack of Transportation (Non-Medical): No  Physical Activity: Insufficiently Active (02/01/2024)   Exercise Vital Sign    Days of Exercise per Week: 3 days    Minutes of Exercise per Session: 30 min  Stress: No Stress Concern Present (02/01/2024)   [employer] of Occupational Health - Occupational Stress Questionnaire    Feeling of Stress: Not at all  Social Connections: Socially Integrated  (02/01/2024)   Social Connection and Isolation Panel    Frequency of Communication with Friends and Family: More than three times a week    Frequency of Social Gatherings with Friends and Family: Three times a week    Attends Religious Services: More than 4 times per year    Active Member of Clubs or Organizations: Yes    Attends Banker Meetings: More than 4 times per year    Marital Status: Married  Catering Manager Violence: Not At Risk (02/02/2024)   Humiliation, Afraid, Rape, and Kick questionnaire    Fear of Current or Ex-Partner: No    Emotionally Abused: No    Physically Abused: No    Sexually Abused: No    Outpatient Medications Prior to Visit  Medication Sig Dispense Refill   aspirin EC 81 MG tablet Take 81 mg by mouth daily. Swallow whole.     Azelastine  HCl 137 MCG/SPRAY SOLN Place 1-2 sprays into both nostrils 2 (two) times daily as needed.     Estradiol-Estriol-Progesterone (BI-EST 80:20 PROGESTERONE TD) BI-EST     meclizine  (ANTIVERT ) 25 MG tablet Take 1 tablet (25 mg total) by mouth 3 (three) times daily as needed for dizziness or nausea. 30 tablet 1   Nutritional Supplements (JUICE PLUS FIBRE PO) Take 4 tablets by mouth in the morning and at bedtime.     Omega-3 Fatty Acids (OMEGA 3 PO) Take 1 capsule by mouth in the morning and at bedtime.     rosuvastatin  (CRESTOR ) 20 MG tablet Take 1 tablet (20 mg total) by mouth daily. 90 tablet 1   No facility-administered medications prior to visit.    Allergies  Allergen Reactions   Latex Rash    Contact with powder.   Tetanus Toxoid-Containing Vaccines Other (See Comments)    Swelling / redness   Nickel Rash    Blisters    ROS See HPI    Objective:    Physical Exam Constitutional:      Appearance: Normal appearance. She is well-developed.  Cardiovascular:     Rate and Rhythm: Normal rate and regular rhythm.     Heart sounds: Normal heart sounds. No murmur heard. Pulmonary:     Effort: Pulmonary  effort is normal. No respiratory distress.     Breath sounds: Normal breath sounds. No wheezing.  Skin:    General: Skin is warm and dry.  Neurological:     Mental Status: She is alert and oriented to person, place, and time.  Psychiatric:        Behavior: Behavior normal.        Thought Content: Thought content  normal.        Judgment: Judgment normal.      BP 123/78   Pulse 73   Temp 97.8 F (36.6 C)   Ht 5' 5.5 (1.664 m)   Wt 173 lb 12.8 oz (78.8 kg)   BMI 28.48 kg/m  Wt Readings from Last 3 Encounters:  02/02/24 173 lb 12.8 oz (78.8 kg)  02/02/24 173 lb 12.8 oz (78.8 kg)  10/15/23 174 lb 3.2 oz (79 kg)       Assessment & Plan:   Problem List Items Addressed This Visit       Unprioritized   Vestibular dysfunction - Primary   Completed PT, still doing exercises at home.  She continues to use meclizine  prn.       Osteoporosis   She will update dexa at [facility].        Hyperlipidemia   Lab Results  Component Value Date   CHOL 164 12/14/2022   HDL 49.40 12/14/2022   LDLCALC 74 12/14/2022   LDLDIRECT 93.0 02/07/2019   TRIG 203.0 (H) 12/14/2022   CHOLHDL 3 12/14/2022  Update lipid panel, continue crestor .        Relevant Orders   Lipid panel   Hair loss   Saw dermatology, she was placed back HRT- using a compounded estrogen cream.       Borderline diabetes   Lab Results  Component Value Date   HGBA1C 6.4 10/15/2023   HGBA1C 6.4 06/10/2023   HGBA1C 6.3 12/14/2022   Lab Results  Component Value Date   LDLCALC 74 12/14/2022   CREATININE 1.02 10/15/2023   Continues diabetic diet.        Relevant Orders   Basic metabolic panel with GFR   HgB J8r    I am having Bonnie RONAL Brady maintain her Nutritional Supplements (JUICE PLUS FIBRE PO), Omega-3 Fatty Acids (OMEGA 3 PO), aspirin EC, Estradiol-Estriol-Progesterone (BI-EST 80:20 PROGESTERONE TD), meclizine , rosuvastatin , and Azelastine  HCl.  No orders of the defined types were placed in this  encounter.

## 2024-02-03 ENCOUNTER — Ambulatory Visit: Payer: Self-pay | Admitting: Family

## 2024-02-03 LAB — BASIC METABOLIC PANEL WITH GFR
BUN: 18 mg/dL (ref 6–23)
CO2: 26 meq/L (ref 19–32)
Calcium: 9.7 mg/dL (ref 8.4–10.5)
Chloride: 106 meq/L (ref 96–112)
Creatinine, Ser: 1.11 mg/dL (ref 0.40–1.20)
GFR: 49.56 mL/min — ABNORMAL LOW (ref >60.00)
Glucose, Bld: 82 mg/dL (ref 70–99)
Potassium: 4.3 meq/L (ref 3.5–5.1)
Sodium: 143 meq/L (ref 135–145)

## 2024-02-03 LAB — LIPID PANEL
Cholesterol: 134 mg/dL (ref <200)
HDL: 49 mg/dL — ABNORMAL LOW (ref >=50)
LDL Cholesterol (Calc): 56 mg/dL
Non-HDL Cholesterol (Calc): 85 mg/dL (ref <130)
Total CHOL/HDL Ratio: 2.7 (calc) (ref <5.0)
Triglycerides: 219 mg/dL — ABNORMAL HIGH (ref <150)

## 2024-02-03 LAB — HEMOGLOBIN A1C: Hgb A1c MFr Bld: 6.1 % (ref 4.6–6.5)

## 2024-02-22 ENCOUNTER — Telehealth: Payer: Self-pay

## 2024-02-22 NOTE — Telephone Encounter (Signed)
 Lvm for [facility] at ENT. Message details to let her know we have not been prescriving hydrochlorothiazide for patient and to let us  konow how we can help/ what do they need.  E2C2 please transfer to office

## 2024-02-22 NOTE — Telephone Encounter (Signed)
 We are not prescribing hydrochlorothiazide.  Is ENT prescribing? If so, what are they wanting/needing from us ?

## 2024-02-22 NOTE — Telephone Encounter (Signed)
 Copied from CRM #[id number]. Topic: General - Call Back - No Documentation >> Feb 22, 2024  1:00 PM Mercedes MATSU wrote: Reason for CRM: Particia from [facility] called in stating she sent a request over back in August for a patients hydrochlorothiazide. Is requesting a urgent call back to [phone]

## 2024-02-22 NOTE — Telephone Encounter (Signed)
 ENT Needed clearance to start patient on hydrochlorothiazide as part of vestibular rehab. Per Eleanor Ponto, ok to start medication.  They will check bmp in 2 weeks and patient will monitor BP at home.

## 2024-02-22 NOTE — Telephone Encounter (Signed)
 LMOM for Bonnie Brady at [facility]- needing more information - asking for call back.

## 2024-02-22 NOTE — Telephone Encounter (Signed)
Please advise.  I do not see this medication on her list.

## 2024-02-28 ENCOUNTER — Ambulatory Visit: Payer: Self-pay

## 2024-02-28 NOTE — Telephone Encounter (Signed)
   FYI Only or Action Required?: FYI only for provider: appointment scheduled on 02/29/2024.  Patient was last seen in primary care on 02/02/2024 by Daryl Setter, NP.  Called Nurse Triage reporting Sore Throat.  Symptoms began several days ago.  Interventions attempted: Rest, hydration, or home remedies.  Symptoms are: unchanged.  Triage Disposition: See Physician Within 24 Hours  Patient/caregiver understands and will follow disposition?: YesCopied from CRM #[id number]. Topic: Clinical - Red Word Triage >> Feb 28, 2024  9:13 AM [name] wrote: Red Word that prompted transfer to Nurse Triage: Pt called in stating that she has a sore throat. Pt stated she isnt sure if she has a fever because she hasn't checked it. Pt denied having any other symptoms at this time. Warm transfer to nurse triage. Reason for Disposition  SEVERE throat pain (e.g., excruciating)  Answer Assessment - Initial Assessment Questions 1. ONSET: When did the throat start hurting? (Hours or days ago)      Saturday, two days ago 2. SEVERITY: How bad is the sore throat? (Scale 1-10; mild, moderate or severe)     Hurts to swallow, 7/10 3. STREP EXPOSURE: Has there been any exposure to strep within the past week? If Yes, ask: What type of contact occurred?      denies 4.  VIRAL SYMPTOMS: Are there any symptoms of a cold, such as a runny nose, cough, hoarse voice or red eyes?      denies 5. FEVER: Do you have a fever? If Yes, ask: What is your temperature, how was it measured, and when did it start?     Denies measured fever, but does have chills 6. PUS ON THE TONSILS: Is there pus on the tonsils in the back of your throat?     denies 7. OTHER SYMPTOMS: Do you have any other symptoms? (e.g., difficulty breathing, headache, rash)     headache  Protocols used: Sore Throat-A-AH

## 2024-02-29 ENCOUNTER — Ambulatory Visit: Admitting: Medical

## 2024-02-29 VITALS — BP 124/82 | HR 82 | Temp 97.8°F | Resp 15 | Ht 65.5 in | Wt 172.8 lb

## 2024-02-29 DIAGNOSIS — M791 Myalgia, unspecified site: Secondary | ICD-10-CM | POA: Diagnosis not present

## 2024-02-29 DIAGNOSIS — J029 Acute pharyngitis, unspecified: Secondary | ICD-10-CM | POA: Diagnosis not present

## 2024-02-29 LAB — POCT INFLUENZA A/B
Influenza A, POC: NEGATIVE
Influenza B, POC: NEGATIVE

## 2024-02-29 LAB — POCT RAPID STREP A (OFFICE): Rapid Strep A Screen: NEGATIVE

## 2024-02-29 LAB — POC COVID19 BINAXNOW: SARS Coronavirus 2 Ag: NEGATIVE

## 2024-02-29 MED ORDER — BENZONATATE 100 MG PO CAPS: 100.0000 mg | ORAL_CAPSULE | Freq: Three times a day (TID) | ORAL | 0 refills | Status: AC | PRN

## 2024-02-29 MED ORDER — AZITHROMYCIN 250 MG PO TABS: ORAL_TABLET | ORAL | 0 refills | Status: AC

## 2024-02-29 NOTE — Progress Notes (Signed)
 Subjective:    Patient ID: Bonnie Brady, female    DOB: [birth year], 73 y.o.   MRN: [id number]  HPI  Bonnie Brady is a 73 year old female who presents with a sore throat and dry cough.  She has had a sore throat since Saturday with peak pain 10/10 over the weekend, now 7/10. Lozenges and ibuprofen give partial relief.  She has an occasional nonproductive cough that does not disturb sleep. She denies shortness of breath, wheezing, neck pain, or sinus pain.  On Saturday she had body aches and chills without documented fever. She now has no fever, chills, sweats, or body aches.  She has vestibular hypofunction with intermittent nonpainful pressure in the left ear.  She often has sinus infections but notes this illness began with sore throat rather than sinus symptoms, which is unusual for her, and she has no sinus pain or respiratory distress currently.      Review of Systems  Constitutional:  Positive for chills. Negative for fever.  HENT:  Positive for sore throat. Negative for dental problem, ear discharge and sinus pressure.   Respiratory:  Positive for cough. Negative for shortness of breath and wheezing.   Cardiovascular:  Negative for palpitations.  Gastrointestinal:  Negative for abdominal pain and constipation.  Musculoskeletal:  Negative for back pain, myalgias and neck pain.       See hpi  Neurological:  Negative for dizziness and light-headedness.  Hematological:  Negative for adenopathy.  Psychiatric/Behavioral:  Negative for behavioral problems.      Past Medical History:  Diagnosis Date   Borderline diabetes    COVID-19 10/2018   Hyperlipidemia    Kidney stone    Low frequency hearing loss, left 2025   Osteopenia    Tachycardia    normal cardiology workup, no palpitations in 5-6 yrs   Tinnitus 2025   Varicose veins    Vestibular hypofunction of left ear 2025     Social History   Socioeconomic History   Marital status: Married    Spouse name:  Not on file   Number of children: Not on file   Years of education: Not on file   Highest education level: Associate degree: occupational, scientist, product/process development, or vocational program  Occupational History   Not on file  Tobacco Use   Smoking status: Former    Current packs/day: 0.00    Types: Cigarettes    Quit date: 03/30/2008    Years since quitting: 15.9   Smokeless tobacco: Never  Vaping Use   Vaping status: Never Used  Substance and Sexual Activity   Alcohol use: Yes    Comment: occasional    Drug use: No   Sexual activity: Not on file  Other Topics Concern   Not on file  Social History Narrative   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   Systems Developer 1 cup of coffee in AM and occ soda   Social Drivers of Corporate Investment Banker Strain: Low Risk  (02/01/2024)   Overall Financial Resource Strain (CARDIA)    Difficulty of Paying Living Expenses: Not hard at all  Food Insecurity: No Food Insecurity (02/01/2024)   Hunger Vital Sign    Worried About Running Out of Food in the Last Year: Never true    Ran Out of Food in the Last Year: Never true  Transportation Needs: No  Transportation Needs (02/01/2024)   PRAPARE - Administrator, Civil Service (Medical): No    Lack of Transportation (Non-Medical): No  Physical Activity: Insufficiently Active (02/01/2024)   Exercise Vital Sign    Days of Exercise per Week: 3 days    Minutes of Exercise per Session: 30 min  Stress: No Stress Concern Present (02/01/2024)   [employer] of Occupational Health - Occupational Stress Questionnaire    Feeling of Stress: Not at all  Social Connections: Socially Integrated (02/01/2024)   Social Connection and Isolation Panel    Frequency of Communication with Friends and Family: More than three times a week    Frequency of Social Gatherings with Friends and Family: Three times a week     Attends Religious Services: More than 4 times per year    Active Member of Clubs or Organizations: Yes    Attends Banker Meetings: More than 4 times per year    Marital Status: Married  Catering Manager Violence: Not At Risk (02/02/2024)   Humiliation, Afraid, Rape, and Kick questionnaire    Fear of Current or Ex-Partner: No    Emotionally Abused: No    Physically Abused: No    Sexually Abused: No    Past Surgical History:  Procedure Laterality Date   ABDOMINAL HYSTERECTOMY     APPENDECTOMY  1971   bladder strap  2006   CESAREAN SECTION  1978 and 1979   laser of veins  2010   also in 2016 and 2017   MASS EXCISION Right 03/13/2020   Procedure: EXCISION OF RIGHT HIP MASS;  Surgeon: Ebbie Cough, MD;  Location: [facility];  Service: General;  Laterality: Right;   TONSILLECTOMY  1958    Family History  Problem Relation Age of Onset   Stroke Mother    COPD Father    COPD Brother    Stroke Maternal Grandfather    Breast cancer Paternal Grandmother    Breast cancer Daughter     Allergies  Allergen Reactions   Latex Rash    Contact with powder.   Tetanus Toxoid-Containing Vaccines Other (See Comments)    Swelling / redness   Nickel Rash    Blisters    Current Outpatient Medications on File Prior to Visit  Medication Sig Dispense Refill   aspirin EC 81 MG tablet Take 81 mg by mouth daily. Swallow whole.     Azelastine  HCl 137 MCG/SPRAY SOLN Place 1-2 sprays into both nostrils 2 (two) times daily as needed.     Estradiol-Estriol-Progesterone (BI-EST 80:20 PROGESTERONE TD) BI-EST     meclizine  (ANTIVERT ) 25 MG tablet Take 1 tablet (25 mg total) by mouth 3 (three) times daily as needed for dizziness or nausea. 30 tablet 1   Nutritional Supplements (JUICE PLUS FIBRE PO) Take 4 tablets by mouth in the morning and at bedtime.     Omega-3 Fatty Acids (OMEGA 3 PO) Take 1 capsule by mouth in the morning and at bedtime.     rosuvastatin  (CRESTOR ) 20  MG tablet Take 1 tablet (20 mg total) by mouth daily. 90 tablet 1   No current facility-administered medications on file prior to visit.    BP 124/82   Pulse 82   Temp 97.8 F (36.6 C) (Oral)   Resp 15   Ht 5' 5.5 (1.664 m)   Wt 172 lb 12.8 oz (78.4 kg)   SpO2 96%   BMI 28.32 kg/m  Objective:   Physical Exam  General- No acute distress. Pleasant patient. Neck- Full range of motion, no jvd Lungs- Clear, even and unlabored. Heart- regular rate and rhythm. Neurologic- CNII- XII grossly intact.       Assessment & Plan:  Acute pharyngitis with chills and myalgias early on. Severe sore throat with negative rapid tests for COVID and flu.  -flu and covid negative. -concerned for false negative rapid strep  - Prescribed azithromycin  5-day course. - Prescribed benzonatate  for cough if needed. -not mild pinkish red appearance to left tm. Azithromycin  has some coverage for ear as well.  Follow up in 7-10 days or sooner if needed  [employer], PA-C

## 2024-02-29 NOTE — Patient Instructions (Addendum)
 Acute pharyngitis with chills and myalgias early on. Severe sore throat with negative rapid tests for COVID and flu.  -flu and covid negative. -concerned for false negative rapid strep  - Prescribed azithromycin  5-day course. - Prescribed benzonatate  for cough if needed. -not mild pinkish red appearance to left tm. Azithromycin  has some coverage for ear as well.  Follow up in 7-10 days or sooner if needed

## 2024-03-03 DIAGNOSIS — J301 Allergic rhinitis due to pollen: Secondary | ICD-10-CM | POA: Diagnosis not present

## 2024-03-03 DIAGNOSIS — H8101 Meniere's disease, right ear: Secondary | ICD-10-CM | POA: Diagnosis not present

## 2025-02-06 ENCOUNTER — Ambulatory Visit
